# Patient Record
Sex: Female | Born: 1935
Health system: Southern US, Community
[De-identification: ages and names within clinical notes are randomized; demographics above are authoritative.]

## PROBLEM LIST (undated history)

## (undated) DIAGNOSIS — E669 Obesity, unspecified: Secondary | ICD-10-CM

## (undated) DIAGNOSIS — IMO0001 Reserved for inherently not codable concepts without codable children: Secondary | ICD-10-CM

## (undated) DIAGNOSIS — E039 Hypothyroidism, unspecified: Secondary | ICD-10-CM

## (undated) DIAGNOSIS — S72309A Unspecified fracture of shaft of unspecified femur, initial encounter for closed fracture: Secondary | ICD-10-CM

## (undated) DIAGNOSIS — M722 Plantar fascial fibromatosis: Secondary | ICD-10-CM

## (undated) DIAGNOSIS — J45901 Unspecified asthma with (acute) exacerbation: Secondary | ICD-10-CM

## (undated) DIAGNOSIS — M779 Enthesopathy, unspecified: Secondary | ICD-10-CM

## (undated) DIAGNOSIS — E041 Nontoxic single thyroid nodule: Secondary | ICD-10-CM

## (undated) DIAGNOSIS — R269 Unspecified abnormalities of gait and mobility: Secondary | ICD-10-CM

## (undated) DIAGNOSIS — M810 Age-related osteoporosis without current pathological fracture: Secondary | ICD-10-CM

## (undated) DIAGNOSIS — R7989 Other specified abnormal findings of blood chemistry: Secondary | ICD-10-CM

## (undated) DIAGNOSIS — Z90710 Acquired absence of both cervix and uterus: Secondary | ICD-10-CM

## (undated) DIAGNOSIS — M25579 Pain in unspecified ankle and joints of unspecified foot: Secondary | ICD-10-CM

## (undated) DIAGNOSIS — R5383 Other fatigue: Secondary | ICD-10-CM

## (undated) DIAGNOSIS — I10 Essential (primary) hypertension: Secondary | ICD-10-CM

## (undated) DIAGNOSIS — R0602 Shortness of breath: Secondary | ICD-10-CM

## (undated) DIAGNOSIS — E079 Disorder of thyroid, unspecified: Secondary | ICD-10-CM

## (undated) DIAGNOSIS — R942 Abnormal results of pulmonary function studies: Secondary | ICD-10-CM

## (undated) DIAGNOSIS — I341 Nonrheumatic mitral (valve) prolapse: Secondary | ICD-10-CM

## (undated) DIAGNOSIS — M1990 Unspecified osteoarthritis, unspecified site: Secondary | ICD-10-CM

## (undated) DIAGNOSIS — I358 Other nonrheumatic aortic valve disorders: Secondary | ICD-10-CM

## (undated) DIAGNOSIS — E785 Hyperlipidemia, unspecified: Secondary | ICD-10-CM

## (undated) DIAGNOSIS — I4819 Other persistent atrial fibrillation: Secondary | ICD-10-CM

## (undated) HISTORY — DX: Nontoxic single thyroid nodule: E04.1

## (undated) HISTORY — DX: Other specified abnormal findings of blood chemistry: R79.89

## (undated) HISTORY — PX: TONSILLECTOMY: SUR1361

## (undated) HISTORY — DX: Reserved for inherently not codable concepts without codable children: IMO0001

## (undated) HISTORY — DX: Unspecified abnormalities of gait and mobility: R26.9

## (undated) HISTORY — DX: Plantar fascial fibromatosis: M72.2

## (undated) HISTORY — DX: Other nonrheumatic aortic valve disorders: I35.8

## (undated) HISTORY — DX: Age-related osteoporosis without current pathological fracture: M81.0

## (undated) HISTORY — DX: Nonrheumatic mitral (valve) prolapse: I34.1

## (undated) HISTORY — DX: Pain in unspecified ankle and joints of unspecified foot: M25.579

## (undated) HISTORY — DX: Obesity, unspecified: E66.9

## (undated) HISTORY — DX: Hypothyroidism, unspecified: E03.9

## (undated) HISTORY — DX: Abnormal results of pulmonary function studies: R94.2

## (undated) HISTORY — DX: Hyperlipidemia, unspecified: E78.5

## (undated) HISTORY — DX: Essential (primary) hypertension: I10

## (undated) HISTORY — DX: Unspecified fracture of shaft of unspecified femur, initial encounter for closed fracture: S72.309A

## (undated) HISTORY — DX: Disorder of thyroid, unspecified: E07.9

## (undated) HISTORY — DX: Unspecified osteoarthritis, unspecified site: M19.90

## (undated) HISTORY — DX: Other fatigue: R53.83

## (undated) HISTORY — DX: Acquired absence of both cervix and uterus: Z90.710

## (undated) HISTORY — DX: Shortness of breath: R06.02

## (undated) HISTORY — DX: Unspecified asthma with (acute) exacerbation: J45.901

## (undated) HISTORY — DX: Other persistent atrial fibrillation: I48.19

## (undated) HISTORY — DX: Enthesopathy, unspecified: M77.9

---

## 1974-08-19 HISTORY — PX: OTHER SURGICAL HISTORY: SHX169

## 1974-08-19 HISTORY — PX: FRACTURE SURGERY: SHX138

## 1998-08-10 ENCOUNTER — Ambulatory Visit (HOSPITAL_COMMUNITY): Admission: RE | Admit: 1998-08-10 | Discharge: 1998-08-10 | Payer: Self-pay | Admitting: Internal Medicine

## 1998-08-10 ENCOUNTER — Encounter (HOSPITAL_BASED_OUTPATIENT_CLINIC_OR_DEPARTMENT_OTHER): Payer: Self-pay | Admitting: Internal Medicine

## 1998-08-28 ENCOUNTER — Other Ambulatory Visit: Admission: RE | Admit: 1998-08-28 | Discharge: 1998-08-28 | Payer: Self-pay | Admitting: Internal Medicine

## 1999-08-20 HISTORY — PX: ABDOMINAL HYSTERECTOMY: SHX81

## 1999-09-10 ENCOUNTER — Other Ambulatory Visit: Admission: RE | Admit: 1999-09-10 | Discharge: 1999-09-10 | Payer: Self-pay | Admitting: Internal Medicine

## 1999-09-18 ENCOUNTER — Encounter (HOSPITAL_BASED_OUTPATIENT_CLINIC_OR_DEPARTMENT_OTHER): Payer: Self-pay | Admitting: Internal Medicine

## 1999-09-18 ENCOUNTER — Ambulatory Visit (HOSPITAL_COMMUNITY): Admission: RE | Admit: 1999-09-18 | Discharge: 1999-09-18 | Payer: Self-pay | Admitting: Internal Medicine

## 2000-08-20 ENCOUNTER — Other Ambulatory Visit: Admission: RE | Admit: 2000-08-20 | Discharge: 2000-08-20 | Payer: Self-pay | Admitting: Gynecology

## 2000-08-20 ENCOUNTER — Encounter (INDEPENDENT_AMBULATORY_CARE_PROVIDER_SITE_OTHER): Payer: Self-pay

## 2000-08-26 ENCOUNTER — Ambulatory Visit: Admission: RE | Admit: 2000-08-26 | Discharge: 2000-08-26 | Payer: Self-pay | Admitting: Gynecology

## 2000-09-01 ENCOUNTER — Encounter: Payer: Self-pay | Admitting: Gynecology

## 2000-09-03 ENCOUNTER — Inpatient Hospital Stay (HOSPITAL_COMMUNITY): Admission: RE | Admit: 2000-09-03 | Discharge: 2000-09-05 | Payer: Self-pay | Admitting: Gynecology

## 2000-09-03 ENCOUNTER — Encounter (INDEPENDENT_AMBULATORY_CARE_PROVIDER_SITE_OTHER): Payer: Self-pay | Admitting: *Deleted

## 2000-10-07 ENCOUNTER — Ambulatory Visit (HOSPITAL_COMMUNITY): Admission: RE | Admit: 2000-10-07 | Discharge: 2000-10-07 | Payer: Self-pay | Admitting: Internal Medicine

## 2000-10-07 ENCOUNTER — Encounter (HOSPITAL_BASED_OUTPATIENT_CLINIC_OR_DEPARTMENT_OTHER): Payer: Self-pay | Admitting: Internal Medicine

## 2000-12-14 ENCOUNTER — Emergency Department (HOSPITAL_COMMUNITY): Admission: EM | Admit: 2000-12-14 | Discharge: 2000-12-14 | Payer: Self-pay | Admitting: Emergency Medicine

## 2001-08-19 HISTORY — PX: SKIN CANCER EXCISION: SHX779

## 2004-08-15 DIAGNOSIS — I10 Essential (primary) hypertension: Secondary | ICD-10-CM

## 2004-08-15 DIAGNOSIS — M810 Age-related osteoporosis without current pathological fracture: Secondary | ICD-10-CM

## 2004-08-15 HISTORY — DX: Age-related osteoporosis without current pathological fracture: M81.0

## 2004-08-15 HISTORY — DX: Essential (primary) hypertension: I10

## 2004-08-19 HISTORY — PX: BASAL CELL CARCINOMA EXCISION: SHX1214

## 2005-12-02 DIAGNOSIS — J45901 Unspecified asthma with (acute) exacerbation: Secondary | ICD-10-CM

## 2005-12-02 HISTORY — DX: Unspecified asthma with (acute) exacerbation: J45.901

## 2008-09-27 HISTORY — PX: COLONOSCOPY: SHX174

## 2008-09-27 LAB — HM COLONOSCOPY: HM COLON: NORMAL

## 2008-10-09 DIAGNOSIS — R269 Unspecified abnormalities of gait and mobility: Secondary | ICD-10-CM

## 2008-10-09 HISTORY — DX: Unspecified abnormalities of gait and mobility: R26.9

## 2008-12-28 DIAGNOSIS — M25579 Pain in unspecified ankle and joints of unspecified foot: Secondary | ICD-10-CM

## 2008-12-28 HISTORY — DX: Pain in unspecified ankle and joints of unspecified foot: M25.579

## 2009-03-19 DIAGNOSIS — M779 Enthesopathy, unspecified: Secondary | ICD-10-CM

## 2009-03-19 HISTORY — DX: Enthesopathy, unspecified: M77.9

## 2010-06-25 DIAGNOSIS — R7989 Other specified abnormal findings of blood chemistry: Secondary | ICD-10-CM

## 2010-06-25 HISTORY — DX: Other specified abnormal findings of blood chemistry: R79.89

## 2010-06-28 ENCOUNTER — Encounter: Admission: RE | Admit: 2010-06-28 | Discharge: 2010-06-28 | Payer: Self-pay | Admitting: Cardiology

## 2010-06-28 ENCOUNTER — Ambulatory Visit: Payer: Self-pay | Admitting: Cardiology

## 2010-07-02 LAB — HM DEXA SCAN

## 2010-07-10 ENCOUNTER — Ambulatory Visit: Payer: Self-pay | Admitting: Cardiology

## 2010-07-16 ENCOUNTER — Inpatient Hospital Stay (HOSPITAL_BASED_OUTPATIENT_CLINIC_OR_DEPARTMENT_OTHER): Admission: RE | Admit: 2010-07-16 | Discharge: 2010-07-16 | Payer: Self-pay | Admitting: Cardiology

## 2010-07-16 ENCOUNTER — Ambulatory Visit: Payer: Self-pay | Admitting: Cardiovascular Disease

## 2010-07-20 ENCOUNTER — Encounter: Admission: RE | Admit: 2010-07-20 | Discharge: 2010-07-20 | Payer: Self-pay | Admitting: Cardiology

## 2010-08-06 DIAGNOSIS — R942 Abnormal results of pulmonary function studies: Secondary | ICD-10-CM

## 2010-08-06 DIAGNOSIS — E041 Nontoxic single thyroid nodule: Secondary | ICD-10-CM

## 2010-08-06 DIAGNOSIS — IMO0001 Reserved for inherently not codable concepts without codable children: Secondary | ICD-10-CM

## 2010-08-06 DIAGNOSIS — E039 Hypothyroidism, unspecified: Secondary | ICD-10-CM

## 2010-08-06 HISTORY — DX: Reserved for inherently not codable concepts without codable children: IMO0001

## 2010-08-06 HISTORY — DX: Abnormal results of pulmonary function studies: R94.2

## 2010-08-06 HISTORY — DX: Hypothyroidism, unspecified: E03.9

## 2010-08-06 HISTORY — DX: Nontoxic single thyroid nodule: E04.1

## 2010-08-07 ENCOUNTER — Encounter: Payer: Self-pay | Admitting: Pulmonary Disease

## 2010-08-19 HISTORY — PX: FRACTURE SURGERY: SHX138

## 2010-08-28 DIAGNOSIS — M810 Age-related osteoporosis without current pathological fracture: Secondary | ICD-10-CM | POA: Insufficient documentation

## 2010-08-28 DIAGNOSIS — I10 Essential (primary) hypertension: Secondary | ICD-10-CM | POA: Insufficient documentation

## 2010-08-28 DIAGNOSIS — IMO0001 Reserved for inherently not codable concepts without codable children: Secondary | ICD-10-CM | POA: Insufficient documentation

## 2010-08-28 DIAGNOSIS — R5381 Other malaise: Secondary | ICD-10-CM | POA: Insufficient documentation

## 2010-08-28 DIAGNOSIS — R5383 Other fatigue: Secondary | ICD-10-CM

## 2010-08-28 DIAGNOSIS — R0602 Shortness of breath: Secondary | ICD-10-CM | POA: Insufficient documentation

## 2010-08-28 DIAGNOSIS — E039 Hypothyroidism, unspecified: Secondary | ICD-10-CM | POA: Insufficient documentation

## 2010-08-28 DIAGNOSIS — E7800 Pure hypercholesterolemia, unspecified: Secondary | ICD-10-CM | POA: Insufficient documentation

## 2010-08-29 ENCOUNTER — Ambulatory Visit
Admission: RE | Admit: 2010-08-29 | Discharge: 2010-08-29 | Payer: Self-pay | Source: Home / Self Care | Attending: Pulmonary Disease | Admitting: Pulmonary Disease

## 2010-08-29 DIAGNOSIS — R0600 Dyspnea, unspecified: Secondary | ICD-10-CM | POA: Insufficient documentation

## 2010-09-12 ENCOUNTER — Ambulatory Visit
Admission: RE | Admit: 2010-09-12 | Discharge: 2010-09-12 | Payer: Self-pay | Source: Home / Self Care | Attending: Pulmonary Disease | Admitting: Pulmonary Disease

## 2010-09-12 ENCOUNTER — Encounter: Payer: Self-pay | Admitting: Pulmonary Disease

## 2010-09-20 NOTE — Assessment & Plan Note (Addendum)
Summary: rov for review of pfts   Copy to:  Murray Hodgkins MD Primary Provider/Referring Provider:  Murray Hodgkins MD  CC:  Ov to discuss PFT results. .  History of Present Illness: the pt comes in today for her full pfts, as part of a w/u for doe.  She was surprisingly found to have mild airflow obstruction, but normal lung volumes and essentially normal DLCO.  I have reviewed the results with her in detail, and answered all of her questions.    Medications Prior to Update: 1)  Multivitamins  Tabs (Multiple Vitamin) .... Once Daily 2)  Calcium-Vitamin D 500-200 Mg-Unit Tabs (Calcium Carbonate-Vitamin D) .Marland Kitchen.. 1 Two Times A Day With Food 3)  Fish Oil 1000 Mg Caps (Omega-3 Fatty Acids) .... Once Daily 4)  Hydrochlorothiazide 25 Mg Tabs (Hydrochlorothiazide) .... Once Daily 5)  Vitamin D3 1000 Unit Caps (Cholecalciferol) .... Once Daily 6)  Levothyroxine Sodium 25 Mcg Tabs (Levothyroxine Sodium) .... Once Daily 7)  Lipitor 20 Mg Tabs (Atorvastatin Calcium) .... Once Daily 8)  Aspirin 325 Mg Tabs (Aspirin) .... As Needed 9)  Advil 200 Mg Tabs (Ibuprofen) .... As Needed  Allergies (verified): No Known Drug Allergies  Review of Systems       The patient complains of shortness of breath with activity, hand/feet swelling, and joint stiffness or pain.  The patient denies shortness of breath at rest, productive cough, non-productive cough, coughing up blood, chest pain, irregular heartbeats, acid heartburn, indigestion, loss of appetite, weight change, abdominal pain, difficulty swallowing, sore throat, tooth/dental problems, headaches, nasal congestion/difficulty breathing through nose, sneezing, itching, ear ache, anxiety, depression, rash, change in color of mucus, and fever.    Vital Signs:  Patient profile:   77 year old female Height:      63 inches Weight:      178 pounds BMI:     31.65 O2 Sat:      95 % on Room air Temp:     97.5 degrees F oral Pulse rate:   66 / minute BP sitting:    134 / 82  (left arm) Cuff size:   large  Vitals Entered By: Arman Filter LPN (September 12, 2010 11:02 AM)  O2 Flow:  Room air CC: Ov to discuss PFT results.  Comments Medications reviewed with patient Arman Filter LPN  September 12, 2010 11:04 AM    Physical Exam  General:  obese female in nad Nose:  no purulence or discharge noted. Extremities:  minimal edema, on cyanosis Neurologic:  alert and oriented, moves all 4.   Impression & Recommendations:  Problem # 1:  DYSPNEA ON EXERTION (ICD-786.09)  the pt has mild airflow obstruction on her pfts today, and this could be either to senile emphysema, exposures in the past, or possibly occult asthma.  It is unclear how much this is really contributing to her symptom of doe.  She is also obese and decondtioned.  The only way we can see how much this is contributing to her overall symptomatology is to treat her the finding and see how much she improves.  She will need to decide if the inhaler does help, and also if it helps to a degree that justifies a chronic medication.  I have also encouraged her to work on weight loss and improved conditioning.  Other Orders: Est. Patient Level III (10272)  Patient Instructions: 1)  will try dulera 100/5  2 inhalations am and pm...rinse mouth well.  Do this for next 3 weeks, and  give me feedback if this is helping. 2)  work on weight loss and conditioning.    Appended Document: rov for review of pfts patient saw no change with dulera, therefore her mild obstruction is not the issue with her doe.  I have asked her to work on weight loss and conditioning.

## 2010-09-20 NOTE — Assessment & Plan Note (Signed)
Summary: consult for Cheyenne Cruz   Visit Type:  Initial Consult Copy to:  Murray Hodgkins MD Primary Provider/Referring Provider:  Murray Hodgkins MD  CC:  Pulmonary consult. pt states she has increased sob with activity x 2 years.  History of Present Illness: The pt is a 77y/o female who I have been asked to see for dyspnea.  She has noted worsening sob over the past 5 yrs, and currently feels it is with more moderate to severe exertional activities.  She will get winded walking up hills and stairs, but denies issues with ADL's and bringing groceries in from the car.  She does admit that she has decreased her activity level to match her symptoms.  She denies any cough or mucus production, and has never smoked.  Her weight has been fairly neutral over the last few years.  She has no h/o anemia, and a recent TSH was mildly elevated.  She had a negative right and left heart cath in 2011.  She has had a ct chest in Dec 2011 which showed a few tiny nodular densities and a 1cm calcified granuloma, but the pt did live in the [place] for most of her life.  She was also ? exposed to TB thru her husband, who apparently had +ppd and treated with INH in the 50's.  Preventive Screening-Counseling & Management  Alcohol-Tobacco     Smoking Status: never  Current Medications (verified): 1)  Multivitamins  Tabs (Multiple Vitamin) .... Once Daily 2)  Calcium-Vitamin D 500-200 Mg-Unit Tabs (Calcium Carbonate-Vitamin D) .Marland Kitchen.. 1 Two Times A Day With Food 3)  Fish Oil 1000 Mg Caps (Omega-3 Fatty Acids) .... Once Daily 4)  Hydrochlorothiazide 25 Mg Tabs (Hydrochlorothiazide) .... Once Daily 5)  Vitamin D3 1000 Unit Caps (Cholecalciferol) .... Once Daily 6)  Levothyroxine Sodium 25 Mcg Tabs (Levothyroxine Sodium) .... Once Daily 7)  Lipitor 20 Mg Tabs (Atorvastatin Calcium) .... Once Daily 8)  Aspirin 325 Mg Tabs (Aspirin) .... As Needed 9)  Advil 200 Mg Tabs (Ibuprofen) .... As Needed  Allergies (verified): No Known Drug  Allergies  Past History:  Past Medical History:  HYPERTENSION (ICD-401.9) HYPOTHYROIDISM (ICD-244.9) SENILE OSTEOPOROSIS (ICD-733.01) HYPERCHOLESTEROLEMIA (ICD-272.0) pulmonary nodules (longstanding)--felt to be due to possible TB or old histo hx of basal cell skin cancer  Past Surgical History: hysterectomy  Family History: Reviewed history and no changes required. hypertension: father breast cancer: mgm  Social History: Reviewed history and no changes required. Patient never smoked.  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  The patient denies shortness of breath at rest, productive cough, non-productive cough, coughing up blood, chest pain, loss of appetite, weight change, abdominal pain, difficulty swallowing, sore throat, tooth/dental problems, headaches, nasal congestion/difficulty breathing through nose, sneezing, itching, ear ache, anxiety, depression, rash, change in color of mucus, and fever.    Vital Signs:  Patient profile:   77 year old female Height:      62.5 inches Weight:      180.25 pounds BMI:     32.56 O2 Sat:      97 % on Room air Temp:     98.1 degrees F oral Pulse rate:   77 / minute BP sitting:   132 / 82  (left arm) Cuff size:   large  Vitals Entered By: Hali Marry  Silva (August 29, 2010 10:26 AM)  O2 Flow:  Room air CC: Pulmonary consult. pt states she has increased sob with activity x 2 years Comments meds and allergies updated Phone number updated Carver Fila  August 29, 2010 10:26 AM    Physical Exam  General:  obese female in nad Eyes:  PERRLA and EOMI.   Nose:  patent without discharge Mouth:  no lesions or exudates noted. Neck:  no jvd, tmg, LN Lungs:  totally clear to auscultation no wheezing or rhonchi Heart:  rrr, no  mrg Abdomen:  soft and nontender, bs+ Extremities:  mild RLE edema in ankle, none on left no cyanosis pulses intact distally Neurologic:  alert and oriented, moves all 4.   Impression & Recommendations:  Problem # 1:  DYSPNEA ON EXERTION (ICD-786.09) the pt has Cheyenne Cruz that occurs primarily with moderate to heavier exertional activities.  She has had a negative cardiac evaluation, and there is nothing by history or exam to suggest a pulmonary issue.  At this point, would like to do full pfts for evaluation.  If unremarkable, suspect this is due to her obesity and some degree of deconditioning.  There is nothing to suggest thromboembolic disease either.  Medications Added to Medication List This Visit: 1)  Lipitor 20 Mg Tabs (Atorvastatin calcium) .... Once daily 2)  Aspirin 325 Mg Tabs (Aspirin) .... As needed 3)  Advil 200 Mg Tabs (Ibuprofen) .... As needed  Other Orders: Consultation Level IV (16109) Pulmonary Referral (Pulmonary)  Patient Instructions: 1)  will schedule for breathing studies to evaluate your shortness of breath, and will see back the same day to review 2)  work on weight loss and conditioning.    Immunization History:  Influenza Immunization History:    Influenza:  historical (04/19/2010)  Pneumovax Immunization History:    Pneumovax:  historical (05/19/2008)

## 2010-09-20 NOTE — Miscellaneous (Signed)
Summary: Orders Update pft charges   Clinical Lists Changes  Orders: Added new Service order of Carbon Monoxide diffusing w/capacity (94729) - Signed Added new Service order of Lung Volumes/Gas dilution or washout (94727) - Signed Added new Service order of Spirometry (Pre & Post) (94060) - Signed 

## 2010-10-03 ENCOUNTER — Telehealth: Payer: Self-pay | Admitting: Pulmonary Disease

## 2010-10-10 NOTE — Progress Notes (Signed)
Summary: update re: dulera use   Phone Note Call from Patient Call back at Home Phone [phone]   Caller: Patient Call For: Cheyenne Cruz Summary of Call: pt called (as instructed at last ov) to let dr Taria Castrillo know that the sample of dulera did not work for her at all. she saw "no improvement". Initial call taken by: Tivis Ringer, CNA,  October 03, 2010 3:05 PM  Follow-up for Phone Call        called spoke with patient who confirms that she has been using the dulera 2 puffs two times a day and does not feel that the dulera has not helped her breathing at all.  still having some DOE, though she does not feel that it has worsened.  states she has been exercising 5x a week using the treadmill and "step machine."  denies any increased in SOB w/ the exercise.  Dr. Shelle Iron, please advise of any recs, thanks. Boone Master CNA/MA  October 03, 2010 4:03 PM   Additional Follow-up for Phone Call Additional follow up Details #1::        If she tried the dulera faithfully for a peroid of time and saw no difference,then the abnormality on her breathing studies are not the issue for her  shortness of breath.  Would stop all inhalers, work on weight reduction.  If her breathing worsens, would be happy to re-evaluate.   Additional Follow-up by: Barbaraann Share MD,  October 03, 2010 4:58 PM    Additional Follow-up for Phone Call Additional follow up Details #2::    called and spoke with pt about [facility] recs---pt voiced her understanding of this and will stop the dulera and work on weight reduction and will call if not better. Randell Loop CMA  October 03, 2010 5:18 PM

## 2010-10-30 LAB — POCT I-STAT 3, ART BLOOD GAS (G3+)
Acid-Base Excess: 2 mmol/L (ref 0.0–2.0)
Bicarbonate: 27.2 mEq/L — ABNORMAL HIGH (ref 20.0–24.0)

## 2010-10-30 LAB — POCT I-STAT 3, VENOUS BLOOD GAS (G3P V)
Acid-Base Excess: 1 mmol/L (ref 0.0–2.0)
pO2, Ven: 39 mmHg (ref 30.0–45.0)

## 2011-01-04 NOTE — Consult Note (Signed)
[facility]  Patient:    Cheyenne Cruz, Cheyenne Cruz                       MRN: [id number] Attending:  Rande Brunt. Stanford Breed, M.D. CC:         Gretta Cool, M.D.  Jonelle Sports. Cheryll Cockayne, M.D.  Jerel Shepherd, R.N.   Consultation Report  HISTORY OF PRESENT ILLNESS:  A 77 year old white married female, gravida 3, referred by Dr. Beather Arbour for evaluation and consultation of an uterine mass.  The patient initially noted prolapse symptoms, but subsequently developed some vaginal bleeding which was evaluated by Dr. Nicholas Lose.  She was found on ultrasound to have a partially cystic and solid mass within the endometrial cavity measuring 2.5 cm, and a very thin endometrium.  An endometrial biopsy was obtained, revealing focal complex hyperplasia without atypia and evidence of a benign endometrial polyp.  She continues to have some slight vaginal bleeding.  She denies any pelvic pain or pressure or any significant GI or GU symptoms.  Her functional status is excellent.  PAST MEDICAL HISTORY:  Mitral valve prolapse.  Occasional palpitations.  PAST SURGICAL HISTORY:  Foot surgery after a foot fracture.  CURRENT MEDICATIONS:  Fosamax 70 mg daily.  ALLERGIES:  No known drug allergies.  SOCIAL HISTORY:  The patients husband is the former Economist of [employer].  She spends her time between her summer home in [place] and winter home in [place].  Her husband is now retired.  FAMILY HISTORY:  Negative for gynecologic, breast, or colon cancer.  REVIEW OF SYSTEMS:  Normal.  PHYSICAL EXAMINATION:  VITAL SIGNS:  Height 5 feet 2.5 inches, weight 165 pounds, blood pressure 150/88, pulse 70, respiratory rate 18.  GENERAL:  The patient is a healthy white female in no acute distress.  HEENT:  Negative.  NECK:  Supple, without thyromegaly.  ABDOMEN:  Soft, nontender, no masses, organomegaly, ascites, or hernias are noted.  PELVIC:  EGBUS normal.  Vagina has  some blood in it coming from the cervical os.  The cervix is normal.  Uterus is anterior, normal size, shape, and consistency.  IMPRESSION:  Ultrasound findings revealing a probable endometrial polyp which is very large with a very thin myometrium.  PLAN:  I had a lengthy discussion with the patient and Dr. Nicholas Lose regarding management options.  The patient has elected to have an abdominal hysterectomy and bilateral salpingo-oophorectomy, culdoplasty, and vaginal vault suspension in order to manage both her bleeding, the polyp, and her prolapse symptoms. She is aware that D&C could be performed.  She is also aware that D&C might have an increased risk of complications, given the thin myometrium which would be reasonably easy to perforate.  After considering all of these options, the patient continues to wish to have abdominal hysterectomy and bilateral salpingo-oophorectomy.  She is aware that if a true cancer was found, we would proceed with considering pelvic and ______ lymphadenopathy depending upon depth of invasion.  All of her questions were answered, and she wishes to proceed with surgery on September 03, 2000. DD:  08/26/00 TD:  08/26/00 Job: [id number]

## 2011-01-04 NOTE — Op Note (Signed)
[facility]  Patient:    Cheyenne Cruz, Cheyenne Cruz                    MRN: [id number] Proc. Date: 09/03/00 Adm. Date:  [id number] Disc. Date: [id number] Attending:  Jeannette Corpus CC:         Jonelle Sports. Cheryll Cockayne, M.D.  Daniel L. Clarke-Pearson, M.D.   Operative Report  PREOPERATIVE DIAGNOSES 1. Large endometrial polyp highly suspicious of endometrial adenocarcinoma,    with dramatic thinning of the myometrial wall. 2. Complex hyperplasia. 3. Uterine descensus with enterocele formation.  POSTOPERATIVE DIAGNOSES 1. Very large endometrial polyp greatly thinning the myometrial wall. 2. Complex hyperplasia. 3. Uterine descensus with enterocele formation. 4. Frozen section showing complex hyperplasia but no evidence of malignancy,    pending fixed permanents.  PROCEDURE:  Total abdominal hysterectomy, bilateral salpingo-oophorectomy, vaginal vault suspension and Halban culdoplasty.  CO-SURGEONS Gretta Cool, M.D. Daniel L. Clarke-Pearson, M.D.  ANESTHESIA:  General endotracheal.  DESCRIPTION OF PROCEDURE:  Under excellent general anesthesia, with the patients abdomen prepped and draped as a sterile field, with Foley catheter draining her bladder, in Allen stirrups, with PAS stockings, a Pfannenstiel incision was made.  The Pfannenstiel incision was extended through the fascia, rectus muscles separated in the midline and the peritoneum opened.  Upon opening the peritoneal cavity, washings were obtained.  The abdomen was then explored; there was no evidence of abnormality in the upper abdomen.  The para-aortic nodes were negative or not enlarged.  Pelvic nodes were not enlarged.  The uterus was quite large for her age, appearing more typical of a premenopausal uterus.  There were some adhesions to the posterior aspect of the uterus and lateral pelvic wall of the colon suggesting previous diverticulitis.  There was no evidence of endometriosis, no  external disease or evidence of metastatic disease.  The uterus was then elevated with Kelly clamps across the adnexal structures.  The round ligaments were then transected and the anterior leaf of the broad ligament opened and pushed off the lower uterine segment; the posterior leaf was then also opened and the ureters identified on each side.  The infundibulopelvic vessels were then isolated from the ureters, clamped, cut, sutured and tied with 0 Vicryl.  A second free tie of 2-0 Vicryl was used to doubly ligate the pedicle.  The uterine vessels were then skeletonized, clamped, cut, sutured and tied with 0 Vicryl.  The cardinal and uterosacral ligaments were then progressively clamped, cut, sutured and tied with 2-0 Vicryl.  The uterosacral and cardinal ligaments were identified and isolated with hemostats.  The uterosacral ligaments were also identified and isolated with individual sutures in the depth of the pelvis for further use for uterosacral/cardinal/vaginal vault suspension.  The vagina was then entered and the cervix excised.  The vagina was then closed with a running suture of 2-0 Vicryl.  The vaginal vault was then suspended by suture of the uterosacral ligament as far posteriorly as possible as it could be isolated and a suture of 0 Ethibond placed through the uterosacral, then through the cardinal and uterosacral suspension and through the vaginal cuff and through the anterior vaginal wall, full-thickness; the sutures were placed symmetrically on each side.  As they were tied, the vaginal cuff was suspended in a high posterior position.  At this point, the thin and somewhat weak anterior vaginal wall fascia was plicated with a running suture of 2-0 Vicryl after advancement of the bladder.  The enterocele was then  closed with interrupted sutures of 2-0 Vicryl full-thickness through the posterior vaginal wall, down the length of the cul-de-sac and to the serosa of the colon, so  as to completely obliterate the cul-de-sac and to eliminate the enterocele.  At this point, the cuff was irrigated and all the pedicles examined.  A few small bleeding points were cauterized on the peritoneal edge.  At this point, the pelvic floor was entirely dry.  The packs were then removed and the pelvis once again irrigated.  The pelvic peritoneum was not approximated.  The abdominal peritoneum was then closed with a running suture of 2-0 Vicryl from the umbilicus to the pubic symphysis.  The rectus muscles that were widely separated were then approximated in the midline with a running suture of 2-0 Vicryl.  Fascia was then approximated with a running suture of 0 Vicryl from each angle to the midline, subcutaneous tissues approximated with interrupted sutures of 2-0 Vicryl and the skin closed with skin staples and Steri-Strips.  At the end of the procedure, sponge and lap counts were correct.  There were no complications.  Patient returned to the recovery room in excellent condition. DD:  09/03/00 TD:  09/03/00 Job: [id number]

## 2011-01-04 NOTE — H&P (Signed)
[facility]  Patient:    Cheyenne Cruz, Cheyenne Cruz                       MRN: [id number] Attending:  Gretta Cool, M.D. CC:         Jonelle Sports. Cheryll Cockayne, M.D.  Daniel L. Clarke-Pearson, M.D.   History and Physical  CHIEF COMPLAINT:  Abnormal uterine lining and abnormal bleeding.  HISTORY OF PRESENT ILLNESS:  A 77 year old white married female, gravida 3, para 3, under the primary care of Dr. Lillia Mountain and referred for evaluation of abnormal uterine bleeding and uterine descent.  She also has minor stress incontinence with full bladder, but does not require pads.  She does have some significant urgency to void as well.  Her main concern is history of spotting that lasted two days the end of November.  She has not had premenstrual spotting before.  She has been on no hormone replacement therapy.  Her last menstrual period was in 1982 when she experienced uneventful menopause.  Her obstetric history is also unremarkable for vaginal deliveries without significant complication.  She has a history of osteoporosis and is on Fosamax.  No other medical therapy.  PAST MEDICAL HISTORY:  Usual childhood disease without sequelae.  PRESENT MEDICATION:  Fosamax 70 mg once weekly.  ALLERGIES:  None known.  SOCIAL HISTORY:  Habits:  Denies ethanol or tobacco use.  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  FAMILY HISTORY:  Father 12 with history of small strokes and long-term hypertension.  Mother had cerebral hemorrhage during repair of hip fracture. She has no siblings.  REVIEW OF SYSTEMS:  HEENT:  Denies symptoms.  CARDIORESPIRATORY:  Denies asthma, cough, bronchitis, shortness of breath.  GI/GU:  Denies frequency, urgency, dysuria, change in bowel habits, food intolerance.  PHYSICAL EXAMINATION:  GENERAL APPEARANCE:  A well-developed, well-nourished white female.  HEENT:  PERRLA.  Fundi not examined.  Oropharynx  clear.  NECK:  Supple without mass or thyroid enlargement.  CHEST:  Clear to percussion and auscultation.  CARDIOVASCULAR:  Regular.  Mitral valve prolapse by history, but I cannot appreciate murmur or mitral closing sound or mitral opening snap.  No evidence of cardiomegaly.  BREASTS:  Without mass, nodes, or nipple discharge.  ABDOMEN:  Soft, scaphoid without mass or organomegaly.  PELVIC:  External genitalia:  Normal female.  Vagina:  Thin, atrophic with reasonably well preserved pelvic support except apical support which is weak with grade 2 uterine descensus.  Her uterus is upper limits of normal size. Adnexa clear.  Rectovaginal confirms.  EXTREMITIES:  Negative.  NEUROLOGIC:  Physiologic.  LABORATORY AND X-RAY DATA:  Ultrasound examination reveals a retroflexed uterus measuring 6.5 x 4.0 x 5.0 cm.  Her endometrium measures 25.9 mm.  There are large cystic areas within the cavity.  There is very minimal myometrium surrounding the endometrial tissue.  Her right ovary is small; left ovary is not seen.  Impression on ultrasound examination:  Endometrial hyperplasia versus endometrial adenocarcinoma or polyp highly suspicious for invasive adenocarcinoma of the endometrium.  Rectovaginal exam confirms.  IMPRESSION: 1. Postmenopausal bleeding with massive thickening of the endometrial lining    by ultrasound suspicious of endometrial adenocarcinoma.  Recommended    immediate sampling then on to consultation with Dr. Stanford Breed and    definitive therapy based on findings. 2. Osteoporosis.  DISCUSSION:  I have done endometrial sampling which revealed complex hyperplasia without atypia and a large  amount of tissue from an endometrial polyp.  Careful examination and reexamine by Dr. Guilford Shi suggested no evidence of malignancy.  I have discussed resampling, D&C, hysteroscopy, and resection of the polyp.  My concerns with that approach are a very thin wall of the uterus and  risk of perforation with only 2-3 mm of thickness at myometrium.  I have also discussed the primary therapy by hysterectomy and then pathology consultation and frozen section, then on to immediate therapy with Serita Kyle as cosurgeon with node sampling, etc. if indicated.  I have discussed all of the options with the patient, and she will see Dr. Stanford Breed in consultation on August 26, 2000.  Plan on to definitive therapy if he agrees. DD:  08/25/00 TD:  08/25/00 Job: [id number]

## 2011-07-03 DIAGNOSIS — S72309A Unspecified fracture of shaft of unspecified femur, initial encounter for closed fracture: Secondary | ICD-10-CM

## 2011-07-03 HISTORY — DX: Unspecified fracture of shaft of unspecified femur, initial encounter for closed fracture: S72.309A

## 2011-12-24 DIAGNOSIS — I10 Essential (primary) hypertension: Secondary | ICD-10-CM | POA: Diagnosis not present

## 2011-12-24 DIAGNOSIS — E7800 Pure hypercholesterolemia, unspecified: Secondary | ICD-10-CM | POA: Diagnosis not present

## 2011-12-24 DIAGNOSIS — E039 Hypothyroidism, unspecified: Secondary | ICD-10-CM | POA: Diagnosis not present

## 2011-12-25 DIAGNOSIS — E041 Nontoxic single thyroid nodule: Secondary | ICD-10-CM | POA: Diagnosis not present

## 2011-12-25 DIAGNOSIS — E7800 Pure hypercholesterolemia, unspecified: Secondary | ICD-10-CM | POA: Diagnosis not present

## 2011-12-25 DIAGNOSIS — I10 Essential (primary) hypertension: Secondary | ICD-10-CM | POA: Diagnosis not present

## 2011-12-25 DIAGNOSIS — E669 Obesity, unspecified: Secondary | ICD-10-CM | POA: Diagnosis not present

## 2012-01-01 DIAGNOSIS — Z1231 Encounter for screening mammogram for malignant neoplasm of breast: Secondary | ICD-10-CM | POA: Diagnosis not present

## 2012-01-12 ENCOUNTER — Encounter: Payer: Self-pay | Admitting: *Deleted

## 2012-06-02 DIAGNOSIS — Z23 Encounter for immunization: Secondary | ICD-10-CM | POA: Diagnosis not present

## 2012-06-25 DIAGNOSIS — E7800 Pure hypercholesterolemia, unspecified: Secondary | ICD-10-CM | POA: Diagnosis not present

## 2012-06-25 DIAGNOSIS — I10 Essential (primary) hypertension: Secondary | ICD-10-CM | POA: Diagnosis not present

## 2012-06-25 DIAGNOSIS — R0602 Shortness of breath: Secondary | ICD-10-CM | POA: Diagnosis not present

## 2012-06-25 DIAGNOSIS — E039 Hypothyroidism, unspecified: Secondary | ICD-10-CM | POA: Diagnosis not present

## 2012-06-25 DIAGNOSIS — E669 Obesity, unspecified: Secondary | ICD-10-CM | POA: Diagnosis not present

## 2012-07-01 DIAGNOSIS — E669 Obesity, unspecified: Secondary | ICD-10-CM | POA: Diagnosis not present

## 2012-07-01 DIAGNOSIS — M810 Age-related osteoporosis without current pathological fracture: Secondary | ICD-10-CM | POA: Diagnosis not present

## 2012-07-01 DIAGNOSIS — I10 Essential (primary) hypertension: Secondary | ICD-10-CM | POA: Diagnosis not present

## 2012-07-01 DIAGNOSIS — E039 Hypothyroidism, unspecified: Secondary | ICD-10-CM | POA: Diagnosis not present

## 2012-07-02 ENCOUNTER — Encounter: Payer: Self-pay | Admitting: Cardiology

## 2012-07-10 DIAGNOSIS — Z8262 Family history of osteoporosis: Secondary | ICD-10-CM | POA: Diagnosis not present

## 2012-07-10 DIAGNOSIS — M899 Disorder of bone, unspecified: Secondary | ICD-10-CM | POA: Diagnosis not present

## 2012-07-22 DIAGNOSIS — Z85828 Personal history of other malignant neoplasm of skin: Secondary | ICD-10-CM | POA: Diagnosis not present

## 2012-07-22 DIAGNOSIS — L821 Other seborrheic keratosis: Secondary | ICD-10-CM | POA: Diagnosis not present

## 2012-07-22 DIAGNOSIS — L570 Actinic keratosis: Secondary | ICD-10-CM | POA: Diagnosis not present

## 2012-07-22 DIAGNOSIS — L738 Other specified follicular disorders: Secondary | ICD-10-CM | POA: Diagnosis not present

## 2012-07-29 DIAGNOSIS — M899 Disorder of bone, unspecified: Secondary | ICD-10-CM | POA: Diagnosis not present

## 2012-07-29 DIAGNOSIS — M949 Disorder of cartilage, unspecified: Secondary | ICD-10-CM | POA: Diagnosis not present

## 2012-07-29 DIAGNOSIS — I10 Essential (primary) hypertension: Secondary | ICD-10-CM | POA: Diagnosis not present

## 2012-07-29 DIAGNOSIS — E669 Obesity, unspecified: Secondary | ICD-10-CM | POA: Diagnosis not present

## 2012-09-30 DIAGNOSIS — I10 Essential (primary) hypertension: Secondary | ICD-10-CM | POA: Diagnosis not present

## 2012-09-30 DIAGNOSIS — E669 Obesity, unspecified: Secondary | ICD-10-CM | POA: Diagnosis not present

## 2012-11-11 ENCOUNTER — Encounter: Payer: Self-pay | Admitting: Geriatric Medicine

## 2012-11-11 ENCOUNTER — Non-Acute Institutional Stay: Payer: Medicare Other | Admitting: Geriatric Medicine

## 2012-11-11 VITALS — BP 172/88 | HR 62 | Ht 61.0 in | Wt 190.0 lb

## 2012-11-11 DIAGNOSIS — E669 Obesity, unspecified: Secondary | ICD-10-CM | POA: Diagnosis not present

## 2012-11-11 DIAGNOSIS — H25019 Cortical age-related cataract, unspecified eye: Secondary | ICD-10-CM | POA: Diagnosis not present

## 2012-11-11 DIAGNOSIS — I10 Essential (primary) hypertension: Secondary | ICD-10-CM

## 2012-11-11 NOTE — Assessment & Plan Note (Addendum)
Pt has not been doing aerobic exercise as RX, has been doing strength training a few days/ week, has been 'pushing back' from the table a bit. Feels a bit stronger.   No weight loss. Encouraged combination of strength/ aerobic exercise to move toward goal of weight loss.

## 2012-11-11 NOTE — Assessment & Plan Note (Addendum)
Recent high readings in clinic. Pt has been measuring BP at home range for the last month 126-142/62-78. No medication change. Check lab in May

## 2012-11-11 NOTE — Progress Notes (Signed)
Patient ID: Cheyenne Cruz, female   DOB: 1928-12-03, 79 y.o.   MRN: [id number]  Chief Complaint  Patient presents with  . Medical Managment of Chronic Issues    obesity, blood pressure     HPI  This 79 year old female resident of [facility] ,returns to clinic today for followup of her blood pressure and obesity. At last visit, Exercise had not progressed as pt. planned; not doing much aerobic activity. Continued 3x/ week exercise class, made changes in diet (smaller portions, avoiding breads/ starches). Is more active in general; feels a little more energetic. Pt. remained focused on losing weight and getting ib etter shape so she may enjoy trip to [place] in late April. Today, pt reports she still has not committed to aerobic activity (winter weather, etc). No c/o about general stamina. Generally feels well, looking forward to Trip in April Has been checking BP at home, readings stable. High in clinic today.  Allergies Reviewed Medications Reviewed  Data Reviewed    Radiology: LAB [facility] (external results)  12/24/11 Glu 102, BUN 23, Cr. .98, Na 141, K+ 4.3   TC 183, TG 84, HDL 70, LDL 96   TSH 5.1  40/9/81  WBC 6.2, Hgb 13.8, Hct 40.8, Plt 266   Glu 115, BUN 23, Cr. .98, NA 140, k+ 4.2 TC 184  TG 85, HDL 76, LDL 91   TSH 3.87   Other:    REVIEW of SYSTEMS:  DATA OBTAINED: from patient,   GENERAL: Feels well. No fevers, fatigue, change in activity status. No change in appetite, weight or sleep. SKIN: No itch, rash or open wounds EYES: No eye pain, dryness or itching. No change in vision eyes dilated this AM for opth appt RESPIRATORY: No cough, wheezing, SOB CARDIAC: No chest pain, palpitations. No edema. GI: No abdominal pain. No N/V/D or constipation. No heartburn or reflux.  MUSCULOSKELETAL:  No back pain. No muscle ache, pain, weakness. Gait is steady. No recent falls. Mild Left hip stiffness sometimes after strength training NEUROLOGIC: No  dizziness, fainting, headache, imbalance, numbness. No change in mental status.  PSYCHIATRIC: No anxiety, depression, behavior issue. Sleeps well.    PHYSICAL EXAM:  Filed Vitals:   11/11/12 0934  BP: 172/88  Pulse: 62   Filed Weights   11/11/12 0934  Weight: 190 lb (86.183 kg)   GENERAL APPEARANCE: No acute distress, appropriately groomed, normal body habitus. Alert, pleasant, conversant. HEAD: Normocephalic, atraumatic EYES: Conjunctiva/lids clear. Pupilsdilated (intentional).   MOUTH/THROAT: Lips w/o lesions. Oral mucosa, tongue moist, w/o lesion. Oropharynx w/o redness or lesions.  RESPIRATORY: Breathing is even, unlabored. Lung sounds are clear and full.  CARDIOVASCULAR: Heart RRR. No murmur or extra heart sounds   EDEMA: No peripheral  edema.  MUSCULOSKELETAL: Moves all extremities with full ROM, strength and tone.  Gait is steady, waddling NEUROLOGIC: Oriented to time, place, person.  PSYCHIATRIC: Mood and affect appropriate to situation   ASSESSMENT/PLAN  Unspecified essential hypertension Recent high readings in clinic. Pt has been measuring BP at home range for the last month 126-142/62-78. No medication change. Check lab in May  Overweight and obesity(278.0) Pt has not been doing aerobic exercise as RX, has been doing strength training a few days/ week, has been 'pushing back' from the table a bit. Feels a bit stronger.   No weight loss. Encouraged combination of strength/ aerobic exercise to move toward goal of weight loss.   Lab: CMP. Lipid panel 12/2012   Tavis Kring T.Kaedyn Belardo, NP-C

## 2012-11-25 ENCOUNTER — Non-Acute Institutional Stay: Payer: Medicare Other | Admitting: Geriatric Medicine

## 2012-11-25 VITALS — BP 144/76 | HR 68 | Temp 99.5°F | Wt 187.0 lb

## 2012-11-25 DIAGNOSIS — K1370 Unspecified lesions of oral mucosa: Secondary | ICD-10-CM | POA: Diagnosis not present

## 2012-11-25 DIAGNOSIS — K121 Other forms of stomatitis: Secondary | ICD-10-CM

## 2012-11-25 MED ORDER — CLINDAMYCIN HCL 300 MG PO CAPS: 300.0000 mg | ORAL_CAPSULE | Freq: Three times a day (TID) | ORAL | Status: DC

## 2012-11-29 ENCOUNTER — Encounter: Payer: Self-pay | Admitting: Geriatric Medicine

## 2012-11-29 NOTE — Assessment & Plan Note (Addendum)
Small ulceration present at the left soft palate. Center is gray, there is surrounding mild erythema. Ulcer has been present approximately 5 days, no improvement with salt water rinses. May represent a secondary bacterial infection, will treat with p.o. antibiotic. If no improvement will require ENT evaluation.

## 2012-11-29 NOTE — Progress Notes (Signed)
Patient ID: Cheyenne Cruz, female   DOB: [birth year], 79 y.o.   MRN: [id number] Chief Complaint  Patient presents with  . Mouth Lesions    5 days, sores in mouth, hurts to swallow     HPI This 79 year old female resident of [facility], independent [facility] section, presents to clinic this afternoon due to persistent sores in her mouth. They have been present for about 5 days. Mild pain is constant, it hurts to swallow. She has tried salt water rinses, they have been not effective. She saw her dentist 2 days ago, was prescribed a sort of coating over the ulcers, this did not help. This patient is otherwise feeling well, does not report any specific episode that started these ulcers.  Allergies Reviewed  Medications Reviewed   Data Reviewed      Lab [facility], external 12/24/11 Glu 102, BUN 23, Cr. .98, Na 141, K+ 4.3 TC 183, TG 84, HDL 70, LDL 96 TSH 5.1 06/25/12  WBC 6.2, Hgb 13.8, Hct 40.8, Plt 266 Glu 115, BUN 23, Cr. .98, NA 140, k+ 4.2 TC 184  TG 85, HDL 76, LDL 91 TSH 3.87    Review of Systems   DATA OBTAINED: from patient GENERAL: Feels well. No fevers, fatigue, change in activity status. No change in appetite, weight SKIN: No itch, rash or open wounds EYES: No eye pain, dryness or itching. No change in vision EARS: No earache, tinnitus, change in hearing NOSE: No congestion, drainage or bleeding MOUTH/THROAT: SEE HPI. RESPIRATORY: No cough, wheezing, SOB CARDIAC: No chest pain, palpitations. No edema. GI: No abdominal pain. No N/V/D or constipation. No heartburn or reflux.  NEUROLOGIC: No dizziness, fainting, headache, imbalance, numbness. No change in mental status.  PSYCHIATRIC: No anxiety, depression, behavior issue. Sleeps well.   Physical Exam  Filed Vitals:   11/25/12 1717  BP: 144/76  Pulse: 68  Temp: 99.5 F (37.5 C)   GENERAL APPEARANCE: No acute distress, appropriately groomed, normal body habitus. Alert, pleasant, conversant. HEAD:  Normocephalic, atraumatic EYES: Conjunctiva/lids clear. Pupils round, reactive.  EARS: External exam WNL Hearing grossly normal. NOSE: No deformity or discharge. MOUTH/THROAT: Lips w/o lesions. Shallow ulceration at the left soft palate, center is gray with mild erythema surrounding. Rt.soft palate with mild erythema, no discreet ulceration NECK: Supple, full ROM. No thyroid tenderness, enlargement or nodule LYMPHATICS: No head, neck or supraclavicular adenopathy RESPIRATORY: Breathing is even, unlabored. Lung sounds are clear and full.  CARDIOVASCULAR: Heart RRR. No murmur or extra heart sounds MUSCULOSKELETAL:  Gait is waddling, steady NEUROLOGIC: Oriented to time, place, person.  PSYCHIATRIC: Mood and affect appropriate to situation  ASSESSMENT/PLAN   Oral ulcer Small ulceration present at the left soft palate. Center is gray, there is surrounding mild erythema. Ulcer has been present approximately 5 days, no improvement with salt water rinses. May represent a secondary bacterial infection, will treat with p.o. antibiotic. If no improvement will require ENT evaluation.    Follow up: AS need if no improvement Allisyn Kunz T.Aldea Avis, NP-C 11/25/2012

## 2013-01-05 ENCOUNTER — Other Ambulatory Visit: Payer: Self-pay | Admitting: Geriatric Medicine

## 2013-01-05 DIAGNOSIS — I10 Essential (primary) hypertension: Secondary | ICD-10-CM | POA: Diagnosis not present

## 2013-01-05 DIAGNOSIS — E785 Hyperlipidemia, unspecified: Secondary | ICD-10-CM | POA: Diagnosis not present

## 2013-01-06 ENCOUNTER — Non-Acute Institutional Stay: Payer: Medicare Other | Admitting: Geriatric Medicine

## 2013-01-06 ENCOUNTER — Encounter: Payer: Self-pay | Admitting: Geriatric Medicine

## 2013-01-06 DIAGNOSIS — I10 Essential (primary) hypertension: Secondary | ICD-10-CM

## 2013-01-06 DIAGNOSIS — E7800 Pure hypercholesterolemia, unspecified: Secondary | ICD-10-CM

## 2013-01-06 DIAGNOSIS — Z299 Encounter for prophylactic measures, unspecified: Secondary | ICD-10-CM | POA: Diagnosis not present

## 2013-01-06 DIAGNOSIS — R7309 Other abnormal glucose: Secondary | ICD-10-CM | POA: Diagnosis not present

## 2013-01-06 DIAGNOSIS — R739 Hyperglycemia, unspecified: Secondary | ICD-10-CM | POA: Insufficient documentation

## 2013-01-06 DIAGNOSIS — E669 Obesity, unspecified: Secondary | ICD-10-CM | POA: Diagnosis not present

## 2013-01-06 NOTE — Progress Notes (Signed)
Patient ID: Cheyenne Cruz, female   DOB: July 12, 1929, 79 y.o.   MRN: [id number] [facility] [phone])  Chief Complaint  Patient presents with  . Annual Exam  . Medical Managment of Chronic Issues    blood pressure, cholesterol, obesity     HPI: This is an 79 y.o. female resident of [facility]. This patient has not had a hospitalization, serious illness or injury in the last year. Just returned from a vacation in [place], did develop a some sneezing and scratchy throat since returning. Was able to keep up with walking, climbing stairs, getting on and off boats during her vacation. Is leaving for summer in [place] next week. Obesity remains this patient's main health issue, she has had no significant weight loss despite efforts at limiting carbohydrates fasting blood sugar remains very mildly elevated. Blood pressure remains well-controlled, lipid levels are satisfactory.  Allergies No Known Allergies  Medications  Current outpatient prescriptions:atorvastatin (LIPITOR) 20 MG tablet, TAKE ONE TABLET BY MOUTH EVERY DAY TO  TREAT  HYPERLIPIDEMIA, Disp: 90 tablet, Rfl: 0;  Cholecalciferol (VITAMIN D-3) 1000 UNITS CAPS, Take 1 capsule by mouth daily. Take 1 capsule once a day for bone/muscle health., Disp: , Rfl: ;  hydrochlorothiazide (HYDRODIURIL) 25 MG tablet, Take 25 mg by mouth daily. Take 1 tablet once a day to reduce blood pressure., Disp: , Rfl:  levothyroxine (SYNTHROID, LEVOTHROID) 25 MCG tablet, Take 25 mcg by mouth daily. For thyroid, Disp: , Rfl: ;  Multiple Vitamin (MULTIVITAMIN) tablet, Take 1 tablet by mouth daily. Take 1 tablet once a day., Disp: , Rfl: ;  Omega-3 Fatty Acids (FISH OIL) 1000 MG CAPS, Take 1 capsule by mouth daily. Take 1 capsule once a day., Disp: , Rfl:   Data Reviewed     [facility], external 12/24/11 Glu 102, BUN 23, Cr. .98, Na 141, K+ 4.3 TC 183, TG 84, HDL 70, LDL 96 TSH 5.1 06/25/12  WBC 6.2,  Hgb 13.8, Hct 40.8, Plt 266 Glu 115, BUN 23, Cr. .98, NA 140, k+ 4.2 TC 184  TG 85, HDL 76, LDL 91 TSH 3.87  01/05/2013: Glucose 106, BUN 28, sodium 140, potassium 4.5. LFTs/proteins WNL.  Total cholesterol 161, triglycerides 68, HDL 69, LDL 78 PMH  Past Medical History  Diagnosis Date  . Shortness of breath     WITH EXERTION  . Fatigue   . Hyperlipidemia   . Hypertension   . Obesity   . MVP (mitral valve prolapse)   . OA (osteoarthritis)   . Plantar fasciitis   . Osteoporosis   . Vitamin D deficiency   . H/O total hysterectomy   . Aortic valve sclerosis   . Overweight and obesity(278.0) 11/11/2012  . Thyroid disease    PSH  Past Surgical History  Procedure Laterality Date  . Skin cancer excision  2003    NOSE  . Tonsillectomy    . Foot fracture  1976  . Basal cell carcinoma excision  2006    Above left side of lip  . Abdominal hysterectomy  2001    Total Dr. Beather Arbour  . Fracture surgery  1976    Foot  . Fracture surgery Left 2012    hip ORIF   FH family history includes Aneurysm in her father; Stroke in her father; and Stroke (age of onset: 105) in her mother.  SH   Social History Narrative   Married 1954, Chrissie Noa. Retired Runner, broadcasting/film/video. Resides at [facility], Independent Living  section since 2005. Spends summers in [place].    No smoking history, drinks minimal amount of alcohol.   Review of Systems  DATA OBTAINED: from patient GENERAL: Feels well No fevers, fatigue, change in appetite or weight SKIN: No itch, rash or open wounds EYES: No eye pain, dryness or itching  No change in vision EARS: No earache, tinnitus, change in hearing NOSE: No congestion, drainage or bleeding. Sneezing MOUTH/THROAT: No mouth or tooth pain  No difficulty chewing or swallowing RESPIRATORY: No cough, mild DOE, not new CARDIAC: No chest pain, palpitations  No edema. CHEST/BREASTS: No discomfort, discharge or lumps in breasts GI: No abdominal pain  No N/V/D or  constipation  No heartburn or reflux  GU: No dysuria, frequency or urgency  No change in urine volume or character   MUSCULOSKELETAL: No joint pain. Rt.ankle swelling/stiffness (not new)  No back pain  No muscle ache, pain, weakness  Gait is unsteady, uses cane  No recent falls.  NEUROLOGIC: No dizziness, fainting, headache,No change in mental status.  PSYCHIATRIC: No feelings of anxiety, depression Sleeps well.    Physical Exam Filed Vitals:   01/06/13 1534  BP: 168/80  Pulse: 68  Temp: 98.1 F (36.7 C)  TempSrc: Oral  Height: 5\' 1"  (1.549 m)  Weight: 188 lb (85.276 kg)   Body mass index is 35.54 kg/(m^2).  GENERAL APPEARANCE: No acute distress, appropriately groomed, Obese body habitus. Alert, pleasant, conversant. HEAD: Normocephalic, atraumatic EYES: Conjunctiva/lids clear. Pupils round, reactive. EOMs intact.  EARS: External exam WNL, canals clear, TM WNL. Hearing grossly normal. NOSE: No deformity or discharge. MOUTH/THROAT: Lips w/o lesions. Oral mucosa, tongue moist, w/o lesion. Oropharynx w/o redness or lesions.  NECK: Supple, full ROM. No thyroid tenderness, enlargement or nodule LYMPHATICS: No head, neck or supraclavicular adenopathy RESPIRATORY: Breathing is even, unlabored. Lung sounds are clear and full.  CARDIOVASCULAR: Heart RRR. No murmur or extra heart sounds  ARTERIAL: No carotid bruit. Carotid, Popliteal, DP pulse 2+.  VENOUS: No varicosities. No venous stasis skin changes  EDEMA: No peripheral or periorbital edema. No ascites GASTROINTESTINAL: Abdomen is soft, non-tender, not distended w/ normal bowel sounds. No hepatic or splenic enlargement. No mass, ventral or inguinal hernia. GENITOURINARY: Bladder non tender, not distende G MUSCULOSKELETAL: Upper extremities with full range of motion, good strength and tone. Stiff movement of hip and knees bilaterally. Left ankle with chronic swelling and foot deformity. Both feet are flat. Gait is unsteady and waddling  Back is without kyphosis, scoliosis or spinal process tenderness.  NEUROLOGIC:  Oriented to time, place, person. Cranial nerves 2-12 grossly intact, speech clear, no tremor. Patella, brachial DTR 1+ PSYCHIATRIC: Mood and affect appropriate to situation  ASSESSMENT/PLAN  Overweight and obesity(278.0) No change in weight, patient has been a little more active in the last few months. Repeated conversations regarding diet modification and increasing activity. She is hopeful that the summertime in [place] will be a good time for her to work on her goal of weight reduction  Unspecified essential hypertension Blood pressure remained satisfactory, lab satisfactory as well. Continue current medication  HYPERCHOLESTEROLEMIA Lipid levels continued to be satisfactory, continue current medications    Follow up: When patient returns from summer in [place], NP-C 01/06/2013

## 2013-01-19 NOTE — Assessment & Plan Note (Signed)
No change in weight, patient has been a little more active in the last few months. Repeated conversations regarding diet modification and increasing activity. She is hopeful that the summertime in [place] will be a good time for her to work on her goal of weight reduction

## 2013-01-19 NOTE — Assessment & Plan Note (Signed)
Lipid levels continued to be satisfactory, continue current medications

## 2013-01-19 NOTE — Assessment & Plan Note (Signed)
Blood pressure remained satisfactory, lab satisfactory as well. Continue current medication

## 2013-02-03 ENCOUNTER — Encounter: Payer: Self-pay | Admitting: Internal Medicine

## 2013-06-08 DIAGNOSIS — Z23 Encounter for immunization: Secondary | ICD-10-CM | POA: Diagnosis not present

## 2013-07-01 DIAGNOSIS — I10 Essential (primary) hypertension: Secondary | ICD-10-CM | POA: Diagnosis not present

## 2013-07-01 DIAGNOSIS — E039 Hypothyroidism, unspecified: Secondary | ICD-10-CM | POA: Diagnosis not present

## 2013-07-01 DIAGNOSIS — E785 Hyperlipidemia, unspecified: Secondary | ICD-10-CM | POA: Diagnosis not present

## 2013-07-01 DIAGNOSIS — Z79899 Other long term (current) drug therapy: Secondary | ICD-10-CM | POA: Diagnosis not present

## 2013-07-01 LAB — LIPID PANEL
HDL: 75 mg/dL — AB (ref 35–70)
LDL Cholesterol: 95 mg/dL

## 2013-07-01 LAB — BASIC METABOLIC PANEL
BUN: 24 mg/dL — AB (ref 4–21)
Creatinine: 1 mg/dL (ref 0.5–1.1)
Sodium: 142 mmol/L (ref 137–147)

## 2013-07-01 LAB — CBC AND DIFFERENTIAL
HCT: 41 % (ref 36–46)
Hemoglobin: 13.9 g/dL (ref 12.0–16.0)
WBC: 5.7 10^3/mL

## 2013-07-01 LAB — TSH: TSH: 1.93 u[IU]/mL (ref 0.41–5.90)

## 2013-07-07 ENCOUNTER — Encounter: Payer: Self-pay | Admitting: Geriatric Medicine

## 2013-07-07 ENCOUNTER — Non-Acute Institutional Stay: Payer: Medicare Other | Admitting: Geriatric Medicine

## 2013-07-07 VITALS — BP 120/72 | HR 52 | Ht 61.0 in | Wt 187.0 lb

## 2013-07-07 DIAGNOSIS — R7309 Other abnormal glucose: Secondary | ICD-10-CM | POA: Diagnosis not present

## 2013-07-07 DIAGNOSIS — I10 Essential (primary) hypertension: Secondary | ICD-10-CM

## 2013-07-07 DIAGNOSIS — E039 Hypothyroidism, unspecified: Secondary | ICD-10-CM | POA: Diagnosis not present

## 2013-07-07 DIAGNOSIS — E669 Obesity, unspecified: Secondary | ICD-10-CM

## 2013-07-07 DIAGNOSIS — R739 Hyperglycemia, unspecified: Secondary | ICD-10-CM

## 2013-07-07 NOTE — Assessment & Plan Note (Signed)
Most recent fasting blood sugar satisfactory for age.

## 2013-07-07 NOTE — Assessment & Plan Note (Signed)
Patient is asymptomatic, recent TSH satisfactory, continue current supplement

## 2013-07-07 NOTE — Progress Notes (Signed)
Patient ID: Cheyenne Cruz, female   DOB: 11/02/1928, 79 y.o.   MRN: [id number] [facility] [phone])  Chief Complaint  Patient presents with  . Medical Managment of Chronic Issues    blood pressure, obesity    HPI: This is a 79 y.o. female resident of [facility]  section evaluated today for management of ongoing medical issues.     Last visit:  Overweight and obesity(278.0) No change in weight, patient has been a little more active in the last few months. Repeated conversations regarding diet modification and increasing activity. She is hopeful that the summertime in [place] will be a good time for her to work on her goal of weight reduction  Unspecified essential hypertension Blood pressure remained satisfactory, lab satisfactory as well. Continue current medication  HYPERCHOLESTEROLEMIA Lipid levels continued to be satisfactory, continue current medications  Since last visit, patient has spent the summer in [place], no acute medical issues. She has continued to attempt weight reduction by diet modification and exercise. Did a fair amount of gardening over the summer. She is currently participating in 3 times a week exercise class which include aerobic activity and weight training. She does admit that diet modifications have not been very successful, and she's not doing much other activity beyond the exercise classes. She is has increased her water intake which sometimes helps suppress her appetite. Recent lab studies were all satisfactory. Lipid panel does show very mild increase in LDL , Still within normal limits.  Allergies No Known Allergies    Medication List       This list is accurate as of: 07/07/13 11:30 AM.  Always use your most recent med list.               atorvastatin 20 MG tablet  Commonly known as:  LIPITOR  TAKE ONE TABLET BY MOUTH EVERY DAY TO  TREAT  HYPERLIPIDEMIA     calcium carbonate 600  MG Tabs tablet  Commonly known as:  OS-CAL  Take 600 mg by mouth. Take one tablet twice daily     Fish Oil 1000 MG Caps  Take 1 capsule by mouth daily. Take 1 capsule once a day.     hydrochlorothiazide 25 MG tablet  Commonly known as:  HYDRODIURIL  Take 25 mg by mouth daily. Take 1 tablet once a day to reduce blood pressure.     levothyroxine 25 MCG tablet  Commonly known as:  SYNTHROID, LEVOTHROID  Take 25 mcg by mouth daily. For thyroid     multivitamin tablet  Take 1 tablet by mouth daily. Take 1 tablet once a day.     Vitamin D-3 1000 UNITS Caps  Take 1 capsule by mouth daily. Take 1 capsule once a day for bone/muscle health.        Data Reviewed  Laboratory Studies  [facility] 06/25/12  WBC 6.2, Hgb 13.8, Hct 40.8, Plt 266 Glu 115, BUN 23, Cr. .98, NA 140, k+ 4.2 TC 184  TG 85, HDL 76, LDL 91 TSH 3.87  01/05/2013: Glucose 106, BUN 28, sodium 140, potassium 4.5. LFTs/proteins WNL.  Total cholesterol 161, triglycerides 68, HDL 69, LDL 78  Lab Results- [facility]  Component Value Date   WBC 5.7 07/01/2013   HGB 13.9 07/01/2013   HCT 41 07/01/2013   PLT 232 07/01/2013        CHOL 187 07/01/2013   TRIG 50 07/01/2013   HDL 75* 07/01/2013   LDLCALC 95  07/01/2013        GLUCOSE 104 07/01/2013   NA 142 07/01/2013   K 4.7 07/01/2013   CREATININE 1.0 07/01/2013   BUN 24* 07/01/2013   TSH 1.93 07/01/2013    Review of Systems  DATA OBTAINED: from patient GENERAL: Feels well No fevers, fatigue, change in appetite or weight SKIN: No itch, rash or open wounds NOSE: No congestion, drainage or bleeding. Sneezing MOUTH/THROAT: No mouth or tooth pain  No difficulty chewing or swallowing RESPIRATORY: No cough, mild DOE, not new CARDIAC: No chest pain, palpitations  No edema. GI: No abdominal pain  No N/V/D or constipation  No heartburn or reflux  GU: No dysuria. Has urine leakage, wears a pad during the day. Nocturia x2  No change in urine volume or character     MUSCULOSKELETAL: No joint pain. Rt.ankle swelling/stiffness (not new)  No back pain  No muscle ache, pain, weakness  Gait is more steady  No recent falls.  NEUROLOGIC: No dizziness, fainting, headache,No change in mental status.  PSYCHIATRIC: No feelings of anxiety, depression Sleeps well.    Physical Exam Filed Vitals:   07/07/13 1124  BP: 120/72  Pulse: 52  Height: 5\' 1"  (1.549 m)  Weight: 187 lb (84.823 kg)   Body mass index is 35.35 kg/(m^2).  GENERAL APPEARANCE: No acute distress, appropriately groomed, Obese body habitus. Alert, pleasant, conversant. HEAD: Normocephalic, atraumatic EYES: Conjunctiva/lids clear. Pupils round, reactive. Marland Kitchen  EARS: Hearing grossly normal. NOSE: No deformity or discharge. MOUTH/THROAT: Lips w/o lesions. Oral mucosa, tongue moist, w/o lesion. Oropharynx w/o redness or lesions.  NECK: Supple, full ROM. No thyroid tenderness, enlargement or nodule LYMPHATICS: No head, neck or supraclavicular adenopathy RESPIRATORY: Breathing is even, unlabored. Lung sounds are clear and full.  CARDIOVASCULAR: Heart RRR. No murmur or extra heart sounds  EDEMA: No peripheral edema.  MUSCULOSKELETAL: Gait is unsteady and waddling  NEUROLOGIC:  Oriented to time, place, person. Speech clear, no tremor. PSYCHIATRIC: Mood and affect appropriate to situation  ASSESSMENT/PLAN  Unspecified essential hypertension Blood pressure remains well controlled with current medication, recent lab satisfactory.  HYPOTHYROIDISM Patient is asymptomatic, recent TSH satisfactory, continue current supplement  Overweight and obesity(278.0) No change in weight, patient is exercising 3 times a week regularly. Discussed strategies to increase activity to 5 times a week.  Hyperglycemia Most recent fasting blood sugar satisfactory for age.    Follow up: 6mos, EV  Trinitey Roache T.Colbert Curenton, NP-C 07/07/2013

## 2013-07-07 NOTE — Assessment & Plan Note (Signed)
No change in weight, patient is exercising 3 times a week regularly. Discussed strategies to increase activity to 5 times a week.

## 2013-07-07 NOTE — Assessment & Plan Note (Addendum)
Blood pressure remains well controlled with current medication, recent lab satisfactory.

## 2013-07-21 ENCOUNTER — Encounter: Payer: Self-pay | Admitting: Geriatric Medicine

## 2013-08-17 ENCOUNTER — Other Ambulatory Visit: Payer: Self-pay | Admitting: Geriatric Medicine

## 2013-08-18 DIAGNOSIS — L570 Actinic keratosis: Secondary | ICD-10-CM | POA: Diagnosis not present

## 2013-08-18 DIAGNOSIS — D1801 Hemangioma of skin and subcutaneous tissue: Secondary | ICD-10-CM | POA: Diagnosis not present

## 2013-08-18 DIAGNOSIS — L821 Other seborrheic keratosis: Secondary | ICD-10-CM | POA: Diagnosis not present

## 2013-08-18 DIAGNOSIS — Z85828 Personal history of other malignant neoplasm of skin: Secondary | ICD-10-CM | POA: Diagnosis not present

## 2013-08-18 DIAGNOSIS — L909 Atrophic disorder of skin, unspecified: Secondary | ICD-10-CM | POA: Diagnosis not present

## 2013-08-18 DIAGNOSIS — D2370 Other benign neoplasm of skin of unspecified lower limb, including hip: Secondary | ICD-10-CM | POA: Diagnosis not present

## 2013-10-09 ENCOUNTER — Other Ambulatory Visit: Payer: Self-pay | Admitting: Internal Medicine

## 2013-10-27 DIAGNOSIS — H2510 Age-related nuclear cataract, unspecified eye: Secondary | ICD-10-CM | POA: Diagnosis not present

## 2013-10-27 DIAGNOSIS — H52209 Unspecified astigmatism, unspecified eye: Secondary | ICD-10-CM | POA: Diagnosis not present

## 2013-12-09 DIAGNOSIS — I10 Essential (primary) hypertension: Secondary | ICD-10-CM | POA: Diagnosis not present

## 2013-12-09 DIAGNOSIS — E079 Disorder of thyroid, unspecified: Secondary | ICD-10-CM | POA: Diagnosis not present

## 2013-12-09 DIAGNOSIS — R5383 Other fatigue: Secondary | ICD-10-CM | POA: Diagnosis not present

## 2013-12-09 DIAGNOSIS — R5381 Other malaise: Secondary | ICD-10-CM | POA: Diagnosis not present

## 2013-12-09 DIAGNOSIS — E785 Hyperlipidemia, unspecified: Secondary | ICD-10-CM | POA: Diagnosis not present

## 2013-12-09 LAB — LIPID PANEL
Cholesterol: 182 mg/dL (ref 0–200)
HDL: 84 mg/dL — AB (ref 35–70)
LDL CALC: 92 mg/dL
LDl/HDL Ratio: 2.2
Triglycerides: 68 mg/dL (ref 40–160)

## 2013-12-09 LAB — BASIC METABOLIC PANEL
BUN: 22 mg/dL — AB (ref 4–21)
Creatinine: 0.9 mg/dL (ref 0.5–1.1)
GLUCOSE: 98 mg/dL
Potassium: 4.4 mmol/L (ref 3.4–5.3)
Sodium: 142 mmol/L (ref 137–147)

## 2013-12-09 LAB — HEPATIC FUNCTION PANEL
ALT: 19 U/L (ref 7–35)
AST: 23 U/L (ref 13–35)
Alkaline Phosphatase: 46 U/L (ref 25–125)
BILIRUBIN, TOTAL: 0.6 mg/dL

## 2013-12-09 LAB — CBC AND DIFFERENTIAL
HCT: 41 % (ref 36–46)
Hemoglobin: 14 g/dL (ref 12.0–16.0)
Platelets: 217 10*3/uL (ref 150–399)
WBC: 7.3 10^3/mL

## 2013-12-09 LAB — TSH: TSH: 4.58 u[IU]/mL (ref 0.41–5.90)

## 2013-12-22 ENCOUNTER — Non-Acute Institutional Stay: Payer: Medicare Other | Admitting: Geriatric Medicine

## 2013-12-22 ENCOUNTER — Other Ambulatory Visit: Payer: Self-pay | Admitting: Geriatric Medicine

## 2013-12-22 ENCOUNTER — Encounter: Payer: Self-pay | Admitting: Geriatric Medicine

## 2013-12-22 VITALS — BP 132/76 | HR 60 | Temp 97.4°F | Ht 61.75 in | Wt 190.0 lb

## 2013-12-22 DIAGNOSIS — E669 Obesity, unspecified: Secondary | ICD-10-CM | POA: Diagnosis not present

## 2013-12-22 DIAGNOSIS — R7309 Other abnormal glucose: Secondary | ICD-10-CM

## 2013-12-22 DIAGNOSIS — Z79899 Other long term (current) drug therapy: Secondary | ICD-10-CM | POA: Diagnosis not present

## 2013-12-22 DIAGNOSIS — IMO0001 Reserved for inherently not codable concepts without codable children: Secondary | ICD-10-CM | POA: Diagnosis not present

## 2013-12-22 DIAGNOSIS — I10 Essential (primary) hypertension: Secondary | ICD-10-CM

## 2013-12-22 DIAGNOSIS — E7800 Pure hypercholesterolemia, unspecified: Secondary | ICD-10-CM

## 2013-12-22 DIAGNOSIS — Z0000 Encounter for general adult medical examination without abnormal findings: Secondary | ICD-10-CM

## 2013-12-22 DIAGNOSIS — R0989 Other specified symptoms and signs involving the circulatory and respiratory systems: Secondary | ICD-10-CM

## 2013-12-22 DIAGNOSIS — M810 Age-related osteoporosis without current pathological fracture: Secondary | ICD-10-CM | POA: Diagnosis not present

## 2013-12-22 DIAGNOSIS — R0609 Other forms of dyspnea: Secondary | ICD-10-CM

## 2013-12-22 DIAGNOSIS — E039 Hypothyroidism, unspecified: Secondary | ICD-10-CM

## 2013-12-22 DIAGNOSIS — R739 Hyperglycemia, unspecified: Secondary | ICD-10-CM

## 2013-12-22 NOTE — Progress Notes (Signed)
Patient ID: Cheyenne Cruz, female   DOB: 15-Oct-1928, 80 y.o.   MRN: [id number]   [facility] [phone])  Code Status: DNR. Living Will  Contact Information   Name Relation Home Work [place]         Chief Complaint  Patient presents with  . Medical Management of Chronic Issues    Comprehensive exam: blood pressure, cholesterol, thyroid    HPI: This is a 80 y.o. female resident of [facility]. This patient  has not had a hospitalization, serious illness or injury in the last year.  Last visit: Unspecified essential hypertension Blood pressure remains well controlled with current medication, recent lab satisfactory. HYPOTHYROIDISM Patient is asymptomatic, recent TSH satisfactory, continue current supplement Overweight and obesity(278.0) No change in weight, patient is exercising 3 times a week regularly. Discussed strategies to increase activity to 5 times a week. Hyperglycemia Most recent fasting blood sugar satisfactory for age.  Since last visit no acute medical issues. Lab results from April 23 all satisfactory. Patient's most recent colonoscopy was in 2010, most recent DEXA scan 2011, last mammogram 2013. Immunizations are up to date.   No Known Allergies  MEDICATIONS -     Medication List       This list is accurate as of: 12/22/13 11:02 AM.  Always use your most recent med list.               atorvastatin 20 MG tablet  Commonly known as:  LIPITOR  TAKE ONE TABLET BY MOUTH ONCE DAILY TO TREAT HYPERLIPIDEMIA.     calcium carbonate 600 MG Tabs tablet  Commonly known as:  OS-CAL  Take 600 mg by mouth. Take one tablet twice daily     Fish Oil 1000 MG Caps  Take 1 capsule by mouth daily. Take 1 capsule once a day.     hydrochlorothiazide 25 MG tablet  Commonly known as:  HYDRODIURIL  TAKE ONE TABLET BY MOUTH EVERY DAY TO  REDUCE  BLOOD  PRESSURE     levothyroxine 25 MCG tablet  Commonly known as:  SYNTHROID, LEVOTHROID  TAKE ONE TABLET BY MOUTH ONCE DAILY     multivitamin tablet  Take 1 tablet by mouth daily. Take 1 tablet once a day.     Vitamin D-3 1000 UNITS Caps  Take 1 capsule by mouth daily. Take 1 capsule once a day for bone/muscle health.         DATA REVIEWED  Radiologic Exams:   Cardiovascular Exams:   Laboratory Studies: Lab Results  Component Value Date   WBC 7.3 12/09/2013   HGB 14.0 12/09/2013   HCT 41 12/09/2013   PLT 217 12/09/2013   Lab Results  Component Value Date   NA 142 12/09/2013   K 4.4 12/09/2013   GLU 98 12/09/2013   BUN 22* 12/09/2013   CREATININE 0.9 12/09/2013   Lab Results  Component Value Date   NA 142 12/09/2013   K 4.4 12/09/2013   BUN 22* 12/09/2013   CREATININE 0.9 12/09/2013   AST 23 12/09/2013   ALT 19 12/09/2013   ALKPHOS 46 12/09/2013   Albumin 3.9 Lab Results  Component Value Date   CHOL 182 12/09/2013   HDL 84* 12/09/2013   LDLCALC 92 12/09/2013   TRIG 68 12/09/2013    Lab Results  Component Value Date   TSH 4.58 12/09/2013        Past Medical History  Diagnosis Date  . Shortness of breath     WITH EXERTION  . Fatigue   . Hyperlipidemia   . Hypertension   . Obesity   . MVP (mitral valve prolapse)   . OA (osteoarthritis)   . Plantar fasciitis   . Osteoporosis, senile 08/15/2004  . Vitamin D deficiency   . H/O total hysterectomy   . Aortic valve sclerosis   . Overweight and obesity(278.0) 11/11/2012  . Thyroid disease   . Closed fracture of shaft of femur 11.14/2012  . Nontoxic uninodular goiter 08/06/2010  . Unspecified hypothyroidism 08/06/2010  . Myalgia and myositis, unspecified 08/06/2010  . Nonspecific abnormal results of pulmonary system function study 08/06/2010  . Other abnormal blood chemistry 06/25/2010  . Enthesopathy of unspecified site 03/19/2009  . Pain in joint, ankle and foot 12/28/2008  . Abnormality of gait 10/09/2008  . Unspecified asthma, with  exacerbation 12/02/2005  . Unspecified essential hypertension 08/15/2004   Past Surgical History  Procedure Laterality Date  . Skin cancer excision  2003    NOSE  . Tonsillectomy    . Foot fracture  1976  . Basal cell carcinoma excision  2006    Above left side of lip  . Abdominal hysterectomy  2001    Total Dr. Rexford Maus  . Fracture surgery  1976    Foot  . Fracture surgery Left 2012    hip ORIF  . Colonoscopy  09/27/2008    Dr. Sammuel Cooper normal   Family Status  Relation Status Death Age  . Mother Deceased 42    Cause of Death: CVA  . Father Deceased 75    Cause of Death: CVA  . Daughter Alive   . Son Alive   . Daughter Alive    History   Social History Narrative   Married 1954, [name]. Retired Pharmacist, hospital. 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 Spends summers in [place].    No smoking history    drinks minimal amount of alcohol.   Exercise predominantly weight training, walking dog, treadmill    REVIEW OF SYSTEMS  DATA OBTAINED: from patient  GENERAL: Feels well No fevers, fatigue, change in appetite or weight  SKIN: No itch, rash or open wounds  NOSE: No congestion, drainage or bleeding.   MOUTH/THROAT: No mouth or tooth pain No difficulty chewing or swallowing  RESPIRATORY: No cough, mild DOE, not new  CARDIAC: No chest pain, palpitations No edema.  GI: No abdominal pain No N/V/D or constipation No heartburn or reflux  GU: No dysuria. Has urine leakage, wears a pad during the day. Nocturia x2 No change in urine volume or character  MUSCULOSKELETAL: No joint pain. Rt.ankle swelling/stiffness (not new) No back pain No muscle ache, pain, weakness Gait is more steady. Participating in exercise class 3x/ week ([employer]) also is walking dog daily. No recent falls.  NEUROLOGIC: No dizziness, fainting, headache,No change in mental status.  PSYCHIATRIC:  Sleeps well, usually. Occasional night "can't turn off my brain". Recent increased  tension re: spouse's fall/ general concern about his health (neuro evaluation pending)   PHYSICAL EXAM Filed Vitals:   12/22/13 1012  BP: 132/76  Pulse: 60  Temp: 97.4 F (36.3 C)  TempSrc: Oral  Height: 5' 1.75" (1.568 m)  Weight: 190 lb (86.183 kg)   Body mass index is 35.05 kg/(m^2).  GENERAL APPEARANCE: No acute distress, appropriately groomed, Obese body habitus. Alert, pleasant, conversant.  HEAD: Normocephalic, atraumatic  EYES: Conjunctiva/lids clear. Pupils round, reactive. Marland Kitchen  EARS: Hearing grossly normal. Cerumen present left ear canal NOSE: No deformity or discharge.  MOUTH/THROAT: Lips w/o lesions. Oral mucosa, tongue moist, w/o lesion. Oropharynx w/o redness or lesions.  NECK: Supple, full ROM. No thyroid tenderness, enlargement or nodule  LYMPHATICS: No head, neck or supraclavicular adenopathy  RESPIRATORY: Breathing is even, unlabored. Lung sounds are clear and full.  CARDIOVASCULAR: Heart RRR. No murmur or extra heart sounds   ARTERIAL: No carotid or femoral bruit. Carotid, RT. Femoral, Popliteal, Rt PT pulse 2+. Left femoral 1+, Absent left PT.  VENOUS: Mild superficial varicosities Rt. Ankle/foot . No venous stasis skin changes  EDEMA: No peripheral edema.  MUSCULOSKELETAL: Moves all extremities with full range of motion adequate strength and tone. Deep flat bilaterally, right foot with pronation, mild deformity. Gait is steady, mild  waddling  NEUROLOGIC: Oriented to time, place, person. Cranial nerves grossly intact. Speech clear, no tremor.  PSYCHIATRIC: Mood and affect appropriate to situation  ASSESSMENT/PLAN  Unspecified essential hypertension Blood pressure remains satisfactory, recent satisfactory as well, continue current medication.  HYPOTHYROIDISM Patient without symptoms of hypothyroidism, recent TSH satisfactory. Continue current supplement dose  SENILE OSTEOPOROSIS No bone pain or fractures. Last DEXA scan November 2011 with improved bone  density noted. Patient with previous bisphosphonate use, currently takes calcium and vitamin D daily. Recommend repeat DEXA scan  HYPERCHOLESTEROLEMIA Lipid level satisfactory no adverse effects from statin medication, continue medication.   DYSPNEA ON EXERTION Unchanged, encouraged patient to continue to be active as possible  Overweight and obesity(278.0) Weight is unchanged the patient is more active than she was a year ago. Discussion today regarding the importance of maintaining her activity status during her summer time in [place]. She anticipates gardening also told her opportunity for exercise class as well. We'll also continue walking the dog. Reminded patient of importance of maintaining strength, balance, flexibility as she ages.  Hyperglycemia Fasting blood sugar remains satisfactory  Healthcare maintenance Patient's immunizations are up to date. Last mammogram was 2013 recommend repeat this time. Last DEXA scan 2011 recommend repeat. Patient agreed to undergo pharmacogenetic testing today. Once results are received, will contact patient if medication adjustments are necessary    Family/ staff Communication:    Goals of care:   Maintain maximal functional status and quality of life   Labs/tests ordered:    Follow up: Return in about 6 months (around 06/24/2014) for OV, BP, weight.  Mardene Celeste, NP-C [place] [phone]  12/22/2013

## 2013-12-22 NOTE — Assessment & Plan Note (Signed)
Patient's immunizations are up to date. Last mammogram was 2013 recommend repeat this time. Last DEXA scan 2011 recommend repeat. Patient agreed to undergo pharmacogenetic testing today. Once results are received, will contact patient if medication adjustments are necessary

## 2013-12-22 NOTE — Assessment & Plan Note (Signed)
Fasting blood sugar remains satisfactory

## 2013-12-22 NOTE — Assessment & Plan Note (Signed)
Unchanged, encouraged patient to continue to be active as possible

## 2013-12-22 NOTE — Assessment & Plan Note (Signed)
Lipid level satisfactory no adverse effects from statin medication, continue medication.

## 2013-12-22 NOTE — Assessment & Plan Note (Signed)
No bone pain or fractures. Last DEXA scan November 2011 with improved bone density noted. Patient with previous bisphosphonate use, currently takes calcium and vitamin D daily. Recommend repeat DEXA scan

## 2013-12-22 NOTE — Assessment & Plan Note (Signed)
Blood pressure remains satisfactory, recent satisfactory as well, continue current medication.

## 2013-12-22 NOTE — Assessment & Plan Note (Signed)
Weight is unchanged the patient is more active than she was a year ago. Discussion today regarding the importance of maintaining her activity status during her summer time in [place]. She anticipates gardening also told her opportunity for exercise class as well. We'll also continue walking the dog. Reminded patient of importance of maintaining strength, balance, flexibility as she ages.

## 2013-12-22 NOTE — Assessment & Plan Note (Signed)
Patient without symptoms of hypothyroidism, recent TSH satisfactory. Continue current supplement dose

## 2013-12-24 ENCOUNTER — Other Ambulatory Visit: Payer: Self-pay | Admitting: Geriatric Medicine

## 2013-12-24 DIAGNOSIS — Z1231 Encounter for screening mammogram for malignant neoplasm of breast: Secondary | ICD-10-CM | POA: Diagnosis not present

## 2013-12-24 DIAGNOSIS — Z803 Family history of malignant neoplasm of breast: Secondary | ICD-10-CM | POA: Diagnosis not present

## 2014-01-14 ENCOUNTER — Encounter: Payer: Self-pay | Admitting: Internal Medicine

## 2014-02-03 DIAGNOSIS — M549 Dorsalgia, unspecified: Secondary | ICD-10-CM | POA: Diagnosis not present

## 2014-02-23 DIAGNOSIS — S20219A Contusion of unspecified front wall of thorax, initial encounter: Secondary | ICD-10-CM | POA: Diagnosis not present

## 2014-02-23 DIAGNOSIS — I10 Essential (primary) hypertension: Secondary | ICD-10-CM | POA: Diagnosis not present

## 2014-06-17 ENCOUNTER — Telehealth: Payer: Self-pay | Admitting: *Deleted

## 2014-06-17 NOTE — Telephone Encounter (Signed)
Cheyenne Cruz with [facility] called and wanted to know if patient should have Bloodwork prior to OV. Checked chart and last OV note and nothing stated to have labs.

## 2014-06-29 ENCOUNTER — Non-Acute Institutional Stay: Payer: Medicare Other | Admitting: Nurse Practitioner

## 2014-06-29 ENCOUNTER — Telehealth: Payer: Self-pay

## 2014-06-29 ENCOUNTER — Ambulatory Visit: Payer: Medicare Other

## 2014-06-29 ENCOUNTER — Encounter: Payer: Self-pay | Admitting: Nurse Practitioner

## 2014-06-29 VITALS — BP 132/84 | HR 80 | Temp 97.7°F | Wt 191.0 lb

## 2014-06-29 DIAGNOSIS — R000 Tachycardia, unspecified: Secondary | ICD-10-CM

## 2014-06-29 DIAGNOSIS — E7800 Pure hypercholesterolemia, unspecified: Secondary | ICD-10-CM

## 2014-06-29 DIAGNOSIS — I10 Essential (primary) hypertension: Secondary | ICD-10-CM | POA: Diagnosis not present

## 2014-06-29 DIAGNOSIS — E039 Hypothyroidism, unspecified: Secondary | ICD-10-CM | POA: Diagnosis not present

## 2014-06-29 DIAGNOSIS — Z23 Encounter for immunization: Secondary | ICD-10-CM

## 2014-06-29 DIAGNOSIS — M810 Age-related osteoporosis without current pathological fracture: Secondary | ICD-10-CM

## 2014-06-29 DIAGNOSIS — I4891 Unspecified atrial fibrillation: Secondary | ICD-10-CM

## 2014-06-29 MED ORDER — HYDROCHLOROTHIAZIDE 25 MG PO TABS: ORAL_TABLET | ORAL | Status: DC

## 2014-06-29 MED ORDER — RIVAROXABAN 20 MG PO TABS: 20.0000 mg | ORAL_TABLET | Freq: Every day | ORAL | Status: DC

## 2014-06-29 MED ORDER — ATORVASTATIN CALCIUM 20 MG PO TABS: ORAL_TABLET | ORAL | Status: DC

## 2014-06-29 MED ORDER — METOPROLOL TARTRATE 25 MG PO TABS: 25.0000 mg | ORAL_TABLET | Freq: Two times a day (BID) | ORAL | Status: DC

## 2014-06-29 MED ORDER — LEVOTHYROXINE SODIUM 25 MCG PO TABS: ORAL_TABLET | ORAL | Status: DC

## 2014-06-29 NOTE — Progress Notes (Signed)
Patient ID: Cheyenne Cruz, female   DOB: February 11, 1929, 80 y.o.   MRN: [id number]    Nursing Home Location:  [facility]   Place of Service: Clinic (12)  PCP: Estill Dooms, MD  No Known Allergies  Chief Complaint  Patient presents with  . Medical Management of Chronic Issues    blood pressure, thyroid, cholesterol. Just got back from [place]    HPI:  Patient is a 80 y.o. female seen today at [facility] [place] [facility]. Just got back from summer home in [place]. Planning to get a dexa scan later this month. No complaints Eating well. Good appetite.  Needing refills on prescription.  Having pain in knee- taking aleve daily  Exercising 45 minutes 3 times a week at [employer] program  Review of Systems:  Review of Systems  Constitutional: Negative for activity change, appetite change, fatigue and unexpected weight change.  HENT: Negative for congestion and hearing loss.   Eyes: Negative.   Respiratory: Positive for shortness of breath (on exertion ). Negative for cough.   Cardiovascular: Negative for chest pain, palpitations (rare) and leg swelling.  Gastrointestinal: Negative for abdominal pain, diarrhea and constipation.  Genitourinary: Negative for dysuria and difficulty urinating.       Nocturia, occ leakage, wears pad  Musculoskeletal: Negative for myalgias and arthralgias.  Skin: Negative for color change and wound.  Neurological: Negative for dizziness and weakness.  Psychiatric/Behavioral: Negative for behavioral problems, confusion and agitation.    Past Medical History  Diagnosis Date  . Shortness of breath     WITH EXERTION  . Fatigue   . Hyperlipidemia   . Hypertension   . Obesity   . MVP (mitral valve prolapse)   . OA (osteoarthritis)   . Plantar fasciitis   . Osteoporosis, senile 08/15/2004  . Vitamin D deficiency   . H/O total hysterectomy   . Aortic valve sclerosis   . Overweight and obesity(278.0) 11/11/2012  .  Thyroid disease   . Closed fracture of shaft of femur 11.14/2012  . Nontoxic uninodular goiter 08/06/2010  . Unspecified hypothyroidism 08/06/2010  . Myalgia and myositis, unspecified 08/06/2010  . Nonspecific abnormal results of pulmonary system function study 08/06/2010  . Other abnormal blood chemistry 06/25/2010  . Enthesopathy of unspecified site 03/19/2009  . Pain in joint, ankle and foot 12/28/2008  . Abnormality of gait 10/09/2008  . Unspecified asthma, with exacerbation 12/02/2005  . Unspecified essential hypertension 08/15/2004   Past Surgical History  Procedure Laterality Date  . Skin cancer excision  2003    NOSE  . Tonsillectomy    . Foot fracture  1976  . Basal cell carcinoma excision  2006    Above left side of lip  . Abdominal hysterectomy  2001    Total Dr. Rexford Maus  . Fracture surgery  1976    Foot  . Fracture surgery Left 2012    hip ORIF  . Colonoscopy  09/27/2008    Dr. Sammuel Cooper normal   Social History:   reports that she has never smoked. She has never used smokeless tobacco. She reports that she drinks alcohol. She reports that she does not use illicit drugs.  Family History  Problem Relation Age of Onset  . Stroke Mother 14    FOLLOWING A BROKEN HIP  . Stroke Father   . Aneurysm Father     Abdominal aortic, repaired    Medications: Patient's Medications  New Prescriptions   No medications on file  Previous  Medications   ATORVASTATIN (LIPITOR) 20 MG TABLET    TAKE ONE TABLET BY MOUTH ONCE DAILY TO TREAT HYPERLIPIDEMIA.   CALCIUM CARBONATE (OS-CAL) 600 MG TABS TABLET    Take 600 mg by mouth. Take one tablet twice daily   CHOLECALCIFEROL (VITAMIN D-3) 1000 UNITS CAPS    Take 1 capsule by mouth daily. Take 1 capsule once a day for bone/muscle health.   HYDROCHLOROTHIAZIDE (HYDRODIURIL) 25 MG TABLET    TAKE ONE TABLET BY MOUTH EVERY DAY TO  REDUCE  BLOOD  PRESSURE   LEVOTHYROXINE (SYNTHROID, LEVOTHROID) 25 MCG TABLET    TAKE ONE TABLET BY MOUTH ONCE  DAILY   MULTIPLE VITAMIN (MULTIVITAMIN) TABLET    Take 1 tablet by mouth daily. Take 1 tablet once a day.   OMEGA-3 FATTY ACIDS (FISH OIL) 1000 MG CAPS    Take 1 capsule by mouth daily. Take 1 capsule once a day.  Modified Medications   No medications on file  Discontinued Medications   No medications on file     Physical Exam: Filed Vitals:   06/29/14 1042  BP: 132/84  Pulse: 80  Temp: 97.7 F (36.5 C)  TempSrc: Oral  Weight: 191 lb (86.637 kg)    Physical Exam  Constitutional: She is oriented to person, place, and time. She appears well-developed and well-nourished. No distress.  HENT:  Head: Normocephalic and atraumatic.  Mouth/Throat: Oropharynx is clear and moist. No oropharyngeal exudate.  Eyes: Conjunctivae are normal. Pupils are equal, round, and reactive to light.  Neck: Normal range of motion. Neck supple.  Cardiovascular: An irregular rhythm present. Tachycardia present.   Pulmonary/Chest: Effort normal and breath sounds normal.  Abdominal: Soft. Bowel sounds are normal.  Musculoskeletal: Normal range of motion. She exhibits edema (to right foot, chronic). She exhibits no tenderness.  Neurological: She is alert and oriented to person, place, and time.  Skin: Skin is warm and dry. She is not diaphoretic.  Psychiatric: She has a normal mood and affect.    Labs reviewed: Basic Metabolic Panel:  Recent Labs  07/01/13 12/09/13  NA 142 142  K 4.7 4.4  BUN 24* 22*  CREATININE 1.0 0.9   Liver Function Tests:  Recent Labs  12/09/13  AST 23  ALT 19  ALKPHOS 46   No results for input(s): LIPASE, AMYLASE in the last 8760 hours. No results for input(s): AMMONIA in the last 8760 hours. CBC:  Recent Labs  07/01/13 12/09/13  WBC 5.7 7.3  HGB 13.9 14.0  HCT 41 41  PLT 232 217   TSH:  Recent Labs  07/01/13 12/09/13  TSH 1.93 4.58   A1C: No results found for: HGBA1C Lipid Panel:  Recent Labs  07/01/13 12/09/13  CHOL 187 182  HDL 75* 84*  LDLCALC  95 92  TRIG 50 68     Assessment/Plan  1. Hypothyroidism, unspecified hypothyroidism type -will follow up TSH due to irregular rate - levothyroxine (SYNTHROID, LEVOTHROID) 25 MCG tablet; TAKE ONE TABLET BY MOUTH ONCE DAILY  Dispense: 90 tablet; Refill: 0  2. Essential hypertension -well controlled on HCTZ, however due to tachycardia will start metoprolol 25 mg BID  - hydrochlorothiazide (HYDRODIURIL) 25 MG tablet; TAKE ONE TABLET BY MOUTH EVERY DAY TO  REDUCE  BLOOD  PRESSURE  Dispense: 90 tablet; Refill: 1  3. Senile osteoporosis -plans to have dexa scan done next month.   4. HYPERCHOLESTEROLEMIA conts lipitor, no adverse effects, will follow up cmp - atorvastatin (LIPITOR) 20 MG tablet; TAKE ONE  TABLET BY MOUTH ONCE DAILY TO TREAT HYPERLIPIDEMIA.  Dispense: 90 tablet; Refill: 1  5. Atrial fibrillation, unspecified -rate uncontrolled on exam, EKG confirms A fib -pt reports she has seen Dr Martinique Cardiologist in the past due to "enlarged heart" per pt. Will have her follow up with him (appt scheduled)  -CHADS score of 2, not a fall risk and has not had a hx of bleeding for now to start Xarelto 20 mg at supper, discussed risk vs benefits, pt willing to start anticoagulant but does not wish to be on coumadin.  -will start metoprolol tartrate for rate control.  - rivaroxaban (XARELTO) 20 MG TABS tablet; Take 1 tablet (20 mg total) by mouth daily with supper.  Dispense: 30 tablet; Refill: 3 - metoprolol tartrate (LOPRESSOR) 25 MG tablet; Take 1 tablet (25 mg total) by mouth 2 (two) times daily.  Dispense: 60 tablet; Refill: 3  6. Osteoarthritis Will have pt stop aleve for now (increased risk of bleeding) May take tylenol 650 mg every 6 hours as needed for pain   To follow up in 2 weeks regarding blood pressure and rate and new medications

## 2014-06-29 NOTE — Telephone Encounter (Signed)
Nurse from Dr. Doug Sou office called to let us know that they called the patient to get her appt with Dr. Radford Pax tomorrow, but the patient wants to wait on Dr. Martinique. They will call back with appt with Dr. Martinique

## 2014-06-30 DIAGNOSIS — E039 Hypothyroidism, unspecified: Secondary | ICD-10-CM | POA: Diagnosis not present

## 2014-06-30 DIAGNOSIS — R5383 Other fatigue: Secondary | ICD-10-CM | POA: Diagnosis not present

## 2014-06-30 DIAGNOSIS — I10 Essential (primary) hypertension: Secondary | ICD-10-CM | POA: Diagnosis not present

## 2014-06-30 LAB — BASIC METABOLIC PANEL
BUN: 20 mg/dL (ref 4–21)
Creatinine: 0.9 mg/dL (ref 0.5–1.1)
Glucose: 113 mg/dL
Potassium: 4.1 mmol/L (ref 3.4–5.3)
Sodium: 140 mmol/L (ref 137–147)

## 2014-06-30 LAB — HEPATIC FUNCTION PANEL
ALT: 15 U/L (ref 7–35)
AST: 18 U/L (ref 13–35)
Alkaline Phosphatase: 45 U/L (ref 25–125)
BILIRUBIN, TOTAL: 0.5 mg/dL

## 2014-06-30 LAB — TSH: TSH: 6.21 u[IU]/mL — AB (ref 0.41–5.90)

## 2014-06-30 LAB — CBC AND DIFFERENTIAL
HCT: 42 % (ref 36–46)
HEMOGLOBIN: 14.2 g/dL (ref 12.0–16.0)
PLATELETS: 260 10*3/uL (ref 150–399)
WBC: 5.5 10^3/mL

## 2014-07-01 ENCOUNTER — Encounter: Payer: Self-pay | Admitting: *Deleted

## 2014-07-01 ENCOUNTER — Telehealth: Payer: Self-pay | Admitting: *Deleted

## 2014-07-01 NOTE — Telephone Encounter (Signed)
Patient called received message needs sooner appointment with Dr.Jordan.Appointment scheduled with Dr.Jordan Wed 07/06/14 at 8:30 am.

## 2014-07-01 NOTE — Telephone Encounter (Signed)
Started prior Authorization process and completed, Prior Authorization was approved thru 07/02/15,  And reference number -[id number]

## 2014-07-06 ENCOUNTER — Ambulatory Visit (INDEPENDENT_AMBULATORY_CARE_PROVIDER_SITE_OTHER): Payer: Medicare Other | Admitting: Cardiology

## 2014-07-06 ENCOUNTER — Encounter: Payer: Self-pay | Admitting: Cardiology

## 2014-07-06 VITALS — BP 142/84 | HR 73 | Ht 62.0 in | Wt 192.2 lb

## 2014-07-06 DIAGNOSIS — I10 Essential (primary) hypertension: Secondary | ICD-10-CM | POA: Diagnosis not present

## 2014-07-06 DIAGNOSIS — I4891 Unspecified atrial fibrillation: Secondary | ICD-10-CM | POA: Insufficient documentation

## 2014-07-06 DIAGNOSIS — R0600 Dyspnea, unspecified: Secondary | ICD-10-CM | POA: Diagnosis not present

## 2014-07-06 DIAGNOSIS — E7800 Pure hypercholesterolemia, unspecified: Secondary | ICD-10-CM

## 2014-07-06 DIAGNOSIS — I4819 Other persistent atrial fibrillation: Secondary | ICD-10-CM

## 2014-07-06 DIAGNOSIS — I481 Persistent atrial fibrillation: Secondary | ICD-10-CM

## 2014-07-06 NOTE — Progress Notes (Signed)
Cheyenne Cruz Date of Birth: 06-Mar-1929 Medical Record #[id number]  History of Present Illness: Cheyenne Cruz is seen at the request of Dr. Nyoka Cowden for evaluation of atrial fibrillation. She is a pleasant 80 yo WF that I last saw in 2011 with symptoms of dyspnea. She had an Echo at that time that showed Normal LV function, AV sclerosis, and mild MR. A stress test showed very poor exercise tolerance. She had a cardiac cath that showed no CAD and normal right heart pressures. She was recently seen for a physical and was noted to have an irregular pulse. Rate was fast. She was unaware of change in rhythm. She denied any chest pain or palpitations. No dizziness. Maybe slight increase in SOB walking up hills. Found to be in Afib. Started on metoprolol and Xarelto. She is tolerating this well. No history of CVA or TIA. No bleeding.    Medication List       This list is accurate as of: 07/06/14 11:48 AM.  Always use your most recent med list.               atorvastatin 20 MG tablet  Commonly known as:  LIPITOR  TAKE ONE TABLET BY MOUTH ONCE DAILY TO TREAT HYPERLIPIDEMIA.     calcium carbonate 600 MG Tabs tablet  Commonly known as:  OS-CAL  Take 600 mg by mouth.     Fish Oil 1000 MG Caps  Take 1 capsule by mouth daily. Take 1 capsule once a day.     hydrochlorothiazide 25 MG tablet  Commonly known as:  HYDRODIURIL  TAKE ONE TABLET BY MOUTH EVERY DAY TO  REDUCE  BLOOD  PRESSURE     levothyroxine 25 MCG tablet  Commonly known as:  SYNTHROID, LEVOTHROID  TAKE ONE TABLET BY MOUTH ONCE DAILY     metoprolol tartrate 25 MG tablet  Commonly known as:  LOPRESSOR  Take 1 tablet (25 mg total) by mouth 2 (two) times daily.     multivitamin tablet  Take 1 tablet by mouth daily. Take 1 tablet once a day.     rivaroxaban 20 MG Tabs tablet  Commonly known as:  XARELTO  Take 1 tablet (20 mg total) by mouth daily with supper.     Vitamin D-3 1000 UNITS Caps  Take 1 capsule by mouth daily.          No Known Allergies  Past Medical History  Diagnosis Date  . Shortness of breath     WITH EXERTION  . Fatigue   . Hyperlipidemia   . Hypertension   . Obesity   . MVP (mitral valve prolapse)   . OA (osteoarthritis)   . Plantar fasciitis   . Osteoporosis, senile 08/15/2004  . Vitamin D deficiency   . H/O total hysterectomy   . Aortic valve sclerosis   . Overweight and obesity(278.0) 11/11/2012  . Thyroid disease   . Closed fracture of shaft of femur 11.14/2012  . Nontoxic uninodular goiter 08/06/2010  . Unspecified hypothyroidism 08/06/2010  . Myalgia and myositis, unspecified 08/06/2010  . Nonspecific abnormal results of pulmonary system function study 08/06/2010  . Other abnormal blood chemistry 06/25/2010  . Enthesopathy of unspecified site 03/19/2009  . Pain in joint, ankle and foot 12/28/2008  . Abnormality of gait 10/09/2008  . Unspecified asthma, with exacerbation 12/02/2005  . Unspecified essential hypertension 08/15/2004  . Atrial fibrillation, persistent     Past Surgical History  Procedure Laterality Date  . Skin cancer excision  2003    NOSE  . Tonsillectomy    . Foot fracture  1976  . Basal cell carcinoma excision  2006    Above left side of lip  . Abdominal hysterectomy  2001    Total Dr. Rexford Maus  . Fracture surgery  1976    Foot  . Fracture surgery Left 2012    hip ORIF  . Colonoscopy  09/27/2008    Dr. Sammuel Cooper normal    History   Social History  . Marital Status: Married    Spouse Name: N/A    Number of Children: N/A  . Years of Education: N/A   Occupational History  . RETIRED     ELEMENTARY SCHOOL TEACHER   Social History Main Topics  . Smoking status: Never Smoker   . Smokeless tobacco: Never Used  . Alcohol Use: 0.0 oz/week    3-4 Glasses of wine per week     Comment: OCCASIONAL GLASS OF WINE  . Drug Use: No  . Sexual Activity: None   Other Topics Concern  . None   Social History Narrative   Married 1954, Cheyenne Cruz.     Retired Pharmacist, hospital.    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 Spends summers in [place].    No smoking history    drinks minimal amount of alcohol.   Exercise predominantly weight training, walking dog    Family History  Problem Relation Age of Onset  . Stroke Mother 91    FOLLOWING A BROKEN HIP  . Stroke Father   . Aneurysm Father     Abdominal aortic, repaired    Review of Systems: As noted in HPI.  All other systems were reviewed and are negative.  Physical Exam: BP 142/84 mmHg  Pulse 73  Ht 5\' 2"  (1.575 m)  Wt 192 lb 3.2 oz (87.181 kg)  BMI 35.14 kg/m2 Filed Weights   07/06/14 0828  Weight: 192 lb 3.2 oz (87.181 kg)  GENERAL:  Well appearing obese WF in NAD. HEENT:  PERRL, EOMI, sclera are clear. Oropharynx is clear. NECK:  No jugular venous distention, carotid upstroke brisk and symmetric, no bruits, no thyromegaly or adenopathy LUNGS:  Clear to auscultation bilaterally CHEST:  Unremarkable HEART:  IRRR,  PMI not displaced or sustained,S1 and S2 within normal limits, no S3, no S4: no clicks, no rubs, no murmurs ABD:  Soft, nontender. BS +, no masses or bruits. No hepatomegaly, no splenomegaly EXT:  2 + pulses throughout, no edema, no cyanosis no clubbing. Right ankle chronically enlarged from old injury. SKIN:  Warm and dry.  No rashes NEURO:  Alert and oriented x 3. Cranial nerves II through XII intact. PSYCH:  Cognitively intact   LABORATORY DATA: Lab Results  Component Value Date   WBC 5.5 06/30/2014   HGB 14.2 06/30/2014   HCT 42 06/30/2014   PLT 260 06/30/2014   CHOL 182 12/09/2013   TRIG 68 12/09/2013   HDL 84* 12/09/2013   LDLCALC 92 12/09/2013   ALT 15 06/30/2014   AST 18 06/30/2014   NA 140 06/30/2014   K 4.1 06/30/2014   CREATININE 0.9 06/30/2014   BUN 20 06/30/2014   TSH 6.21* 06/30/2014   Ecg: today: atrial fibrillation with rate 73 bpm. Nonspecific ST abnormality.  Assessment / Plan: 1. Atrial  fibrillation persistent. Duration is unknown since patient asymptomatic. Rate currently well controlled on metoprolol. Agree with anticoatulation with Xarelto with elevated CHAD-vasc score of 4. Will obtain an Echo. I  will follow up at 4-6 weeks. If she remains largely symptomatic I would favor a strategy of rate control.  2. HTN controlled. 3. Chronic dyspnea related to obesity and deconditioning. Normal right and left heart cath in 2011. 4. Hypothyroidism. Recent TSH mildly elevated. Will defer adjustment of thyroid replacement to PCP.

## 2014-07-06 NOTE — Patient Instructions (Signed)
Continue your current therapy  We will schedule you for an echocardiogram  I will see you in 4-6 weeks.

## 2014-07-08 ENCOUNTER — Ambulatory Visit (HOSPITAL_COMMUNITY)
Admission: RE | Admit: 2014-07-08 | Discharge: 2014-07-08 | Disposition: A | Discharge status: Home / Self Care | Payer: Medicare Other | Source: Ambulatory Visit | Attending: Cardiology | Admitting: Cardiology

## 2014-07-08 ENCOUNTER — Encounter: Payer: Self-pay | Admitting: Geriatric Medicine

## 2014-07-08 DIAGNOSIS — E7800 Pure hypercholesterolemia, unspecified: Secondary | ICD-10-CM

## 2014-07-08 DIAGNOSIS — I4891 Unspecified atrial fibrillation: Secondary | ICD-10-CM | POA: Insufficient documentation

## 2014-07-08 DIAGNOSIS — E785 Hyperlipidemia, unspecified: Secondary | ICD-10-CM | POA: Insufficient documentation

## 2014-07-08 DIAGNOSIS — I4819 Other persistent atrial fibrillation: Secondary | ICD-10-CM

## 2014-07-08 DIAGNOSIS — I059 Rheumatic mitral valve disease, unspecified: Secondary | ICD-10-CM | POA: Diagnosis not present

## 2014-07-08 DIAGNOSIS — R0600 Dyspnea, unspecified: Secondary | ICD-10-CM

## 2014-07-08 DIAGNOSIS — I10 Essential (primary) hypertension: Secondary | ICD-10-CM | POA: Insufficient documentation

## 2014-07-08 NOTE — Progress Notes (Signed)
2D Echocardiogram Complete.  07/08/2014   Cheyenne Cruz, Cheyenne Cruz

## 2014-07-11 ENCOUNTER — Other Ambulatory Visit: Payer: Self-pay

## 2014-07-19 ENCOUNTER — Encounter: Payer: Self-pay | Admitting: Internal Medicine

## 2014-07-20 ENCOUNTER — Encounter: Payer: Self-pay | Admitting: Nurse Practitioner

## 2014-07-20 ENCOUNTER — Non-Acute Institutional Stay: Payer: Medicare Other | Admitting: Nurse Practitioner

## 2014-07-20 VITALS — BP 106/66 | HR 80 | Wt 191.0 lb

## 2014-07-20 DIAGNOSIS — E039 Hypothyroidism, unspecified: Secondary | ICD-10-CM | POA: Diagnosis not present

## 2014-07-20 DIAGNOSIS — M1990 Unspecified osteoarthritis, unspecified site: Secondary | ICD-10-CM | POA: Insufficient documentation

## 2014-07-20 DIAGNOSIS — I481 Persistent atrial fibrillation: Secondary | ICD-10-CM | POA: Diagnosis not present

## 2014-07-20 DIAGNOSIS — M170 Bilateral primary osteoarthritis of knee: Secondary | ICD-10-CM

## 2014-07-20 DIAGNOSIS — I4819 Other persistent atrial fibrillation: Secondary | ICD-10-CM

## 2014-07-20 DIAGNOSIS — I10 Essential (primary) hypertension: Secondary | ICD-10-CM | POA: Diagnosis not present

## 2014-07-20 MED ORDER — LEVOTHYROXINE SODIUM 50 MCG PO TABS: ORAL_TABLET | ORAL | Status: DC

## 2014-07-20 NOTE — Progress Notes (Signed)
Patient ID: Cheyenne Cruz, female   DOB: [birth year], 80 y.o.   MRN: [id number]    Nursing Home Location:  [place]   Place of Service: Clinic (12)  PCP: Estill Dooms, MD  No Known Allergies  Chief Complaint  Patient presents with  . Medical Management of Chronic Issues    check blood pressure rate, and evaluate after starting new medication    HPI:  Patient is a 80 y.o. female seen today at [facility] [place] living to follow up on Xarelto and lopressor.  Reports when she started medication she had diarrhea but this has resolved. Followed up with Dr Martin[place]. Rate now controlled. No bleeding noted with xarelto   Stopped aleve but tylenol is not as effective but does not want another prescription   TSH elevated, reports increase fatigue but also with new a fib.   Review of Systems:  Review of Systems  Constitutional: Negative for activity change, appetite change, fatigue and unexpected weight change.  HENT: Negative for congestion and hearing loss.   Eyes: Negative.   Respiratory: Positive for shortness of breath (on exertion ). Negative for cough.   Cardiovascular: Negative for chest pain, palpitations (rare) and leg swelling.  Gastrointestinal: Negative for abdominal pain, diarrhea and constipation.  Genitourinary: Negative for dysuria and difficulty urinating.       Nocturia, occ leakage, wears pad  Musculoskeletal: Negative for myalgias and arthralgias.  Skin: Negative for color change and wound.  Neurological: Negative for dizziness and weakness.  Psychiatric/Behavioral: Negative for behavioral problems, confusion and agitation.    Past Medical History  Diagnosis Date  . Shortness of breath     WITH EXERTION  . Fatigue   . Hyperlipidemia   . Hypertension   . Obesity   . MVP (mitral valve prolapse)   . OA (osteoarthritis)   . Plantar fasciitis   . Osteoporosis, senile 08/15/2004  . Vitamin D deficiency   .  H/O total hysterectomy   . Aortic valve sclerosis   . Overweight and obesity(278.0) 11/11/2012  . Thyroid disease   . Closed fracture of shaft of femur 11.14/2012  . Nontoxic uninodular goiter 08/06/2010  . Unspecified hypothyroidism 08/06/2010  . Myalgia and myositis, unspecified 08/06/2010  . Nonspecific abnormal results of pulmonary system function study 08/06/2010  . Other abnormal blood chemistry 06/25/2010  . Enthesopathy of unspecified site 03/19/2009  . Pain in joint, ankle and foot 12/28/2008  . Abnormality of gait 10/09/2008  . Unspecified asthma, with exacerbation 12/02/2005  . Unspecified essential hypertension 08/15/2004  . Atrial fibrillation, persistent    Past Surgical History  Procedure Laterality Date  . Skin cancer excision  2003    NOSE  . Tonsillectomy    . Foot fracture  1976  . Basal cell carcinoma excision  2006    Above left side of lip  . Abdominal hysterectomy  2001    Total Dr. Rexford Maus  . Fracture surgery  1976    Foot  . Fracture surgery Left 2012    hip ORIF  . Colonoscopy  09/27/2008    Dr. Sammuel Cooper normal   Social History:   reports that she has never smoked. She has never used smokeless tobacco. She reports that she drinks alcohol. She reports that she does not use illicit drugs.  Family History  Problem Relation Age of Onset  . Stroke Mother 51    FOLLOWING A BROKEN HIP  . Stroke Father   . Aneurysm Father  Abdominal aortic, repaired    Medications: Patient's Medications  New Prescriptions   No medications on file  Previous Medications   ATORVASTATIN (LIPITOR) 20 MG TABLET    TAKE ONE TABLET BY MOUTH ONCE DAILY TO TREAT HYPERLIPIDEMIA.   CALCIUM CARBONATE (OS-CAL) 600 MG TABS TABLET    Take 600 mg by mouth.    CHOLECALCIFEROL (VITAMIN D-3) 1000 UNITS CAPS    Take 1 capsule by mouth daily.    HYDROCHLOROTHIAZIDE (HYDRODIURIL) 25 MG TABLET    TAKE ONE TABLET BY MOUTH EVERY DAY TO  REDUCE  BLOOD  PRESSURE   LEVOTHYROXINE  (SYNTHROID, LEVOTHROID) 25 MCG TABLET    TAKE ONE TABLET BY MOUTH ONCE DAILY   METOPROLOL TARTRATE (LOPRESSOR) 25 MG TABLET    Take 1 tablet (25 mg total) by mouth 2 (two) times daily.   MULTIPLE VITAMIN (MULTIVITAMIN) TABLET    Take 1 tablet by mouth daily. Take 1 tablet once a day.   OMEGA-3 FATTY ACIDS (FISH OIL) 1000 MG CAPS    Take 1 capsule by mouth daily. Take 1 capsule once a day.   RIVAROXABAN (XARELTO) 20 MG TABS TABLET    Take 1 tablet (20 mg total) by mouth daily with supper.  Modified Medications   No medications on file  Discontinued Medications   No medications on file     Physical Exam: Filed Vitals:   07/20/14 1339  BP: 106/66  Pulse: 80  Weight: 191 lb (86.637 kg)  SpO2: 91%    Physical Exam  Constitutional: She is oriented to person, place, and time. She appears well-developed and well-nourished. No distress.  HENT:  Head: Normocephalic and atraumatic.  Mouth/Throat: Oropharynx is clear and moist. No oropharyngeal exudate.  Eyes: Conjunctivae are normal. Pupils are equal, round, and reactive to light.  Neck: Normal range of motion. Neck supple.  Cardiovascular: Normal rate.  An irregular rhythm present.  Pulmonary/Chest: Effort normal and breath sounds normal.  Abdominal: Soft. Bowel sounds are normal.  Musculoskeletal: Normal range of motion. She exhibits edema (to right foot, chronic). She exhibits no tenderness.  Neurological: She is alert and oriented to person, place, and time.  Skin: Skin is warm and dry. She is not diaphoretic.  Psychiatric: She has a normal mood and affect.    Labs reviewed: Basic Metabolic Panel:  Recent Labs  12/09/13 06/30/14  NA 142 140  K 4.4 4.1  BUN 22* 20  CREATININE 0.9 0.9   Liver Function Tests:  Recent Labs  12/09/13 06/30/14  AST 23 18  ALT 19 15  ALKPHOS 46 45   No results for input(s): LIPASE, AMYLASE in the last 8760 hours. No results for input(s): AMMONIA in the last 8760 hours. CBC:  Recent Labs   12/09/13 06/30/14  WBC 7.3 5.5  HGB 14.0 14.2  HCT 41 42  PLT 217 260   TSH:  Recent Labs  12/09/13 06/30/14  TSH 4.58 6.21*   A1C: No results found for: HGBA1C Lipid Panel:  Recent Labs  12/09/13  CHOL 182  HDL 84*  LDLCALC 92  TRIG 68     Assessment/Plan  1. Hypothyroidism, unspecified hypothyroidism type -TSH elevated, will increase synthroid and follow up TSH in 8 weeks - levothyroxine (SYNTHROID, LEVOTHROID) 50 MCG tablet; TAKE ONE TABLET BY MOUTH ONCE DAILY  Dispense: 30 tablet; Refill: 3  2. Persistent atrial fibrillation -remains in a fib, rate controlled on metoprolol  -conts xarelto for anticoagulation -conts to follow up with cardiology  3. Essential hypertension  Tolerating metoprolol dose, will cont at this time  4. Primary osteoarthritis of both knees -tylenol not as effective as aleve but does not want additional prescription medication  -may use muscle rubs/Biofreeze   Will follow up in 2 months on thyroid

## 2014-07-21 ENCOUNTER — Telehealth: Payer: Self-pay | Admitting: Cardiology

## 2014-07-21 NOTE — Telephone Encounter (Signed)
Returned call to patient echo results given.Advised to keep appointment with Dr.Jordan 08/22/13 at 11:45 am.

## 2014-07-21 NOTE — Telephone Encounter (Signed)
Returning your call. °

## 2014-08-02 DIAGNOSIS — L918 Other hypertrophic disorders of the skin: Secondary | ICD-10-CM | POA: Diagnosis not present

## 2014-08-02 DIAGNOSIS — L814 Other melanin hyperpigmentation: Secondary | ICD-10-CM | POA: Diagnosis not present

## 2014-08-02 DIAGNOSIS — D1801 Hemangioma of skin and subcutaneous tissue: Secondary | ICD-10-CM | POA: Diagnosis not present

## 2014-08-02 DIAGNOSIS — L821 Other seborrheic keratosis: Secondary | ICD-10-CM | POA: Diagnosis not present

## 2014-08-02 DIAGNOSIS — Z85828 Personal history of other malignant neoplasm of skin: Secondary | ICD-10-CM | POA: Diagnosis not present

## 2014-08-02 DIAGNOSIS — D2372 Other benign neoplasm of skin of left lower limb, including hip: Secondary | ICD-10-CM | POA: Diagnosis not present

## 2014-08-22 ENCOUNTER — Ambulatory Visit (INDEPENDENT_AMBULATORY_CARE_PROVIDER_SITE_OTHER): Payer: Medicare Other | Admitting: Cardiology

## 2014-08-22 ENCOUNTER — Encounter: Payer: Self-pay | Admitting: Cardiology

## 2014-08-22 VITALS — BP 124/84 | HR 84 | Ht 62.0 in | Wt 192.5 lb

## 2014-08-22 DIAGNOSIS — I481 Persistent atrial fibrillation: Secondary | ICD-10-CM

## 2014-08-22 DIAGNOSIS — I4819 Other persistent atrial fibrillation: Secondary | ICD-10-CM

## 2014-08-22 DIAGNOSIS — I10 Essential (primary) hypertension: Secondary | ICD-10-CM | POA: Diagnosis not present

## 2014-08-22 NOTE — Patient Instructions (Signed)
Continue your current therapy  I will see you in 6 months.   

## 2014-08-22 NOTE — Progress Notes (Signed)
Gerald Leitz Date of Birth: 10-09-1928 Medical Record #[id number]  History of Present Illness: Cheyenne Cruz is seen for follow up of atrial fibrillation. She was seen in 2011 with symptoms of dyspnea. She had an Echo at that time that showed Normal LV function, AV sclerosis, and mild MR. A stress test showed very poor exercise tolerance. She had a cardiac cath that showed no CAD and normal right heart pressures. More recently she was seen for a physical and was noted to have an irregular pulse and was found to be in atrial fibrillation.  She was unaware of change in rhythm. She denied any chest pain or palpitations. No dizziness.  Started on metoprolol and Xarelto. She is tolerating this well. No history of CVA or TIA. No bleeding. Today she states she is still asymptomatic. She did have diarrhea when she was initially started on these new meds but this has since resolved.     Medication List       This list is accurate as of: 08/22/14  1:11 PM.  Always use your most recent med list.               atorvastatin 20 MG tablet  Commonly known as:  LIPITOR  TAKE ONE TABLET BY MOUTH ONCE DAILY TO TREAT HYPERLIPIDEMIA.     calcium carbonate 600 MG Tabs tablet  Commonly known as:  OS-CAL  Take 600 mg by mouth.     Fish Oil 1000 MG Caps  Take 1 capsule by mouth daily. Take 1 capsule once a day.     hydrochlorothiazide 25 MG tablet  Commonly known as:  HYDRODIURIL  TAKE ONE TABLET BY MOUTH EVERY DAY TO  REDUCE  BLOOD  PRESSURE     levothyroxine 50 MCG tablet  Commonly known as:  SYNTHROID, LEVOTHROID  TAKE ONE TABLET BY MOUTH ONCE DAILY     metoprolol tartrate 25 MG tablet  Commonly known as:  LOPRESSOR  Take 1 tablet (25 mg total) by mouth 2 (two) times daily.     multivitamin tablet  Take 1 tablet by mouth daily. Take 1 tablet once a day.     rivaroxaban 20 MG Tabs tablet  Commonly known as:  XARELTO  Take 1 tablet (20 mg total) by mouth daily with supper.     Vitamin D-3  1000 UNITS Caps  Take 1 capsule by mouth daily.        No Known Allergies  Past Medical History  Diagnosis Date  . Shortness of breath     WITH EXERTION  . Fatigue   . Hyperlipidemia   . Hypertension   . Obesity   . MVP (mitral valve prolapse)   . OA (osteoarthritis)   . Plantar fasciitis   . Osteoporosis, senile 08/15/2004  . Vitamin D deficiency   . H/O total hysterectomy   . Aortic valve sclerosis   . Overweight and obesity(278.0) 11/11/2012  . Thyroid disease   . Closed fracture of shaft of femur 11.14/2012  . Nontoxic uninodular goiter 08/06/2010  . Unspecified hypothyroidism 08/06/2010  . Myalgia and myositis, unspecified 08/06/2010  . Nonspecific abnormal results of pulmonary system function study 08/06/2010  . Other abnormal blood chemistry 06/25/2010  . Enthesopathy of unspecified site 03/19/2009  . Pain in joint, ankle and foot 12/28/2008  . Abnormality of gait 10/09/2008  . Unspecified asthma, with exacerbation 12/02/2005  . Unspecified essential hypertension 08/15/2004  . Atrial fibrillation, persistent     Past Surgical History  Procedure  Laterality Date  . Skin cancer excision  2003    NOSE  . Tonsillectomy    . Foot fracture  1976  . Basal cell carcinoma excision  2006    Above left side of lip  . Abdominal hysterectomy  2001    Total Dr. Rexford Maus  . Fracture surgery  1976    Foot  . Fracture surgery Left 2012    hip ORIF  . Colonoscopy  09/27/2008    Dr. Sammuel Cooper normal    History   Social History  . Marital Status: Married    Spouse Name: N/A    Number of Children: N/A  . Years of Education: N/A   Occupational History  . RETIRED     ELEMENTARY SCHOOL TEACHER   Social History Main Topics  . Smoking status: Never Smoker   . Smokeless tobacco: Never Used  . Alcohol Use: 0.0 oz/week    3-4 Glasses of wine per week     Comment: OCCASIONAL GLASS OF WINE  . Drug Use: No  . Sexual Activity: None   Other Topics Concern  . None    Social History Narrative   Married 1954, [name].    Retired Pharmacist, hospital.    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 Spends summers in [place].    No smoking history    drinks minimal amount of alcohol.   Exercise predominantly weight training, walking dog    Family History  Problem Relation Age of Onset  . Stroke Mother 70    FOLLOWING A BROKEN HIP  . Stroke Father   . Aneurysm Father     Abdominal aortic, repaired    Review of Systems: As noted in HPI.  All other systems were reviewed and are negative.  Physical Exam: BP 124/84 mmHg  Pulse 84  Ht 5\' 2"  (1.575 m)  Wt 192 lb 8 oz (87.317 kg)  BMI 35.20 kg/m2 Filed Weights   08/22/14 1215  Weight: 192 lb 8 oz (87.317 kg)  GENERAL:  Well appearing obese WF in NAD. HEENT:  PERRL, EOMI, sclera are clear. Oropharynx is clear. NECK:  No jugular venous distention, carotid upstroke brisk and symmetric, no bruits, no thyromegaly or adenopathy LUNGS:  Clear to auscultation bilaterally CHEST:  Unremarkable HEART:  IRRR,  PMI not displaced or sustained,S1 and S2 within normal limits, no S3, no S4: no clicks, no rubs, no murmurs ABD:  Soft, nontender. BS +, no masses or bruits. No hepatomegaly, no splenomegaly EXT:  2 + pulses throughout, no edema, no cyanosis no clubbing. Right ankle chronically enlarged from old injury. SKIN:  Warm and dry.  No rashes NEURO:  Alert and oriented x 3. Cranial nerves II through XII intact. PSYCH:  Cognitively intact   LABORATORY DATA: Lab Results  Component Value Date   WBC 5.5 06/30/2014   HGB 14.2 06/30/2014   HCT 42 06/30/2014   PLT 260 06/30/2014   CHOL 182 12/09/2013   TRIG 68 12/09/2013   HDL 84* 12/09/2013   LDLCALC 92 12/09/2013   ALT 15 06/30/2014   AST 18 06/30/2014   NA 140 06/30/2014   K 4.1 06/30/2014   CREATININE 0.9 06/30/2014   BUN 20 06/30/2014   TSH 6.21* 06/30/2014   Ecg: today: atrial fibrillation with rate 84 bpm.  Nonspecific ST abnormality. Possible old septal infarct. I have personally reviewed and interpreted this study.   Echo: 07/08/14:Study Conclusions  - Left ventricle: The cavity size was normal. Wall thickness  was normal. Systolic function was normal. The estimated ejection fraction was in the range of 55% to 60%. Wall motion was normal; there were no regional wall motion abnormalities. - Aortic valve: There was trivial regurgitation. - Mitral valve: Systolic bowing without prolapse. There was mild to moderate regurgitation directed centrally. - Left atrium: The atrium was moderately dilated. - Right atrium: The atrium was mildly dilated. - Atrial septum: No defect or patent foramen ovale was identified. - Pulmonary arteries: PA peak pressure: 35 mm Hg (S).  Assessment / Plan: 1. Atrial fibrillation persistent and probably now chronic.  Rate currently well controlled on metoprolol. anticoagulated with Xarelto with elevated CHAD-vasc score of 4. Since she remains largely symptomatic I would favor a strategy of rate control.  2. HTN controlled. 3. Chronic dyspnea related to obesity and deconditioning. Normal right and left heart cath in 2011. 4. Hypothyroidism. Recent TSH mildly elevated. Will defer adjustment of thyroid replacement to PCP.  I will follow up in 6 months.

## 2014-09-02 ENCOUNTER — Ambulatory Visit: Payer: Self-pay | Admitting: Cardiology

## 2014-09-06 DIAGNOSIS — E079 Disorder of thyroid, unspecified: Secondary | ICD-10-CM | POA: Diagnosis not present

## 2014-09-06 LAB — TSH: TSH: 2.4 u[IU]/mL (ref 0.41–5.90)

## 2014-09-14 ENCOUNTER — Telehealth: Payer: Self-pay | Admitting: *Deleted

## 2014-09-14 ENCOUNTER — Non-Acute Institutional Stay: Payer: Medicare Other | Admitting: Nurse Practitioner

## 2014-09-14 ENCOUNTER — Encounter: Payer: Self-pay | Admitting: Nurse Practitioner

## 2014-09-14 VITALS — BP 130/78 | HR 80 | Temp 97.6°F | Wt 188.0 lb

## 2014-09-14 DIAGNOSIS — I481 Persistent atrial fibrillation: Secondary | ICD-10-CM

## 2014-09-14 DIAGNOSIS — E039 Hypothyroidism, unspecified: Secondary | ICD-10-CM | POA: Diagnosis not present

## 2014-09-14 DIAGNOSIS — I4819 Other persistent atrial fibrillation: Secondary | ICD-10-CM

## 2014-09-14 NOTE — Progress Notes (Signed)
Patient ID: Cheyenne Cruz, female   DOB: 1929-01-05, 81 y.o.   MRN: [id number]    Nursing Home Location:  [place]   Place of Service: Clinic (12)  PCP: Estill Dooms, MD  No Known Allergies  Chief Complaint  Patient presents with  . Medical Management of Chronic Issues    thyroid, A-Fib, blood pressure    HPI:  Patient is a 81 y.o. female seen today at [facility], [place] living to follow up on her TSH level. synthroid dose was increased in December.  Today's TSH is 2.4.  She reports still feeling tired but is still able to stay active.   She is concerned today that her Xarelto is costing too much.  While she pays $35 she is concerned the cost of the drug is too high and will put her into the doughnut hole too quickly.   Review of Systems:  Review of Systems  Constitutional: Negative for fever, activity change, appetite change and unexpected weight change.  HENT: Negative for congestion and hearing loss.   Eyes: Negative.   Respiratory: Negative for cough, shortness of breath (on exertion ) and wheezing.   Cardiovascular: Negative for chest pain, palpitations (rare) and leg swelling.  Endocrine: Negative for cold intolerance.  Skin: Negative for color change, pallor, rash and wound.  Neurological: Negative for dizziness, weakness and light-headedness.  Psychiatric/Behavioral: Negative for behavioral problems, confusion and agitation.    Past Medical History  Diagnosis Date  . Shortness of breath     WITH EXERTION  . Fatigue   . Hyperlipidemia   . Hypertension   . Obesity   . MVP (mitral valve prolapse)   . OA (osteoarthritis)   . Plantar fasciitis   . Osteoporosis, senile 08/15/2004  . Vitamin D deficiency   . H/O total hysterectomy   . Aortic valve sclerosis   . Overweight and obesity(278.0) 11/11/2012  . Thyroid disease   . Closed fracture of shaft of femur 11.14/2012  . Nontoxic uninodular goiter 08/06/2010  .  Unspecified hypothyroidism 08/06/2010  . Myalgia and myositis, unspecified 08/06/2010  . Nonspecific abnormal results of pulmonary system function study 08/06/2010  . Other abnormal blood chemistry 06/25/2010  . Enthesopathy of unspecified site 03/19/2009  . Pain in joint, ankle and foot 12/28/2008  . Abnormality of gait 10/09/2008  . Unspecified asthma, with exacerbation 12/02/2005  . Unspecified essential hypertension 08/15/2004  . Atrial fibrillation, persistent    Past Surgical History  Procedure Laterality Date  . Skin cancer excision  2003    NOSE  . Tonsillectomy    . Foot fracture  1976  . Basal cell carcinoma excision  2006    Above left side of lip  . Abdominal hysterectomy  2001    Total Dr. Rexford Maus  . Fracture surgery  1976    Foot  . Fracture surgery Left 2012    hip ORIF  . Colonoscopy  09/27/2008    Dr. Sammuel Cooper normal   Social History:   reports that she has never smoked. She has never used smokeless tobacco. She reports that she drinks alcohol. She reports that she does not use illicit drugs.  Family History  Problem Relation Age of Onset  . Stroke Mother 53    FOLLOWING A BROKEN HIP  . Stroke Father   . Aneurysm Father     Abdominal aortic, repaired    Medications: Patient's Medications  New Prescriptions   No medications on file  Previous Medications  ATORVASTATIN (LIPITOR) 20 MG TABLET    TAKE ONE TABLET BY MOUTH ONCE DAILY TO TREAT HYPERLIPIDEMIA.   CALCIUM CARBONATE (OS-CAL) 600 MG TABS TABLET    Take 600 mg by mouth.    CHOLECALCIFEROL (VITAMIN D-3) 1000 UNITS CAPS    Take 1 capsule by mouth daily.    HYDROCHLOROTHIAZIDE (HYDRODIURIL) 25 MG TABLET    TAKE ONE TABLET BY MOUTH EVERY DAY TO  REDUCE  BLOOD  PRESSURE   LEVOTHYROXINE (SYNTHROID, LEVOTHROID) 50 MCG TABLET    TAKE ONE TABLET BY MOUTH ONCE DAILY   METOPROLOL TARTRATE (LOPRESSOR) 25 MG TABLET    Take 1 tablet (25 mg total) by mouth 2 (two) times daily.   MULTIPLE VITAMIN (MULTIVITAMIN)  TABLET    Take 1 tablet by mouth daily. Take 1 tablet once a day.   OMEGA-3 FATTY ACIDS (FISH OIL) 1000 MG CAPS    Take 1 capsule by mouth daily. Take 1 capsule once a day.   RIVAROXABAN (XARELTO) 20 MG TABS TABLET    Take 1 tablet (20 mg total) by mouth daily with supper.  Modified Medications   No medications on file  Discontinued Medications   No medications on file     Physical Exam: Filed Vitals:   09/14/14 1334  BP: 130/78  Pulse: 80  Temp: 97.6 F (36.4 C)  TempSrc: Oral  Weight: 188 lb (85.276 kg)    Physical Exam  Constitutional: She is oriented to person, place, and time. She appears well-developed and well-nourished. No distress.  HENT:  Head: Normocephalic and atraumatic.  Mouth/Throat: Oropharynx is clear and moist. No oropharyngeal exudate.  Eyes: Conjunctivae are normal. Pupils are equal, round, and reactive to light.  Neck: Normal range of motion. Neck supple.  Cardiovascular: Normal rate.  An irregular rhythm present.  Pulmonary/Chest: Effort normal and breath sounds normal.  Abdominal: Soft. Bowel sounds are normal.  Musculoskeletal: Normal range of motion. She exhibits edema (to right foot, chronic). She exhibits no tenderness.  Neurological: She is alert and oriented to person, place, and time.  Skin: Skin is warm and dry. She is not diaphoretic.  Psychiatric: She has a normal mood and affect.    Labs reviewed: Basic Metabolic Panel:  Recent Labs  12/09/13 06/30/14  NA 142 140  K 4.4 4.1  BUN 22* 20  CREATININE 0.9 0.9   Liver Function Tests:  Recent Labs  12/09/13 06/30/14  AST 23 18  ALT 19 15  ALKPHOS 46 45   No results for input(s): LIPASE, AMYLASE in the last 8760 hours. No results for input(s): AMMONIA in the last 8760 hours. CBC:  Recent Labs  12/09/13 06/30/14  WBC 7.3 5.5  HGB 14.0 14.2  HCT 41 42  PLT 217 260   TSH:  Recent Labs  12/09/13 06/30/14 09/06/14  TSH 4.58 6.21* 2.40   A1C: No results found for:  HGBA1C Lipid Panel:  Recent Labs  12/09/13  CHOL 182  HDL 84*  LDLCALC 92  TRIG 68     Assessment/Plan  1. Hypothyroidism, unspecified hypothyroidism type TSH improved to 2.4.  Will continue the same dose of medication, discussed compliance with taking medication at the time time of day.    2. Atrial Fibrillation  Rate controlled, conts medications as well as anticoagulant. Discussed alternative medications with pt as well as coumadin. Pt does not wish to change now but Possibly would like to transition to coumadin at next visit due to cost of Xarelto.   Follow up in  6 weeks for routine follow up.

## 2014-09-14 NOTE — Telephone Encounter (Signed)
Received Fax order form from [facility] [phone] for patient to follow up to 2013/Osteoporosis. Given to Dr. Nyoka Cowden to review and sign and fax back to [facility] Fax: [phone]

## 2014-09-15 DIAGNOSIS — M810 Age-related osteoporosis without current pathological fracture: Secondary | ICD-10-CM | POA: Diagnosis not present

## 2014-09-15 LAB — HM DEXA SCAN

## 2014-10-06 ENCOUNTER — Telehealth: Payer: Self-pay

## 2014-10-06 NOTE — Telephone Encounter (Signed)
Spoke with Cheyenne Cruz that the Bone Density done 09/15/14 no change from 07/10/12.

## 2014-10-18 DIAGNOSIS — I481 Persistent atrial fibrillation: Secondary | ICD-10-CM | POA: Diagnosis not present

## 2014-10-18 DIAGNOSIS — R5383 Other fatigue: Secondary | ICD-10-CM | POA: Diagnosis not present

## 2014-10-18 DIAGNOSIS — E039 Hypothyroidism, unspecified: Secondary | ICD-10-CM | POA: Diagnosis not present

## 2014-10-18 LAB — BASIC METABOLIC PANEL
BUN: 26 mg/dL — AB (ref 4–21)
CREATININE: 1.3 mg/dL — AB (ref 0.5–1.1)
GLUCOSE: 115 mg/dL
Potassium: 4.1 mmol/L (ref 3.4–5.3)
SODIUM: 146 mmol/L (ref 137–147)

## 2014-10-18 LAB — CBC AND DIFFERENTIAL
HCT: 44 % (ref 36–46)
Hemoglobin: 14 g/dL (ref 12.0–16.0)
Platelets: 207 10*3/uL (ref 150–399)
WBC: 6.7 10*3/mL

## 2014-10-26 ENCOUNTER — Non-Acute Institutional Stay: Payer: Medicare Other | Admitting: Nurse Practitioner

## 2014-10-26 ENCOUNTER — Encounter: Payer: Self-pay | Admitting: Nurse Practitioner

## 2014-10-26 VITALS — BP 124/80 | HR 76 | Temp 97.3°F | Wt 192.0 lb

## 2014-10-26 DIAGNOSIS — I481 Persistent atrial fibrillation: Secondary | ICD-10-CM

## 2014-10-26 DIAGNOSIS — M810 Age-related osteoporosis without current pathological fracture: Secondary | ICD-10-CM

## 2014-10-26 DIAGNOSIS — E039 Hypothyroidism, unspecified: Secondary | ICD-10-CM

## 2014-10-26 DIAGNOSIS — I10 Essential (primary) hypertension: Secondary | ICD-10-CM | POA: Diagnosis not present

## 2014-10-26 DIAGNOSIS — I4819 Other persistent atrial fibrillation: Secondary | ICD-10-CM

## 2014-10-26 NOTE — Progress Notes (Signed)
Patient ID: Cheyenne Cruz, female   DOB: June 26, 1929, 81 y.o.   MRN: [id number]    Nursing Home Location:  [place]   Place of Service: Clinic (12)  PCP: Estill Dooms, MD  No Known Allergies  Chief Complaint  Patient presents with  . Medical Management of Chronic Issues    thyroid, A-Fib    HPI:  Patient is a 81 y.o. female seen today at [facility], [place] living for routine follow up on chronic conditions. She has a history of hypertension, a fib, hypothyroidism, and osteoarthritis.  She would still like to consider switching to coumadin eventually, but would not like to do that today.  No current complaints or concerns.    Review of Systems:  Review of Systems  Constitutional: Negative for fever, activity change, appetite change and unexpected weight change.  HENT: Negative for congestion and hearing loss.   Eyes: Negative.   Respiratory: Negative for cough, shortness of breath (on exertion ) and wheezing.   Cardiovascular: Negative for chest pain, palpitations (rare) and leg swelling.  Endocrine: Negative for cold intolerance.  Skin: Negative for color change, pallor, rash and wound.  Neurological: Negative for dizziness, weakness and light-headedness.  Psychiatric/Behavioral: Negative for behavioral problems, confusion and agitation.    Past Medical History  Diagnosis Date  . Shortness of breath     WITH EXERTION  . Fatigue   . Hyperlipidemia   . Hypertension   . Obesity   . MVP (mitral valve prolapse)   . OA (osteoarthritis)   . Plantar fasciitis   . Osteoporosis, senile 08/15/2004  . Vitamin D deficiency   . H/O total hysterectomy   . Aortic valve sclerosis   . Overweight and obesity(278.0) 11/11/2012  . Thyroid disease   . Closed fracture of shaft of femur 11.14/2012  . Nontoxic uninodular goiter 08/06/2010  . Unspecified hypothyroidism 08/06/2010  . Myalgia and myositis, unspecified 08/06/2010  .  Nonspecific abnormal results of pulmonary system function study 08/06/2010  . Other abnormal blood chemistry 06/25/2010  . Enthesopathy of unspecified site 03/19/2009  . Pain in joint, ankle and foot 12/28/2008  . Abnormality of gait 10/09/2008  . Unspecified asthma, with exacerbation 12/02/2005  . Unspecified essential hypertension 08/15/2004  . Atrial fibrillation, persistent    Past Surgical History  Procedure Laterality Date  . Skin cancer excision  2003    NOSE  . Tonsillectomy    . Foot fracture  1976  . Basal cell carcinoma excision  2006    Above left side of lip  . Abdominal hysterectomy  2001    Total Dr. Rexford Maus  . Fracture surgery  1976    Foot  . Fracture surgery Left 2012    hip ORIF  . Colonoscopy  09/27/2008    Dr. Sammuel Cooper normal   Social History:   reports that she has never smoked. She has never used smokeless tobacco. She reports that she drinks alcohol. She reports that she does not use illicit drugs.  Family History  Problem Relation Age of Onset  . Stroke Mother 11    FOLLOWING A BROKEN HIP  . Stroke Father   . Aneurysm Father     Abdominal aortic, repaired    Medications: Patient's Medications  New Prescriptions   No medications on file  Previous Medications   ATORVASTATIN (LIPITOR) 20 MG TABLET    TAKE ONE TABLET BY MOUTH ONCE DAILY TO TREAT HYPERLIPIDEMIA.   CALCIUM CARBONATE (OS-CAL) 600 MG TABS  TABLET    Take 600 mg by mouth.    CHOLECALCIFEROL (VITAMIN D-3) 1000 UNITS CAPS    Take 1 capsule by mouth daily.    HYDROCHLOROTHIAZIDE (HYDRODIURIL) 25 MG TABLET    TAKE ONE TABLET BY MOUTH EVERY DAY TO  REDUCE  BLOOD  PRESSURE   LEVOTHYROXINE (SYNTHROID, LEVOTHROID) 50 MCG TABLET    TAKE ONE TABLET BY MOUTH ONCE DAILY   METOPROLOL TARTRATE (LOPRESSOR) 25 MG TABLET    Take 1 tablet (25 mg total) by mouth 2 (two) times daily.   MULTIPLE VITAMIN (MULTIVITAMIN) TABLET    Take 1 tablet by mouth daily. Take 1 tablet once a day.   OMEGA-3 FATTY ACIDS  (FISH OIL) 1000 MG CAPS    Take 1 capsule by mouth daily. Take 1 capsule once a day.   RIVAROXABAN (XARELTO) 20 MG TABS TABLET    Take 1 tablet (20 mg total) by mouth daily with supper.  Modified Medications   No medications on file  Discontinued Medications   No medications on file     Physical Exam: Filed Vitals:   10/26/14 1051  BP: 124/80  Pulse: 76  Temp: 97.3 F (36.3 C)  TempSrc: Oral  Weight: 192 lb (87.091 kg)  SpO2: 97%    Physical Exam  Constitutional: She is oriented to person, place, and time. She appears well-developed and well-nourished. No distress.  HENT:  Head: Normocephalic and atraumatic.  Mouth/Throat: Oropharynx is clear and moist. No oropharyngeal exudate.  Eyes: Conjunctivae are normal. Pupils are equal, round, and reactive to light.  Neck: Normal range of motion. Neck supple.  Cardiovascular: Normal rate.  An irregular rhythm present.  Pulmonary/Chest: Effort normal and breath sounds normal.  Abdominal: Soft. Bowel sounds are normal.  Musculoskeletal: Normal range of motion. She exhibits edema (to right foot, chronic). She exhibits no tenderness.  Neurological: She is alert and oriented to person, place, and time.  Skin: Skin is warm and dry. She is not diaphoretic.  Psychiatric: She has a normal mood and affect.    Labs reviewed: Basic Metabolic Panel:  Recent Labs  12/09/13 06/30/14 10/18/14  NA 142 140 146  K 4.4 4.1 4.1  BUN 22* 20 26*  CREATININE 0.9 0.9 1.3*   Liver Function Tests:  Recent Labs  12/09/13 06/30/14  AST 23 18  ALT 19 15  ALKPHOS 46 45   No results for input(s): LIPASE, AMYLASE in the last 8760 hours. No results for input(s): AMMONIA in the last 8760 hours. CBC:  Recent Labs  12/09/13 06/30/14 10/18/14  WBC 7.3 5.5 6.7  HGB 14.0 14.2 14.0  HCT 41 42 44  PLT 217 260 207   TSH:  Recent Labs  12/09/13 06/30/14 09/06/14  TSH 4.58 6.21* 2.40   A1C: No results found for: HGBA1C Lipid Panel:  Recent  Labs  12/09/13  CHOL 182  HDL 84*  LDLCALC 92  TRIG 68     Assessment/Plan 1. Afib  Continue Xarelto. rhythm is irregular but rate controlled. Will cont current therapy. CBC reviewed with pt   2. Hypothyroidism  Continue Synthroid 29mcg daily. TSH stable in January, will recheck before she goes to [place] for the summer.     3. Hypertension  Blood pressures are within desirable range.  Continue HCTZ.  however sodium was 136 and creatinine was 1.3 according to recent labs. Admits she probably does not drink enough water.   Instructed patient to increase fluid intake to 6- 8 glasses of water per  day if able to tolerate.  BMP reviewed with pt  4. Osteoporosis  Taking calcium and vitamin D.  She had a DEXA scan in January, reviewed with pt.  There was no statistical difference in the change since last DEXA scan.  She was on 3 years of osteoporosis therapy previously, but does not wish to continue this.    Follow up in 4 weeks. Repeat BMET before appointment.

## 2014-10-28 ENCOUNTER — Other Ambulatory Visit: Payer: Self-pay | Admitting: *Deleted

## 2014-10-28 ENCOUNTER — Other Ambulatory Visit: Payer: Self-pay | Admitting: Nurse Practitioner

## 2014-10-28 DIAGNOSIS — I4891 Unspecified atrial fibrillation: Secondary | ICD-10-CM

## 2014-10-28 MED ORDER — RIVAROXABAN 20 MG PO TABS: 20.0000 mg | ORAL_TABLET | Freq: Every day | ORAL | Status: DC

## 2014-10-28 NOTE — Telephone Encounter (Signed)
[facility] 

## 2014-11-15 DIAGNOSIS — I10 Essential (primary) hypertension: Secondary | ICD-10-CM | POA: Diagnosis not present

## 2014-11-15 LAB — BASIC METABOLIC PANEL
BUN: 19 mg/dL (ref 4–21)
Creatinine: 1 mg/dL (ref 0.5–1.1)
GLUCOSE: 105 mg/dL
Potassium: 4.2 mmol/L (ref 3.4–5.3)
SODIUM: 141 mmol/L (ref 137–147)

## 2014-11-18 ENCOUNTER — Other Ambulatory Visit: Payer: Self-pay | Admitting: Nurse Practitioner

## 2014-11-23 ENCOUNTER — Non-Acute Institutional Stay: Payer: Medicare Other | Admitting: Nurse Practitioner

## 2014-11-23 ENCOUNTER — Encounter: Payer: Self-pay | Admitting: Nurse Practitioner

## 2014-11-23 VITALS — BP 126/72 | HR 68 | Temp 97.4°F | Wt 192.0 lb

## 2014-11-23 DIAGNOSIS — H52203 Unspecified astigmatism, bilateral: Secondary | ICD-10-CM | POA: Diagnosis not present

## 2014-11-23 DIAGNOSIS — I10 Essential (primary) hypertension: Secondary | ICD-10-CM

## 2014-11-23 DIAGNOSIS — E7800 Pure hypercholesterolemia, unspecified: Secondary | ICD-10-CM

## 2014-11-23 DIAGNOSIS — H25013 Cortical age-related cataract, bilateral: Secondary | ICD-10-CM | POA: Diagnosis not present

## 2014-11-23 DIAGNOSIS — R739 Hyperglycemia, unspecified: Secondary | ICD-10-CM | POA: Diagnosis not present

## 2014-11-23 DIAGNOSIS — I4891 Unspecified atrial fibrillation: Secondary | ICD-10-CM

## 2014-11-23 MED ORDER — RIVAROXABAN 20 MG PO TABS: 20.0000 mg | ORAL_TABLET | Freq: Every day | ORAL | Status: DC

## 2014-11-23 NOTE — Progress Notes (Signed)
Patient ID: Cheyenne Cruz, female   DOB: 11-04-1928, 81 y.o.   MRN: [id number]    Nursing Home Location:  [place]   Place of Service: Clinic (12)  PCP: Estill Dooms, MD  No Known Allergies  Chief Complaint  Patient presents with  . Medical Management of Chronic Issues    blood pressure, A-Fib, thyroid    HPI:  Patient is a 81 y.o. female seen today at [facility], [place] living for routine follow up on blood pressure and lab work. She has a history of hypertension, a fib, hypothyroidism, and osteoarthritis.  At last visit had an elevation Cr, pt reported she did not drinking enough water and was instructed to increase fluid intake. Which pt has done.  Also reports she has read up on statins and wonder if they are the cause of her muscle pains. Reports ongoing muscle aches and pains and thought this may be related to the statin.   Review of Systems:  Review of Systems  Constitutional: Negative for fever, activity change, appetite change and unexpected weight change.  HENT: Negative for congestion.   Eyes: Negative.   Respiratory: Negative for cough, shortness of breath (on exertion ) and wheezing.   Cardiovascular: Negative for chest pain, palpitations (rare) and leg swelling.  Gastrointestinal: Negative for constipation.  Endocrine: Negative for cold intolerance.  Musculoskeletal: Positive for myalgias.  Skin: Negative for color change, pallor, rash and wound.  Neurological: Negative for dizziness, weakness and light-headedness.    Past Medical History  Diagnosis Date  . Shortness of breath     WITH EXERTION  . Fatigue   . Hyperlipidemia   . Hypertension   . Obesity   . MVP (mitral valve prolapse)   . OA (osteoarthritis)   . Plantar fasciitis   . Osteoporosis, senile 08/15/2004  . Vitamin D deficiency   . H/O total hysterectomy   . Aortic valve sclerosis   . Overweight and obesity(278.0) 11/11/2012  . Thyroid  disease   . Closed fracture of shaft of femur 11.14/2012  . Nontoxic uninodular goiter 08/06/2010  . Unspecified hypothyroidism 08/06/2010  . Myalgia and myositis, unspecified 08/06/2010  . Nonspecific abnormal results of pulmonary system function study 08/06/2010  . Other abnormal blood chemistry 06/25/2010  . Enthesopathy of unspecified site 03/19/2009  . Pain in joint, ankle and foot 12/28/2008  . Abnormality of gait 10/09/2008  . Unspecified asthma, with exacerbation 12/02/2005  . Unspecified essential hypertension 08/15/2004  . Atrial fibrillation, persistent    Past Surgical History  Procedure Laterality Date  . Skin cancer excision  2003    NOSE  . Tonsillectomy    . Foot fracture  1976  . Basal cell carcinoma excision  2006    Above left side of lip  . Abdominal hysterectomy  2001    Total Dr. Rexford Maus  . Fracture surgery  1976    Foot  . Fracture surgery Left 2012    hip ORIF  . Colonoscopy  09/27/2008    Dr. Sammuel Cooper normal   Social History:   reports that she has never smoked. She has never used smokeless tobacco. She reports that she drinks alcohol. She reports that she does not use illicit drugs.  Family History  Problem Relation Age of Onset  . Stroke Mother 25    FOLLOWING A BROKEN HIP  . Stroke Father   . Aneurysm Father     Abdominal aortic, repaired    Medications: Patient's Medications  New Prescriptions   No medications on file  Previous Medications   ATORVASTATIN (LIPITOR) 20 MG TABLET    TAKE ONE TABLET BY MOUTH ONCE DAILY TO TREAT HYPERLIPIDEMIA.   CALCIUM CARBONATE (OS-CAL) 600 MG TABS TABLET    Take 600 mg by mouth.    CHOLECALCIFEROL (VITAMIN D-3) 1000 UNITS CAPS    Take 1 capsule by mouth daily.    HYDROCHLOROTHIAZIDE (HYDRODIURIL) 25 MG TABLET    TAKE ONE TABLET BY MOUTH EVERY DAY TO  REDUCE  BLOOD  PRESSURE   LEVOTHYROXINE (SYNTHROID, LEVOTHROID) 50 MCG TABLET    TAKE ONE TABLET BY MOUTH ONCE DAILY   METOPROLOL TARTRATE (LOPRESSOR) 25 MG  TABLET    TAKE ONE TABLET BY MOUTH TWICE DAILY   MULTIPLE VITAMIN (MULTIVITAMIN) TABLET    Take 1 tablet by mouth daily. Take 1 tablet once a day.   OMEGA-3 FATTY ACIDS (FISH OIL) 1000 MG CAPS    Take 1 capsule by mouth daily. Take 1 capsule once a day.   RIVAROXABAN (XARELTO) 20 MG TABS TABLET    Take 1 tablet (20 mg total) by mouth daily with supper.  Modified Medications   No medications on file  Discontinued Medications   No medications on file     Physical Exam: Filed Vitals:   11/23/14 1332  BP: 126/72  Pulse: 68  Temp: 97.4 F (36.3 C)  TempSrc: Oral  Weight: 192 lb (87.091 kg)  SpO2: 99%    Physical Exam  Constitutional: She is oriented to person, place, and time. She appears well-developed and well-nourished. No distress.  HENT:  Head: Normocephalic and atraumatic.  Neck: Normal range of motion. Neck supple.  Cardiovascular: Normal rate.  An irregular rhythm present.  Pulmonary/Chest: Effort normal and breath sounds normal.  Abdominal: Soft. Bowel sounds are normal.  Musculoskeletal: Normal range of motion. She exhibits edema (to right foot, chronic). She exhibits no tenderness.  Neurological: She is alert and oriented to person, place, and time.  Skin: Skin is warm and dry. She is not diaphoretic.  Psychiatric: She has a normal mood and affect.    Labs reviewed: Basic Metabolic Panel:  Recent Labs  06/30/14 10/18/14 11/15/14  NA 140 146 141  K 4.1 4.1 4.2  BUN 20 26* 19  CREATININE 0.9 1.3* 1.0   Liver Function Tests:  Recent Labs  12/09/13 06/30/14  AST 23 18  ALT 19 15  ALKPHOS 46 45   No results for input(s): LIPASE, AMYLASE in the last 8760 hours. No results for input(s): AMMONIA in the last 8760 hours. CBC:  Recent Labs  12/09/13 06/30/14 10/18/14  WBC 7.3 5.5 6.7  HGB 14.0 14.2 14.0  HCT 41 42 44  PLT 217 260 207   TSH:  Recent Labs  12/09/13 06/30/14 09/06/14  TSH 4.58 6.21* 2.40   A1C: No results found for: HGBA1C Lipid  Panel:  Recent Labs  12/09/13  CHOL 182  HDL 84*  LDLCALC 92  TRIG 68     Assessment/Plan 1. Essential hypertension -increased water intake, Cr, BUN, and Sodium back to baseline, will cont HCTZ at this time  2. Hyperglycemia -elevated glucose on BMP, discussed cutting back on sugars, and carbohydrates.encouraged heart healthy diet, increase in activity as well.  3. HYPERCHOLESTEROLEMIA Pt to stop statin to see if this is the cause of her myalgias, last LDL at 92 which is up from previous year but motivated to change diet.  Will have cholesterol rechecked in 3 months while in  [place].   To follow up when she returns from [place]

## 2015-01-12 ENCOUNTER — Ambulatory Visit (INDEPENDENT_AMBULATORY_CARE_PROVIDER_SITE_OTHER): Payer: Medicare Other | Admitting: Cardiology

## 2015-01-12 ENCOUNTER — Encounter: Payer: Self-pay | Admitting: Cardiology

## 2015-01-12 VITALS — BP 149/92 | HR 84 | Ht 61.5 in | Wt 187.9 lb

## 2015-01-12 DIAGNOSIS — E7800 Pure hypercholesterolemia, unspecified: Secondary | ICD-10-CM

## 2015-01-12 DIAGNOSIS — I10 Essential (primary) hypertension: Secondary | ICD-10-CM

## 2015-01-12 DIAGNOSIS — I481 Persistent atrial fibrillation: Secondary | ICD-10-CM

## 2015-01-12 DIAGNOSIS — I4819 Other persistent atrial fibrillation: Secondary | ICD-10-CM

## 2015-01-12 NOTE — Progress Notes (Signed)
Cheyenne Cruz Date of Birth: 02/28/1929 Medical Record #[id number]  History of Present Illness: Cheyenne Cruz is seen for follow up of atrial fibrillation. She was seen in 2011 with symptoms of dyspnea. She had an Echo at that time that showed Normal LV function, AV sclerosis, and mild MR. A stress test showed very poor exercise tolerance. She had a cardiac cath that showed no CAD and normal right heart pressures. In 2015 she was found to be in atrial fibrillation.  She was asymptomatic.   Started on metoprolol and Xarelto. She is tolerating this well. No history of CVA or TIA. No bleeding. Today she states she is still asymptomatic. She does note some dyspnea when walking up hill but this has been a chronic complaint. She goes to exercise class 3 days a week. She did stop taking lipitor because she was concerned about side effects but she doesn't really feel any different. She and her husband are getting ready to leave for [place] for the summer.     Medication List       This list is accurate as of: 01/12/15 12:10 PM.  Always use your most recent med list.               calcium carbonate 600 MG Tabs tablet  Commonly known as:  OS-CAL  Take 600 mg by mouth.     Fish Oil 1000 MG Caps  Take 1 capsule by mouth daily. Take 1 capsule once a day.     hydrochlorothiazide 25 MG tablet  Commonly known as:  HYDRODIURIL  TAKE ONE TABLET BY MOUTH EVERY DAY TO  REDUCE  BLOOD  PRESSURE     levothyroxine 50 MCG tablet  Commonly known as:  SYNTHROID, LEVOTHROID  TAKE ONE TABLET BY MOUTH ONCE DAILY     metoprolol tartrate 25 MG tablet  Commonly known as:  LOPRESSOR  TAKE ONE TABLET BY MOUTH TWICE DAILY     multivitamin tablet  Take 1 tablet by mouth daily. Take 1 tablet once a day.     rivaroxaban 20 MG Tabs tablet  Commonly known as:  XARELTO  Take 1 tablet (20 mg total) by mouth daily with supper.     Vitamin D-3 1000 UNITS Caps  Take 1 capsule by mouth daily.        No Known  Allergies  Past Medical History  Diagnosis Date  . Shortness of breath     WITH EXERTION  . Fatigue   . Hyperlipidemia   . Hypertension   . Obesity   . MVP (mitral valve prolapse)   . OA (osteoarthritis)   . Plantar fasciitis   . Osteoporosis, senile 08/15/2004  . Vitamin D deficiency   . H/O total hysterectomy   . Aortic valve sclerosis   . Overweight and obesity(278.0) 11/11/2012  . Thyroid disease   . Closed fracture of shaft of femur 11.14/2012  . Nontoxic uninodular goiter 08/06/2010  . Unspecified hypothyroidism 08/06/2010  . Myalgia and myositis, unspecified 08/06/2010  . Nonspecific abnormal results of pulmonary system function study 08/06/2010  . Other abnormal blood chemistry 06/25/2010  . Enthesopathy of unspecified site 03/19/2009  . Pain in joint, ankle and foot 12/28/2008  . Abnormality of gait 10/09/2008  . Unspecified asthma, with exacerbation 12/02/2005  . Unspecified essential hypertension 08/15/2004  . Atrial fibrillation, persistent     Past Surgical History  Procedure Laterality Date  . Skin cancer excision  2003    NOSE  . Tonsillectomy    .  Foot fracture  1976  . Basal cell carcinoma excision  2006    Above left side of lip  . Abdominal hysterectomy  2001    Total Dr. Rexford Maus  . Fracture surgery  1976    Foot  . Fracture surgery Left 2012    hip ORIF  . Colonoscopy  09/27/2008    Dr. Sammuel Cooper normal    History   Social History  . Marital Status: Married    Spouse Name: N/A  . Number of Children: N/A  . Years of Education: N/A   Occupational History  . RETIRED     ELEMENTARY SCHOOL TEACHER   Social History Main Topics  . Smoking status: Never Smoker   . Smokeless tobacco: Never Used  . Alcohol Use: 0.0 oz/week    3-4 Glasses of wine per week     Comment: OCCASIONAL GLASS OF WINE  . Drug Use: No  . Sexual Activity: Not on file   Other Topics Concern  . None   Social History Narrative   Married 1954, [name].    Retired  Pharmacist, hospital.    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 Spends summers in [place].    No smoking history    drinks minimal amount of alcohol.   Exercise predominantly weight training, walking dog    Family History  Problem Relation Age of Onset  . Stroke Mother 74    FOLLOWING A BROKEN HIP  . Stroke Father   . Aneurysm Father     Abdominal aortic, repaired    Review of Systems: As noted in HPI.  All other systems were reviewed and are negative.  Physical Exam: BP 149/92 mmHg  Pulse 84  Ht 5' 1.5" (1.562 m)  Wt 85.231 kg (187 lb 14.4 oz)  BMI 34.93 kg/m2 Filed Weights   01/12/15 1115  Weight: 85.231 kg (187 lb 14.4 oz)  GENERAL:  Well appearing obese WF in NAD. HEENT:  PERRL, EOMI, sclera are clear. Oropharynx is clear. NECK:  No jugular venous distention, carotid upstroke brisk and symmetric, no bruits, no thyromegaly or adenopathy LUNGS:  Clear to auscultation bilaterally CHEST:  Unremarkable HEART:  IRRR,  PMI not displaced or sustained,S1 and S2 within normal limits, no S3, no S4: no clicks, no rubs, no murmurs ABD:  Soft, nontender. BS +, no masses or bruits. No hepatomegaly, no splenomegaly EXT:  2 + pulses throughout, no edema, no cyanosis no clubbing. Right ankle chronically enlarged from old injury. SKIN:  Warm and dry.  No rashes NEURO:  Alert and oriented x 3. Cranial nerves II through XII intact. PSYCH:  Cognitively intact   LABORATORY DATA: Lab Results  Component Value Date   WBC 6.7 10/18/2014   HGB 14.0 10/18/2014   HCT 44 10/18/2014   PLT 207 10/18/2014   CHOL 182 12/09/2013   TRIG 68 12/09/2013   HDL 84* 12/09/2013   LDLCALC 92 12/09/2013   ALT 15 06/30/2014   AST 18 06/30/2014   NA 141 11/15/2014   K 4.2 11/15/2014   CREATININE 1.0 11/15/2014   BUN 19 11/15/2014   TSH 2.40 09/06/2014    Assessment / Plan: 1. Atrial fibrillation chronic.  Rate currently well controlled on metoprolol. anticoagulated with  Xarelto with elevated CHAD-vasc score of 4. Since she remains asymptomatic. 2. HTN elevated today but generally well controlled. Will monitor at home. 3. Chronic dyspnea related to obesity and deconditioning. Normal right and left heart cath in 2011. 4. Hypothyroidism.  On replacement.   I will follow up in 6 months.

## 2015-01-12 NOTE — Patient Instructions (Signed)
Continue your current therapy  I will see you in 6 months.   

## 2015-02-12 ENCOUNTER — Other Ambulatory Visit: Payer: Self-pay | Admitting: Nurse Practitioner

## 2015-02-27 ENCOUNTER — Other Ambulatory Visit: Payer: Self-pay | Admitting: *Deleted

## 2015-02-27 DIAGNOSIS — I10 Essential (primary) hypertension: Secondary | ICD-10-CM

## 2015-02-27 MED ORDER — HYDROCHLOROTHIAZIDE 25 MG PO TABS: ORAL_TABLET | ORAL | Status: DC

## 2015-02-27 NOTE — Telephone Encounter (Signed)
Patient requested refill. Patient is in [place] on vacation.

## 2015-03-06 ENCOUNTER — Other Ambulatory Visit: Payer: Self-pay | Admitting: *Deleted

## 2015-03-06 DIAGNOSIS — I10 Essential (primary) hypertension: Secondary | ICD-10-CM

## 2015-03-06 MED ORDER — HYDROCHLOROTHIAZIDE 25 MG PO TABS
ORAL_TABLET | ORAL | Status: DC
Start: 2015-03-06 — End: 2015-06-07

## 2015-03-06 NOTE — Telephone Encounter (Signed)
[facility] 

## 2015-03-08 DIAGNOSIS — R0600 Dyspnea, unspecified: Secondary | ICD-10-CM | POA: Diagnosis not present

## 2015-03-08 DIAGNOSIS — R062 Wheezing: Secondary | ICD-10-CM | POA: Diagnosis not present

## 2015-03-14 DIAGNOSIS — I482 Chronic atrial fibrillation: Secondary | ICD-10-CM | POA: Diagnosis not present

## 2015-03-14 DIAGNOSIS — R635 Abnormal weight gain: Secondary | ICD-10-CM | POA: Diagnosis not present

## 2015-03-14 DIAGNOSIS — R609 Edema, unspecified: Secondary | ICD-10-CM | POA: Diagnosis not present

## 2015-03-14 DIAGNOSIS — I5043 Acute on chronic combined systolic (congestive) and diastolic (congestive) heart failure: Secondary | ICD-10-CM | POA: Diagnosis not present

## 2015-03-14 DIAGNOSIS — I509 Heart failure, unspecified: Secondary | ICD-10-CM | POA: Diagnosis not present

## 2015-03-14 DIAGNOSIS — I4891 Unspecified atrial fibrillation: Secondary | ICD-10-CM | POA: Diagnosis not present

## 2015-03-20 DIAGNOSIS — I5043 Acute on chronic combined systolic (congestive) and diastolic (congestive) heart failure: Secondary | ICD-10-CM | POA: Diagnosis not present

## 2015-03-28 DIAGNOSIS — I10 Essential (primary) hypertension: Secondary | ICD-10-CM | POA: Diagnosis not present

## 2015-03-28 DIAGNOSIS — I509 Heart failure, unspecified: Secondary | ICD-10-CM | POA: Diagnosis not present

## 2015-04-04 DIAGNOSIS — E039 Hypothyroidism, unspecified: Secondary | ICD-10-CM | POA: Diagnosis not present

## 2015-04-04 DIAGNOSIS — R600 Localized edema: Secondary | ICD-10-CM | POA: Diagnosis not present

## 2015-04-04 DIAGNOSIS — R609 Edema, unspecified: Secondary | ICD-10-CM | POA: Diagnosis not present

## 2015-04-04 DIAGNOSIS — I482 Chronic atrial fibrillation: Secondary | ICD-10-CM | POA: Diagnosis not present

## 2015-04-04 DIAGNOSIS — I10 Essential (primary) hypertension: Secondary | ICD-10-CM | POA: Diagnosis not present

## 2015-04-04 DIAGNOSIS — I4891 Unspecified atrial fibrillation: Secondary | ICD-10-CM | POA: Diagnosis not present

## 2015-04-18 DIAGNOSIS — I482 Chronic atrial fibrillation: Secondary | ICD-10-CM | POA: Diagnosis not present

## 2015-04-18 DIAGNOSIS — I509 Heart failure, unspecified: Secondary | ICD-10-CM | POA: Diagnosis not present

## 2015-04-18 DIAGNOSIS — R609 Edema, unspecified: Secondary | ICD-10-CM | POA: Diagnosis not present

## 2015-04-18 DIAGNOSIS — I5043 Acute on chronic combined systolic (congestive) and diastolic (congestive) heart failure: Secondary | ICD-10-CM | POA: Diagnosis not present

## 2015-06-07 ENCOUNTER — Non-Acute Institutional Stay: Payer: Medicare Other | Admitting: Internal Medicine

## 2015-06-07 ENCOUNTER — Encounter: Payer: Self-pay | Admitting: Internal Medicine

## 2015-06-07 VITALS — BP 120/82 | HR 60 | Temp 97.8°F | Wt 190.0 lb

## 2015-06-07 DIAGNOSIS — I4891 Unspecified atrial fibrillation: Secondary | ICD-10-CM

## 2015-06-07 DIAGNOSIS — I5032 Chronic diastolic (congestive) heart failure: Secondary | ICD-10-CM

## 2015-06-07 DIAGNOSIS — R5383 Other fatigue: Secondary | ICD-10-CM

## 2015-06-07 DIAGNOSIS — E7800 Pure hypercholesterolemia, unspecified: Secondary | ICD-10-CM | POA: Diagnosis not present

## 2015-06-07 DIAGNOSIS — I481 Persistent atrial fibrillation: Secondary | ICD-10-CM

## 2015-06-07 DIAGNOSIS — R739 Hyperglycemia, unspecified: Secondary | ICD-10-CM | POA: Diagnosis not present

## 2015-06-07 DIAGNOSIS — E039 Hypothyroidism, unspecified: Secondary | ICD-10-CM

## 2015-06-07 DIAGNOSIS — M810 Age-related osteoporosis without current pathological fracture: Secondary | ICD-10-CM | POA: Diagnosis not present

## 2015-06-07 DIAGNOSIS — I4819 Other persistent atrial fibrillation: Secondary | ICD-10-CM

## 2015-06-07 MED ORDER — FUROSEMIDE 20 MG PO TABS: 20.0000 mg | ORAL_TABLET | Freq: Two times a day (BID) | ORAL | Status: DC | PRN

## 2015-06-07 MED ORDER — LISINOPRIL 5 MG PO TABS: 5.0000 mg | ORAL_TABLET | Freq: Every day | ORAL | Status: DC

## 2015-06-07 MED ORDER — LEVOTHYROXINE SODIUM 50 MCG PO TABS: 50.0000 ug | ORAL_TABLET | Freq: Every day | ORAL | Status: DC

## 2015-06-07 MED ORDER — METOPROLOL TARTRATE 25 MG PO TABS: 25.0000 mg | ORAL_TABLET | Freq: Two times a day (BID) | ORAL | Status: DC

## 2015-06-07 MED ORDER — ATORVASTATIN CALCIUM 20 MG PO TABS: ORAL_TABLET | ORAL | Status: DC

## 2015-06-07 MED ORDER — RIVAROXABAN 20 MG PO TABS: 20.0000 mg | ORAL_TABLET | Freq: Every day | ORAL | Status: DC

## 2015-06-07 NOTE — Progress Notes (Signed)
Patient ID: Cheyenne Cruz, female   DOB: May 05, 1929, 82 y.o.   MRN: [id number]   Location: [facility] Provider: Rexene Edison. Mariea Clonts, D.O., C.M.D.  Code Status: DNR Goals of Care: Advanced Directive information Does patient have an advance directive?: Yes, Type of Advance Directive: [place];Out of facility DNR (pink MOST or yellow form), Pre-existing out of facility DNR order (yellow form or pink MOST form): Yellow form placed in chart (order not valid for inpatient use)  Chief Complaint  Patient presents with  . Medical Management of Chronic Issues    wants to go over with Dr. Mariea Clonts about the breathing problems she had while in [place], her medications, the tireness that comes with A-Fib    HPI: Patient is a 81 y.o. female seen in the office today for  Afib was diagnosed last spring.  Had prior heart palpitations in the past at various times in her life.    Did see Dr. Martinique at that time.  Was told she needs to lose weight.   Breathing problems at night when lying down.  When walking on incline she gets very sob.  Had some weight gain.  Does exercise tiw with the group at [facility].  Does not enjoy exercise.  Does ok walking on flat surface.  Prior left femur fx and right ankle injury.  Notes increased fatigue even just after eating breakfast.  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  Now hardly can get through the day w/o taking a nap.  Admits she's 86.    Says she takes more pills than she'd like.  Many look alike.  Lisinopril.  Furosemide.  Says she must really look at them so she does not mix anything up.  Also Metoprolol.  Levothyroxine, too.    Review of Systems:  Review of Systems  Constitutional: Positive for malaise/fatigue. Negative for fever and chills.  HENT: Negative for congestion.   Eyes: Negative for blurred vision.  Respiratory: Positive for shortness of breath. Negative for cough and wheezing.   Cardiovascular:  Positive for leg swelling. Negative for chest pain and palpitations.       Right foot swells more than left (prior ankle tendon injury)  Gastrointestinal: Negative for abdominal pain and constipation.  Genitourinary: Negative for dysuria, urgency and frequency.  Musculoskeletal: Negative for falls.  Skin: Negative for rash.  Neurological: Negative for dizziness, loss of consciousness and headaches.  Endo/Heme/Allergies: Bruises/bleeds easily.  Psychiatric/Behavioral: Negative for depression and memory loss.    Past Medical History  Diagnosis Date  . Shortness of breath     WITH EXERTION  . Fatigue   . Hyperlipidemia   . Hypertension   . Obesity   . MVP (mitral valve prolapse)   . OA (osteoarthritis)   . Plantar fasciitis   . Osteoporosis, senile 08/15/2004  . Vitamin D deficiency   . H/O total hysterectomy   . Aortic valve sclerosis   . Overweight and obesity(278.0) 11/11/2012  . Thyroid disease   . Closed fracture of shaft of femur ([place]) 11.14/2012  . Nontoxic uninodular goiter 08/06/2010  . Unspecified hypothyroidism 08/06/2010  . Myalgia and myositis, unspecified 08/06/2010  . Nonspecific abnormal results of pulmonary system function study 08/06/2010  . Other abnormal blood chemistry 06/25/2010  . Enthesopathy of unspecified site 03/19/2009  . Pain in joint, ankle and foot 12/28/2008  . Abnormality of gait 10/09/2008  . Unspecified asthma, with exacerbation 12/02/2005  . Unspecified essential hypertension 08/15/2004  .  Atrial fibrillation, persistent [facility])     Past Surgical History  Procedure Laterality Date  . Skin cancer excision  2003    NOSE  . Tonsillectomy    . Foot fracture  1976  . Basal cell carcinoma excision  2006    Above left side of lip  . Abdominal hysterectomy  2001    Total Dr. Rexford Maus  . Fracture surgery  1976    Foot  . Fracture surgery Left 2012    hip ORIF  . Colonoscopy  09/27/2008    Dr. Sammuel Cooper normal    No Known Allergies      Medication List       This list is accurate as of: 06/07/15  2:18 PM.  Always use your most recent med list.               atorvastatin 20 MG tablet  Commonly known as:  LIPITOR  TAKE ONE TABLET BY MOUTH ONCE DAILY TO TREAT  HYPERLIPIDEMIA.     calcium carbonate 600 MG Tabs tablet  Commonly known as:  OS-CAL  Take 600 mg by mouth.     Fish Oil 1000 MG Caps  Take 1 capsule by mouth daily. Take 1 capsule once a day.     furosemide 20 MG tablet  Commonly known as:  LASIX  Take one twice daily if weight gain or swelling increases     hydrochlorothiazide 25 MG tablet  Commonly known as:  HYDRODIURIL  Take one tablet by mouth once daily for blood pressure     levothyroxine 50 MCG tablet  Commonly known as:  SYNTHROID, LEVOTHROID  TAKE ONE TABLET BY MOUTH ONCE DAILY     lisinopril 5 MG tablet  Commonly known as:  PRINIVIL,ZESTRIL  Take one daily     metoprolol tartrate 25 MG tablet  Commonly known as:  LOPRESSOR  TAKE ONE TABLET BY MOUTH TWICE DAILY     multivitamin tablet  Take 1 tablet by mouth daily. Take 1 tablet once a day.     rivaroxaban 20 MG Tabs tablet  Commonly known as:  XARELTO  Take 1 tablet (20 mg total) by mouth daily with supper.     Vitamin D-3 1000 UNITS Caps  Take 1 capsule by mouth daily.        Health Maintenance  Topic Date Due  . PNA vac Low Risk Adult (2 of 2 - PCV13) 05/19/2009  . TETANUS/TDAP  10/17/2012  . INFLUENZA VACCINE  03/20/2015  . DEXA SCAN  Completed  . ZOSTAVAX  Completed    Physical Exam: Filed Vitals:   06/07/15 1345  BP: 120/82  Pulse: 60  Temp: 97.8 F (36.6 C)  TempSrc: Oral  Weight: 190 lb (86.183 kg)  SpO2: 98%   Body mass index is 35.32 kg/(m^2). Physical Exam  Constitutional: She is oriented to person, place, and time. She appears well-developed and well-nourished. No distress.  Neck: Neck supple. No JVD present.  Cardiovascular: Intact distal pulses.   irreg irreg  Pulmonary/Chest: Effort normal  and breath sounds normal. No respiratory distress. She has no rales.  Abdominal: Soft. Bowel sounds are normal. She exhibits no distension. There is no tenderness.  Musculoskeletal: Normal range of motion.  Some crepitus on standing in knees  Neurological: She is alert and oriented to person, place, and time.  Skin: Skin is warm and dry.  Psychiatric: She has a normal mood and affect.    Labs reviewed: Basic Metabolic Panel:  Recent Labs  06/30/14 09/06/14 10/18/14 11/15/14  NA 140  --  146 141  K 4.1  --  4.1 4.2  BUN 20  --  26* 19  CREATININE 0.9  --  1.3* 1.0  TSH 6.21* 2.40  --   --    Liver Function Tests:  Recent Labs  06/30/14  AST 18  ALT 15  ALKPHOS 45   No results for input(s): LIPASE, AMYLASE in the last 8760 hours. No results for input(s): AMMONIA in the last 8760 hours. CBC:  Recent Labs  06/30/14 10/18/14  WBC 5.5 6.7  HGB 14.2 14.0  HCT 42 44  PLT 260 207   Lipid Panel: No results for input(s): CHOL, HDL, LDLCALC, TRIG, CHOLHDL, LDLDIRECT in the last 8760 hours. No results found for: HGBA1C  Procedures since last visit: Echo reviewed.   Notes from NP in [place] reviewed--these were copied and will be scanned.    Assessment/Plan 1. Chronic diastolic heart failure (HCC) -cont lasix 20mg  bid as needed for   2. Persistent atrial fibrillation (HCC) -cont xarelto, metoprolol -f/u with Dr. Martinique as planned  3. Senile osteoporosis -cont ca and vitamin D -encouraged exercise  4. Hypothyroidism, unspecified hypothyroidism type -cont current synthroid -last tsh in August normal  5. HYPERCHOLESTEROLEMIA -cont lipitor 20mg  with evening meal and cutting back on sweets -increase exercise--discussing swimming as an option  6. Hyperglycemia -f/u hba1c which has historically run high -recommended cutting back on sweets   7.  Fatigue: -multifactorial:  Deconditioning. Afib, metoprolol, lasix, 86 years -f/u full lab panel as below -last tsh only in  august was normal in [place]     Labs/tests ordered:  Cbc, cmp, flp, hba1c, TSH Next appt:  About 3 mos for annual wellness exam  Cheyenne Cruz L. Arthurine Oleary, D.O. [facility] [address], [place] [address] Cell Phone (Mon-Fri 8am-5pm):  [phone] On Call:  [phone] & follow prompts after 5pm & weekends Office Phone:  [phone] Office Fax:  [phone]

## 2015-06-08 DIAGNOSIS — Z23 Encounter for immunization: Secondary | ICD-10-CM | POA: Diagnosis not present

## 2015-06-13 DIAGNOSIS — H524 Presbyopia: Secondary | ICD-10-CM | POA: Diagnosis not present

## 2015-06-13 DIAGNOSIS — E785 Hyperlipidemia, unspecified: Secondary | ICD-10-CM | POA: Diagnosis not present

## 2015-06-13 DIAGNOSIS — I10 Essential (primary) hypertension: Secondary | ICD-10-CM | POA: Diagnosis not present

## 2015-06-13 DIAGNOSIS — H5211 Myopia, right eye: Secondary | ICD-10-CM | POA: Diagnosis not present

## 2015-06-13 DIAGNOSIS — R739 Hyperglycemia, unspecified: Secondary | ICD-10-CM | POA: Diagnosis not present

## 2015-06-13 DIAGNOSIS — E079 Disorder of thyroid, unspecified: Secondary | ICD-10-CM | POA: Diagnosis not present

## 2015-06-13 DIAGNOSIS — H2513 Age-related nuclear cataract, bilateral: Secondary | ICD-10-CM | POA: Diagnosis not present

## 2015-06-13 LAB — LIPID PANEL
CHOLESTEROL: 154 mg/dL (ref 0–200)
HDL: 71 mg/dL — AB (ref 35–70)
LDL Cholesterol: 75 mg/dL
LDl/HDL Ratio: 1.1
Triglycerides: 67 mg/dL (ref 40–160)

## 2015-06-13 LAB — HEPATIC FUNCTION PANEL
ALK PHOS: 49 U/L (ref 25–125)
ALT: 15 U/L (ref 7–35)
AST: 19 U/L (ref 13–35)
Bilirubin, Total: 0.9 mg/dL

## 2015-06-13 LAB — CBC AND DIFFERENTIAL
HCT: 43 % (ref 36–46)
HEMOGLOBIN: 13.9 g/dL (ref 12.0–16.0)
Platelets: 236 10*3/uL (ref 150–399)
WBC: 12.7 10*3/mL

## 2015-06-13 LAB — BASIC METABOLIC PANEL
BUN: 18 mg/dL (ref 4–21)
Creatinine: 1.1 mg/dL (ref 0.5–1.1)
GLUCOSE: 117 mg/dL
Potassium: 4.5 mmol/L (ref 3.4–5.3)
SODIUM: 138 mmol/L (ref 137–147)

## 2015-06-13 LAB — HEMOGLOBIN A1C: HEMOGLOBIN A1C: 6.1 % — AB (ref 4.0–6.0)

## 2015-06-13 LAB — TSH: TSH: 2.25 u[IU]/mL (ref 0.41–5.90)

## 2015-06-14 ENCOUNTER — Telehealth: Payer: Self-pay

## 2015-06-14 NOTE — Telephone Encounter (Signed)
Left message on voice mail, call back for lab results.

## 2015-06-15 NOTE — Telephone Encounter (Signed)
Spoke with patient about labs. Dr. Mariea Clonts wants to know if she has any signs or symptoms of infection, yes she has a cold, she told the lab girl, but was told it was ok. Told patient glucose 117, A1c 6.1, needs to saw her sweets, she knows and will try to do better. Results results were good, TSH normal. Will keep appt 10/11/15 with Dr. Mariea Clonts for physical. No change in anything at this time.

## 2015-06-29 DIAGNOSIS — H25012 Cortical age-related cataract, left eye: Secondary | ICD-10-CM | POA: Diagnosis not present

## 2015-06-29 DIAGNOSIS — H25812 Combined forms of age-related cataract, left eye: Secondary | ICD-10-CM | POA: Diagnosis not present

## 2015-06-29 DIAGNOSIS — H2512 Age-related nuclear cataract, left eye: Secondary | ICD-10-CM | POA: Diagnosis not present

## 2015-07-07 ENCOUNTER — Encounter: Payer: Self-pay | Admitting: Internal Medicine

## 2015-07-12 ENCOUNTER — Ambulatory Visit
Admission: RE | Admit: 2015-07-12 | Discharge: 2015-07-12 | Disposition: A | Discharge status: Home / Self Care | Payer: Medicare Other | Source: Ambulatory Visit | Attending: Cardiology | Admitting: Cardiology

## 2015-07-12 ENCOUNTER — Encounter: Payer: Self-pay | Admitting: Cardiology

## 2015-07-12 ENCOUNTER — Ambulatory Visit (INDEPENDENT_AMBULATORY_CARE_PROVIDER_SITE_OTHER): Payer: Medicare Other | Admitting: Cardiology

## 2015-07-12 VITALS — BP 134/94 | HR 72 | Ht 61.0 in | Wt 187.3 lb

## 2015-07-12 DIAGNOSIS — E7800 Pure hypercholesterolemia, unspecified: Secondary | ICD-10-CM | POA: Diagnosis not present

## 2015-07-12 DIAGNOSIS — I482 Chronic atrial fibrillation, unspecified: Secondary | ICD-10-CM

## 2015-07-12 DIAGNOSIS — I10 Essential (primary) hypertension: Secondary | ICD-10-CM

## 2015-07-12 DIAGNOSIS — R0602 Shortness of breath: Secondary | ICD-10-CM | POA: Diagnosis not present

## 2015-07-12 DIAGNOSIS — R0600 Dyspnea, unspecified: Secondary | ICD-10-CM

## 2015-07-12 NOTE — Progress Notes (Signed)
Cheyenne Cruz Date of Birth: May 11, 1929 Medical Record [id number]  History of Present Illness: Cheyenne Cruz is seen for follow up of atrial fibrillation. She was seen in 2011 with symptoms of dyspnea. She had an Echo at that time that showed Normal LV function, AV sclerosis, and mild MR. A stress test showed very poor exercise tolerance. She had a cardiac cath that showed no CAD and normal right heart pressures.She had pulmonary evaluation at that time with Dr. Gwenette Greet with normal PFTs.  In 2015 she was found to be in atrial fibrillation.  She was asymptomatic.   Started on metoprolol and Xarelto. She is tolerating this well. No history of CVA or TIA. No bleeding.   This summer while in [place] she noted increased symptoms of dyspnea associated with orthopnea and PND. She was started on lasix with improvement in PND and orthopnea but she has persistent dyspnea. Some swelling in right ankle that improved with lasix. Still has difficulty walking any distance or participating in exercise program due to dyspnea. She also notes she is more tired during the day. No cough. No chest pain.    Medication List       This list is accurate as of: 07/12/15  6:18 PM.  Always use your most recent med list.               atorvastatin 20 MG tablet  Commonly known as:  LIPITOR  TAKE ONE TABLET BY MOUTH ONCE DAILY TO TREAT  HYPERLIPIDEMIA.     calcium carbonate 600 MG Tabs tablet  Commonly known as:  OS-CAL  Take 600 mg by mouth.     Fish Oil 1000 MG Caps  Take 1 capsule by mouth daily. Take 1 capsule once a day.     furosemide 20 MG tablet  Commonly known as:  LASIX  Take 1 tablet (20 mg total) by mouth 2 (two) times daily as needed for edema (weight gain).     levothyroxine 50 MCG tablet  Commonly known as:  SYNTHROID, LEVOTHROID  Take 1 tablet (50 mcg total) by mouth daily.     lisinopril 5 MG tablet  Commonly known as:  PRINIVIL,ZESTRIL  Take 1 tablet (5 mg total) by mouth daily.     metoprolol tartrate 25 MG tablet  Commonly known as:  LOPRESSOR  Take 1 tablet (25 mg total) by mouth 2 (two) times daily.     multivitamin tablet  Take 1 tablet by mouth daily. Take 1 tablet once a day.     rivaroxaban 20 MG Tabs tablet  Commonly known as:  XARELTO  Take 1 tablet (20 mg total) by mouth daily with supper.     Vitamin D-3 1000 UNITS Caps  Take 1 capsule by mouth daily.        No Known Allergies  Past Medical History  Diagnosis Date  . Shortness of breath     WITH EXERTION  . Fatigue   . Hyperlipidemia   . Hypertension   . Obesity   . MVP (mitral valve prolapse)   . OA (osteoarthritis)   . Plantar fasciitis   . Osteoporosis, senile 08/15/2004  . Vitamin D deficiency   . H/O total hysterectomy   . Aortic valve sclerosis   . Overweight and obesity(278.0) 11/11/2012  . Thyroid disease   . Closed fracture of shaft of femur ([place]) 11.14/2012  . Nontoxic uninodular goiter 08/06/2010  . Unspecified hypothyroidism 08/06/2010  . Myalgia and myositis, unspecified 08/06/2010  . Nonspecific  abnormal results of pulmonary system function study 08/06/2010  . Other abnormal blood chemistry 06/25/2010  . Enthesopathy of unspecified site 03/19/2009  . Pain in joint, ankle and foot 12/28/2008  . Abnormality of gait 10/09/2008  . Unspecified asthma, with exacerbation 12/02/2005  . Unspecified essential hypertension 08/15/2004  . Atrial fibrillation, persistent [facility])     Past Surgical History  Procedure Laterality Date  . Skin cancer excision  2003    NOSE  . Tonsillectomy    . Foot fracture  1976  . Basal cell carcinoma excision  2006    Above left side of lip  . Abdominal hysterectomy  2001    Total Dr. Rexford Maus  . Fracture surgery  1976    Foot  . Fracture surgery Left 2012    hip ORIF  . Colonoscopy  09/27/2008    Dr. Sammuel Cooper normal    Social History   Social History  . Marital Status: Married    Spouse Name: N/A  . Number of Children: N/A  . Years  of Education: N/A   Occupational History  . RETIRED     ELEMENTARY SCHOOL TEACHER   Social History Main Topics  . Smoking status: Never Smoker   . Smokeless tobacco: Never Used  . Alcohol Use: 0.0 oz/week    3-4 Glasses of wine per week     Comment: OCCASIONAL GLASS OF WINE  . Drug Use: No  . Sexual Activity: Not Asked   Other Topics Concern  . None   Social History Narrative   Married 1954, Cheyenne Cruz.    Retired Pharmacist, hospital.    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 Spends summers in [place].    No smoking history    drinks minimal amount of alcohol.   Exercise predominantly weight training, walking dog    Family History  Problem Relation Age of Onset  . Stroke Mother 11    FOLLOWING A BROKEN HIP  . Stroke Father   . Aneurysm Father     Abdominal aortic, repaired    Review of Systems: As noted in HPI.  All other systems were reviewed and are negative.  Physical Exam: BP 134/94 mmHg  Pulse 72  Ht 5\' 1"  (1.549 m)  Wt 84.964 kg (187 lb 5 oz)  BMI 35.41 kg/m2 Filed Weights   07/12/15 1030  Weight: 84.964 kg (187 lb 5 oz)  GENERAL:  Well appearing obese WF in NAD. HEENT:  PERRL, EOMI, sclera are clear. Oropharynx is clear. NECK:  No jugular venous distention, carotid upstroke brisk and symmetric, no bruits, no thyromegaly or adenopathy LUNGS:  Clear to auscultation bilaterally CHEST:  Unremarkable HEART:  IRRR,  PMI not displaced or sustained,S1 and S2 within normal limits, no S3, no S4: no clicks, no rubs, no murmurs ABD:  Soft, nontender. BS +, no masses or bruits. No hepatomegaly, no splenomegaly EXT:  2 + pulses throughout, no edema, no cyanosis no clubbing. Right ankle chronically enlarged from old injury. SKIN:  Warm and dry.  No rashes NEURO:  Alert and oriented x 3. Cranial nerves II through XII intact. PSYCH:  Cognitively intact   LABORATORY DATA: Lab Results  Component Value Date   WBC 12.7 06/13/2015   HGB  13.9 06/13/2015   HCT 43 06/13/2015   PLT 236 06/13/2015   CHOL 154 06/13/2015   TRIG 67 06/13/2015   HDL 71* 06/13/2015   LDLCALC 75 06/13/2015   ALT 15 06/13/2015   AST 19 06/13/2015  NA 138 06/13/2015   K 4.5 06/13/2015   CREATININE 1.1 06/13/2015   BUN 18 06/13/2015   TSH 2.25 06/13/2015   HGBA1C 6.1* 06/13/2015   CLINICAL DATA: Shortness of Breath  EXAM: CHEST 2 VIEW  COMPARISON: 12/2/ 11  FINDINGS: Borderline cardiomegaly. No acute infiltrate or pleural effusion. No pulmonary edema. Calcified granuloma left upper lobe again noted. Old fracture of the right seventh rib. Osteopenia and degenerative changes thoracic spine.  IMPRESSION: No active cardiopulmonary disease.   Electronically Signed  By: Lahoma Crocker M.D.  On: 07/12/2015 15:15      Result Notes     Notes Recorded by Peter M Martinique, MD on 07/12/2015 at 5:34 PM CXR looks good.  Peter Martinique MD, [facility]            Vitals     Height Weight BMI (Calculated)    5\' 1"  (1.549 m) 84.964 kg (187 lb 5 oz) 35.5      Interpretation Summary     CLINICAL DATA: Shortness of Breath  EXAM: CHEST 2 VIEW  COMPARISON: 12/2/ 11  FINDINGS: Borderline cardiomegaly. No acute infiltrate or pleural effusion. No pulmonary edema. Calcified granuloma left upper lobe again noted. Old fracture of the right seventh rib. Osteopenia and degenerative changes thoracic spine.  IMPRESSION: No active cardiopulmonary disease.   Electronically Signed  By: Lahoma Crocker M.D.    Assessment / Plan: 1. Atrial fibrillation chronic.  Rate currently well controlled on metoprolol. Anticoagulated with Xarelto with elevated CHAD-vasc score of 4.   2. HTN   3. Acute on Chronic dyspnea. I think her more recent exacerbation is related to diastolic CHF with her Afib. Recommend increasing lasix to 40 mg daily. There is a component related to obesity and deconditioning. Normal right and left heart  cath in 2011. Given obesity will arrange over nigh oximetry to screen for sleep apnea. Recommend sodium restriction. Will have her follow up in 3 weeks with BMET and BNP.   4. Hypothyroidism. On replacement.

## 2015-07-12 NOTE — Patient Instructions (Signed)
Increase lasix to 40 mg daily  We will arrange for overnight sleep evaluation  Restrict your salt intake.  We will check a chest Xray  Follow up in 3 weeks with lab work

## 2015-07-20 ENCOUNTER — Telehealth: Payer: Self-pay

## 2015-07-20 ENCOUNTER — Other Ambulatory Visit: Payer: Self-pay

## 2015-07-20 NOTE — Telephone Encounter (Signed)
Order for Home Overnight Sleep Evaluation faxed to [facility] at fax # [phone].

## 2015-08-02 DIAGNOSIS — R0600 Dyspnea, unspecified: Secondary | ICD-10-CM | POA: Diagnosis not present

## 2015-08-02 DIAGNOSIS — I10 Essential (primary) hypertension: Secondary | ICD-10-CM | POA: Diagnosis not present

## 2015-08-02 DIAGNOSIS — I4891 Unspecified atrial fibrillation: Secondary | ICD-10-CM | POA: Diagnosis not present

## 2015-08-02 DIAGNOSIS — I482 Chronic atrial fibrillation: Secondary | ICD-10-CM | POA: Diagnosis not present

## 2015-08-02 DIAGNOSIS — E7800 Pure hypercholesterolemia, unspecified: Secondary | ICD-10-CM | POA: Diagnosis not present

## 2015-08-03 ENCOUNTER — Telehealth: Payer: Self-pay | Admitting: Cardiology

## 2015-08-03 DIAGNOSIS — H25811 Combined forms of age-related cataract, right eye: Secondary | ICD-10-CM | POA: Diagnosis not present

## 2015-08-03 DIAGNOSIS — H2511 Age-related nuclear cataract, right eye: Secondary | ICD-10-CM | POA: Diagnosis not present

## 2015-08-03 LAB — BASIC METABOLIC PANEL
BUN: 23 mg/dL (ref 7–25)
CALCIUM: 9.3 mg/dL (ref 8.6–10.4)
CO2: 27 mmol/L (ref 20–31)
CREATININE: 0.97 mg/dL — AB (ref 0.60–0.88)
Chloride: 103 mmol/L (ref 98–110)
GLUCOSE: 165 mg/dL — AB (ref 65–99)
Potassium: 4.1 mmol/L (ref 3.5–5.3)
SODIUM: 140 mmol/L (ref 135–146)

## 2015-08-03 LAB — BRAIN NATRIURETIC PEPTIDE: BRAIN NATRIURETIC PEPTIDE: 268.2 pg/mL — AB (ref 0.0–100.0)

## 2015-08-03 NOTE — Telephone Encounter (Signed)
Cheyenne Cruz is returning a call for Cheyenne Cruz .Marland Kitchen Please call   Thanks

## 2015-08-04 DIAGNOSIS — L918 Other hypertrophic disorders of the skin: Secondary | ICD-10-CM | POA: Diagnosis not present

## 2015-08-04 DIAGNOSIS — L814 Other melanin hyperpigmentation: Secondary | ICD-10-CM | POA: Diagnosis not present

## 2015-08-04 DIAGNOSIS — D1801 Hemangioma of skin and subcutaneous tissue: Secondary | ICD-10-CM | POA: Diagnosis not present

## 2015-08-04 DIAGNOSIS — L821 Other seborrheic keratosis: Secondary | ICD-10-CM | POA: Diagnosis not present

## 2015-08-04 DIAGNOSIS — Z85828 Personal history of other malignant neoplasm of skin: Secondary | ICD-10-CM | POA: Diagnosis not present

## 2015-08-04 DIAGNOSIS — L720 Epidermal cyst: Secondary | ICD-10-CM | POA: Diagnosis not present

## 2015-08-04 NOTE — Telephone Encounter (Signed)
Informed her husband.he states patient is at the doctors office.  Malachy Mood will be back in the office on Monday 08/07/15 Husband verbalized understanding.

## 2015-08-07 ENCOUNTER — Ambulatory Visit (INDEPENDENT_AMBULATORY_CARE_PROVIDER_SITE_OTHER): Payer: Medicare Other | Admitting: Physician Assistant

## 2015-08-07 ENCOUNTER — Encounter: Payer: Self-pay | Admitting: Physician Assistant

## 2015-08-07 VITALS — BP 128/70 | HR 79 | Ht 61.0 in | Wt 187.0 lb

## 2015-08-07 DIAGNOSIS — I482 Chronic atrial fibrillation, unspecified: Secondary | ICD-10-CM

## 2015-08-07 DIAGNOSIS — Z7901 Long term (current) use of anticoagulants: Secondary | ICD-10-CM

## 2015-08-07 DIAGNOSIS — I5032 Chronic diastolic (congestive) heart failure: Secondary | ICD-10-CM | POA: Diagnosis not present

## 2015-08-07 MED ORDER — FUROSEMIDE 20 MG PO TABS: ORAL_TABLET | ORAL | Status: DC

## 2015-08-07 NOTE — Telephone Encounter (Signed)
Returned call to patient no answer.LMTC. 

## 2015-08-07 NOTE — Progress Notes (Signed)
Cardiology Office Note   Date:  08/07/2015   ID:  Cheyenne Cruz, DOB 03-19-1929, MRN [id number]  PCP:  Hollace Kinnier, DO  Cardiologist:  Dr Martinique  Izea Livolsi, PA-C    Chief complaint : atrial fibrillation and dyspnea on exertion  History of Present Illness: Cheyenne Cruz is a 81 y.o. female with a history of PAF on metoprolol and Xarelto, nl EF w/ AoV sclerosis & mild MR, cath w/ no CAD and nl R heart pressures, nl PFTs, poor exercise tolerance.   Cheyenne Cruz presents for follow-up of her atrial fibrillation and dyspnea on exertion.    Cheyenne Cruz is still getting dyspnea on exertion. She and her husband are able to walk around the block which she does but she gets short of breath whenever she tries to increase her activity level above the certain point. This is not changed recently. She has occasional right lower extremity edema which is chronic since she had an injury to that leg in the remote past. She generally does not get edema on the left. She denies orthopnea or PND. She weighs herself daily and has had no recent weight changes. She feels that she eats a reasonable diet and does not add salt to foods. She does not get chest pain. She rarely gets palpitations. She dislikes exercising and so does not go to exercise classes , but there is one she might like. She does walk the dog at least once a day.   She feels her symptoms started back when she had atrial fibrillation,  But also feels that her level of exertion is appropriate for her age.   Past Medical History  Diagnosis Date  . Shortness of breath     WITH EXERTION  . Fatigue   . Hyperlipidemia   . Hypertension   . Obesity   . MVP (mitral valve prolapse)   . OA (osteoarthritis)   . Plantar fasciitis   . Osteoporosis, senile 08/15/2004  . Vitamin D deficiency   . H/O total hysterectomy   . Aortic valve sclerosis   . Overweight and obesity(278.0) 11/11/2012  . Thyroid disease   . Closed fracture of  shaft of femur ([place]) 11.14/2012  . Nontoxic uninodular goiter 08/06/2010  . Unspecified hypothyroidism 08/06/2010  . Myalgia and myositis, unspecified 08/06/2010  . Nonspecific abnormal results of pulmonary system function study 08/06/2010  . Other abnormal blood chemistry 06/25/2010  . Enthesopathy of unspecified site 03/19/2009  . Pain in joint, ankle and foot 12/28/2008  . Abnormality of gait 10/09/2008  . Unspecified asthma, with exacerbation 12/02/2005  . Unspecified essential hypertension 08/15/2004  . Atrial fibrillation, persistent [facility])     Past Surgical History  Procedure Laterality Date  . Skin cancer excision  2003    NOSE  . Tonsillectomy    . Foot fracture  1976  . Basal cell carcinoma excision  2006    Above left side of lip  . Abdominal hysterectomy  2001    Total Dr. Rexford Maus  . Fracture surgery  1976    Foot  . Fracture surgery Left 2012    hip ORIF  . Colonoscopy  09/27/2008    Dr. Sammuel Cooper normal    Current Outpatient Prescriptions  Medication Sig Dispense Refill  . atorvastatin (LIPITOR) 20 MG tablet TAKE ONE TABLET BY MOUTH ONCE DAILY TO TREAT  HYPERLIPIDEMIA. 30 tablet 3  . calcium carbonate (OS-CAL) 600 MG TABS tablet Take 600 mg by mouth.     Marland Kitchen  Cholecalciferol (VITAMIN D-3) 1000 UNITS CAPS Take 1 capsule by mouth daily.     . DUREZOL 0.05 % EMUL Place 1 drop into the right eye 4 (four) times daily.     . furosemide (LASIX) 20 MG tablet Take 2 tablets once daily and one tablet in the evening as needed 75 tablet 6  . levothyroxine (SYNTHROID, LEVOTHROID) 50 MCG tablet Take 1 tablet (50 mcg total) by mouth daily. 30 tablet 3  . lisinopril (PRINIVIL,ZESTRIL) 5 MG tablet Take 1 tablet (5 mg total) by mouth daily. 30 tablet 3  . metoprolol tartrate (LOPRESSOR) 25 MG tablet Take 1 tablet (25 mg total) by mouth 2 (two) times daily. 60 tablet 3  . Multiple Vitamin (MULTIVITAMIN) tablet Take 1 tablet by mouth daily. Take 1 tablet once a day.    . Omega-3 Fatty  Acids (FISH OIL) 1000 MG CAPS Take 1 capsule by mouth daily. Take 1 capsule once a day.    . rivaroxaban (XARELTO) 20 MG TABS tablet Take 1 tablet (20 mg total) by mouth daily with supper. 30 tablet 3  . VIGAMOX 0.5 % ophthalmic solution Place 1 drop into the right eye 4 (four) times daily.      No current facility-administered medications for this visit.    Allergies:   Review of patient's allergies indicates no known allergies.    Social History:  The patient  reports that she has never smoked. She has never used smokeless tobacco. She reports that she drinks alcohol. She reports that she does not use illicit drugs.   Family History:  The patient's family history includes Aneurysm in her father; Stroke in her father; Stroke (age of onset: 47) in her mother.    ROS:  Please see the history of present illness. All other systems are reviewed and negative.    PHYSICAL EXAM: VS:  BP 128/70 mmHg  Pulse 79  Ht 5\' 1"  (1.549 m)  Wt 187 lb (84.823 kg)  BMI 35.35 kg/m2 , BMI Body mass index is 35.35 kg/(m^2). GEN: Well nourished, well developed, female in no acute distress HEENT: normal for age  Neck: minimal JVD with slight hepatojugular reflux, no carotid bruit, no masses Cardiac: Irregular in rate and rhythm;  2/6 murmur, no rubs, or gallops Respiratory:  clear to auscultation bilaterally, normal work of breathing GI: soft, nontender, nondistended, + BS MS: no deformity or atrophy;  trace edema right, normal left; distal pulses are 2+ in all 4 extremities  Skin: warm and dry, no rash Neuro:  Strength and sensation are intact Psych: euthymic mood, full affect   EKG:  EKG is ordered today. The ekg ordered today demonstrates  Atrial fibrillation, rate 79 , PVCs noted   Recent Labs: 06/13/2015: ALT 15; Hemoglobin 13.9; Platelets 236; TSH 2.25 08/02/2015: BUN 23; Creat 0.97*; Potassium 4.1; Sodium 140    Lipid Panel    Component Value Date/Time   CHOL 154 06/13/2015   TRIG 67  06/13/2015   HDL 71* 06/13/2015   LDLCALC 75 06/13/2015     Wt Readings from Last 3 Encounters:  08/07/15 187 lb (84.823 kg)  07/12/15 187 lb 5 oz (84.964 kg)  06/07/15 190 lb (86.183 kg)     Other studies Reviewed: Additional studies/ records that were reviewed today include:  Hospital records and office notes.  ASSESSMENT AND PLAN:  1.   Atrial fibrillation : her heart rate is controlled. No changes indicated in her current dose of metoprolol.   2.  Chronic diastolic  CHF/Dyspnea on exertion: She is encouraged to increase her activity in whatever way she can. She states that she likes to dance and and would do this with her husband if she got the chance. She is encouraged to walk the dog little bit further more often, she is also encouraged to go to an exercise class or museum for example to get out a little more. She states they are busy and out about every night, so is not sure she has time for anything else.  Sometimes she has some swelling so will increase her Lasix by giving her 20 mg daily when necessary for edema or weight gain. Additionally, she was taking her Lasix to 20 mg tablets every day for a total of 40 mg and we will change her prescription to reflect this. She had lab work performed recently. Her potassium and kidney function were stable and no lab work will be ordered today.   Current medicines are reviewed at length with the patient today.  The patient does not have concerns regarding medicines.  The following changes have been made:    Labs/ tests ordered today include:   Orders Placed This Encounter  Procedures  . EKG 12-Lead     Disposition:   FU with  Dr. Martinique  Signed, Lenoard Aden  08/07/2015 3:19 PM    [address], [place], [place]  [address] Phone: [phone]; Fax: [phone]

## 2015-08-07 NOTE — Patient Instructions (Signed)
Medication Instructions:   FUROSEMIDE 20 MG TAKE 2 TABLETS ONCE DAILY AND THEN ONE TABLET IN THE EVENING AS NEEDED FOR WEIGHT GAIN  Follow-Up:  Your physician recommends that you schedule a follow-up appointment in: February WITH DR Martinique    If you need a refill on your cardiac medications before your next appointment, please call your pharmacy.

## 2015-08-09 NOTE — Telephone Encounter (Signed)
Already taken care of.Patient saw Rosaria Ferries PA 08/07/15.

## 2015-08-20 HISTORY — PX: CATARACT EXTRACTION W/ INTRAOCULAR LENS  IMPLANT, BILATERAL: SHX1307

## 2015-08-25 ENCOUNTER — Telehealth: Payer: Self-pay

## 2015-08-25 NOTE — Telephone Encounter (Signed)
Received over night oximetry from Cheyenne Cruz.Dr.Jordan advised patient does not need O2.Note faxed to [facility] at fax # [phone].

## 2015-08-25 NOTE — Telephone Encounter (Signed)
Error on previous 08/25/15 note.Received over night oximetry from [place].Did not fax note to [facility].

## 2015-09-11 ENCOUNTER — Encounter: Payer: Self-pay | Admitting: Cardiology

## 2015-09-15 ENCOUNTER — Telehealth: Payer: Self-pay | Admitting: *Deleted

## 2015-09-15 NOTE — Telephone Encounter (Signed)
Initiated prior authorization for Xarelto 20mg  daily. Called [phone] and spoke with [place] at [place] and medication was APPROVED through 08/18/2016  Reference #: [id number] Member ID#: [id number] Patient Notified.

## 2015-09-29 ENCOUNTER — Other Ambulatory Visit: Payer: Self-pay

## 2015-09-29 MED ORDER — LEVOTHYROXINE SODIUM 50 MCG PO TABS: 50.0000 ug | ORAL_TABLET | Freq: Every day | ORAL | Status: DC

## 2015-10-11 ENCOUNTER — Non-Acute Institutional Stay: Payer: Medicare Other | Admitting: Internal Medicine

## 2015-10-11 ENCOUNTER — Encounter: Payer: Self-pay | Admitting: Internal Medicine

## 2015-10-11 VITALS — BP 124/72 | HR 66 | Temp 98.0°F | Ht 61.5 in | Wt 189.0 lb

## 2015-10-11 DIAGNOSIS — I10 Essential (primary) hypertension: Secondary | ICD-10-CM | POA: Diagnosis not present

## 2015-10-11 DIAGNOSIS — I4891 Unspecified atrial fibrillation: Secondary | ICD-10-CM

## 2015-10-11 DIAGNOSIS — Z0000 Encounter for general adult medical examination without abnormal findings: Secondary | ICD-10-CM

## 2015-10-11 DIAGNOSIS — M129 Arthropathy, unspecified: Secondary | ICD-10-CM

## 2015-10-11 DIAGNOSIS — R739 Hyperglycemia, unspecified: Secondary | ICD-10-CM | POA: Diagnosis not present

## 2015-10-11 DIAGNOSIS — E039 Hypothyroidism, unspecified: Secondary | ICD-10-CM

## 2015-10-11 DIAGNOSIS — I5032 Chronic diastolic (congestive) heart failure: Secondary | ICD-10-CM

## 2015-10-11 DIAGNOSIS — R5382 Chronic fatigue, unspecified: Secondary | ICD-10-CM | POA: Diagnosis not present

## 2015-10-11 DIAGNOSIS — M19071 Primary osteoarthritis, right ankle and foot: Secondary | ICD-10-CM

## 2015-10-11 MED ORDER — LEVOTHYROXINE SODIUM 50 MCG PO TABS: 50.0000 ug | ORAL_TABLET | Freq: Every day | ORAL | Status: DC

## 2015-10-11 MED ORDER — FUROSEMIDE 20 MG PO TABS: ORAL_TABLET | ORAL | Status: DC

## 2015-10-11 MED ORDER — ATORVASTATIN CALCIUM 20 MG PO TABS: ORAL_TABLET | ORAL | Status: DC

## 2015-10-11 MED ORDER — RIVAROXABAN 20 MG PO TABS: 20.0000 mg | ORAL_TABLET | Freq: Every day | ORAL | Status: DC

## 2015-10-11 MED ORDER — METOPROLOL TARTRATE 25 MG PO TABS: 25.0000 mg | ORAL_TABLET | Freq: Two times a day (BID) | ORAL | Status: DC

## 2015-10-11 MED ORDER — LISINOPRIL 5 MG PO TABS: 5.0000 mg | ORAL_TABLET | Freq: Every day | ORAL | Status: DC

## 2015-10-11 NOTE — Progress Notes (Signed)
Patient ID: Cheyenne Cruz, female   DOB: March 11, 1929, 82 y.o.   MRN: [id number]   Location:  [facility] (12) Provider: Shaindel Sweeten L. Mariea Clonts, D.O., C.M.D.  Patient Care Team: Gayland Curry, DO as PCP - General (Geriatric Medicine) Mardene Celeste, NP as Nurse Practitioner (Gerontology) [facility] Cletis Athens, MD as Consulting Physician (Gastroenterology) Peter M Martinique, MD as Consulting Physician (Cardiology)  Extended Emergency Contact Information Primary Emergency Contact: Lorel Monaco Address: [place], [place] [address] [place] Phone: [phone] Relation: Spouse  Code Status: DNR Goals of Care: Advanced Directive information Advanced Directives 10/11/2015  Does patient have an advance directive? -  Type of Advance Directive Out of facility DNR (pink MOST or yellow form)  Copy of advanced directive(s) in chart? Yes  Pre-existing out of facility DNR order (yellow form or pink MOST form) Yellow form placed in chart (order not valid for inpatient use)   Chief Complaint  Patient presents with  . Annual Exam    Wellness exam  . Medical Management of Chronic Issues    blood pressure, thyroid, A-Fib  . MMSE    30/30 passed clock drawing     HPI: Patient is a 82 y.o. female seen in today for an annual wellness exam .    Runny nose frequently.  Any old time.  Carries a kleenex all the time.    Feeling of fatigue and exhaustion.  Used to have boundless energy.  Tired, taking naps, sits down more often.  Discussed that metoprolol has some sedating effect, but she needs the medication.  TSH normal in October and she already had the symptoms.  Is also breathless going up hills and if she exerts much at all.  The case before her last appt. Had 07/08/14 echo.  Did have some of the fatigue then, but not like now.  Dr. Martinique had a nurse counsel her her about diet,  exercise, weight.  Weight unchanged.  We again discussed extensively the need for diet and exercise due to deconditioning.  Depression screen [facility] 2/9 10/11/2015 10/26/2014 12/22/2013  Decreased Interest 0 0 0  Down, Depressed, Hopeless 0 0 0  PHQ - 2 Score 0 0 0    Fall Risk  10/11/2015 10/26/2014 12/22/2013  Falls in the past year? No No No   MMSE - Mini Mental State Exam 10/11/2015  Orientation to time 5  Orientation to Place 5  Registration 3  Attention/ Calculation 5  Recall 3  Language- name 2 objects 2  Language- repeat 1  Language- follow 3 step command 3  Language- read & follow direction 1  Write a sentence 1  Copy design 1  Total score 30  clock passed  Health Maintenance  Topic Date Due  . PNA vac Low Risk Adult (2 of 2 - PCV13) 05/19/2009  . TETANUS/TDAP  10/17/2012  . INFLUENZA VACCINE  03/19/2016  . DEXA SCAN  Completed  . ZOSTAVAX  Completed   Urinary incontinence?  Goes frequently, runs to get there.  Does wear a pad due to dribble.  Does not wear one at night.  Also has some leakage if coughs or sneezes or if waits too long.    Functional Status Survey: Is the patient deaf or have difficulty hearing?: No Does the patient have difficulty seeing, even when wearing glasses/contacts?: No Does the patient have  difficulty concentrating, remembering, or making decisions?: No Does the patient have difficulty walking or climbing stairs?: No Does the patient have difficulty dressing or bathing?: No Does the patient have difficulty doing errands alone such as visiting a doctor's office or shopping?: No Exercise?  No much.  Does walk around the block very gently--can't stay on the right ankle.  Has not tried the water exercise due to 8am time. Diet?  No special diet, but says she's cut back on glasses of wine--now weekly and has cut out desserts.    Had cataract surgery and vision better than ever.  No difficulty with hearing.  Dentition:  No difficulty with chewing or  swallowing.  Just saw dentist and all was good. Pain:  Right ankle constantly hurts.  It's longstanding.  Does nothing for it, but a rare ibuprofen.    Past Medical History  Diagnosis Date  . Shortness of breath     WITH EXERTION  . Fatigue   . Hyperlipidemia   . Hypertension   . Obesity   . MVP (mitral valve prolapse)   . OA (osteoarthritis)   . Plantar fasciitis   . Osteoporosis, senile 08/15/2004  . Vitamin D deficiency   . H/O total hysterectomy   . Aortic valve sclerosis   . Overweight and obesity(278.0) 11/11/2012  . Thyroid disease   . Closed fracture of shaft of femur ([place]) 11.14/2012  . Nontoxic uninodular goiter 08/06/2010  . Unspecified hypothyroidism 08/06/2010  . Myalgia and myositis, unspecified 08/06/2010  . Nonspecific abnormal results of pulmonary system function study 08/06/2010  . Other abnormal blood chemistry 06/25/2010  . Enthesopathy of unspecified site 03/19/2009  . Pain in joint, ankle and foot 12/28/2008  . Abnormality of gait 10/09/2008  . Unspecified asthma, with exacerbation 12/02/2005  . Unspecified essential hypertension 08/15/2004  . Atrial fibrillation, persistent [facility])     Past Surgical History  Procedure Laterality Date  . Skin cancer excision  2003    NOSE  . Tonsillectomy    . Foot fracture  1976  . Basal cell carcinoma excision  2006    Above left side of lip  . Abdominal hysterectomy  2001    Total Dr. Rexford Maus  . Fracture surgery  1976    Foot  . Fracture surgery Left 2012    hip ORIF  . Colonoscopy  09/27/2008    Dr. Sammuel Cooper normal  . Cataract extraction w/ intraocular lens  implant, bilateral  08/2015    Dr. Maricela Curet    Social History   Social History  . Marital Status: Married    Spouse Name: N/A  . Number of Children: N/A  . Years of Education: N/A   Occupational History  . 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 Smoking status: Never Smoker   . Smokeless tobacco: Never  Used  . Alcohol Use: 0.0 oz/week    3-4 Glasses of wine per week     Comment: OCCASIONAL GLASS OF WINE  . Drug Use: No  . Sexual Activity: Not on file   Other Topics Concern  . Not on file   Social History Narrative   Married 1954, [name].    Retired Pharmacist, hospital.    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 Spends summers in [place].    No smoking history    drinks minimal amount of alcohol.   Exercise predominantly weight training, walking dog   DNR  No Known Allergies    Medication List       This list is accurate as of: 10/11/15  3:38 PM.  Always use your most recent med list.               atorvastatin 20 MG tablet  Commonly known as:  LIPITOR  TAKE ONE TABLET BY MOUTH ONCE DAILY TO TREAT  HYPERLIPIDEMIA.     calcium carbonate 600 MG Tabs tablet  Commonly known as:  OS-CAL  Take 600 mg by mouth. Take one tablet twice daily     Fish Oil 1000 MG Caps  Take 1 capsule by mouth daily. Take 1 capsule once a day.     furosemide 20 MG tablet  Commonly known as:  LASIX  Take 2 tablets once daily and one tablet in the evening as needed     levothyroxine 50 MCG tablet  Commonly known as:  SYNTHROID, LEVOTHROID  Take 1 tablet (50 mcg total) by mouth daily.     lisinopril 5 MG tablet  Commonly known as:  PRINIVIL,ZESTRIL  Take 1 tablet (5 mg total) by mouth daily.     metoprolol tartrate 25 MG tablet  Commonly known as:  LOPRESSOR  Take 1 tablet (25 mg total) by mouth 2 (two) times daily.     multivitamin tablet  Take 1 tablet by mouth daily. Take 1 tablet once a day.     rivaroxaban 20 MG Tabs tablet  Commonly known as:  XARELTO  Take 1 tablet (20 mg total) by mouth daily with supper.     Vitamin D-3 1000 units Caps  Take 1 capsule by mouth daily.       Review of Systems:  Review of Systems  Constitutional: Positive for fatigue. Negative for fever, chills, appetite change and unexpected weight change.  HENT:  Positive for rhinorrhea. Negative for congestion and hearing loss.   Eyes: Negative for visual disturbance.  Respiratory: Negative for shortness of breath.   Cardiovascular: Negative for chest pain, palpitations and leg swelling.  Gastrointestinal: Negative for abdominal pain and constipation.  Endocrine: Negative for cold intolerance and heat intolerance.  Genitourinary: Negative for dysuria.  Musculoskeletal: Positive for arthralgias.  Skin: Negative for color change.  Allergic/Immunologic: Negative for environmental allergies.  Neurological: Negative for dizziness, weakness and light-headedness.  Psychiatric/Behavioral: Negative for confusion. The patient is not nervous/anxious.    Physical Exam: Filed Vitals:   10/11/15 1445  BP: 124/72  Pulse: 66  Temp: 98 F (36.7 C)  TempSrc: Oral  Height: 5' 1.5" (1.562 m)  Weight: 189 lb (85.73 kg)  SpO2: 98%   Body mass index is 35.14 kg/(m^2). Physical Exam  Constitutional: She is oriented to person, place, and time. She appears well-developed and well-nourished. No distress.  HENT:  Head: Normocephalic and atraumatic.  Cardiovascular: Normal rate, regular rhythm, normal heart sounds and intact distal pulses.   Pulmonary/Chest: Effort normal and breath sounds normal. No respiratory distress.  Abdominal: Soft. Bowel sounds are normal. She exhibits no distension.  Musculoskeletal: Normal range of motion.  Right ankle swelling and tenderness, chronic  Neurological: She is alert and oriented to person, place, and time.  Skin: Skin is warm and dry.  Psychiatric: She has a normal mood and affect.    Labs reviewed: Basic Metabolic Panel:  Recent Labs  11/15/14 06/13/15 08/02/15 0920  NA 141 138 140  K 4.2 4.5 4.1  CL  --   --  103  CO2  --   --  27  GLUCOSE  --   --  165*  BUN 19 18 23   CREATININE 1.0 1.1 0.97*  CALCIUM  --   --  9.3  TSH  --  2.25  --    Liver Function Tests:  Recent Labs  06/13/15  AST 19  ALT 15    ALKPHOS 49   No results for input(s): LIPASE, AMYLASE in the last 8760 hours. No results for input(s): AMMONIA in the last 8760 hours. CBC:  Recent Labs  10/18/14 06/13/15  WBC 6.7 12.7  HGB 14.0 13.9  HCT 44 43  PLT 207 236   Lipid Panel:  Recent Labs  06/13/15  CHOL 154  HDL 71*  LDLCALC 75  TRIG 67   Lab Results  Component Value Date   HGBA1C 6.1* 06/13/2015    Assessment/Plan Medicare annual wellness: She is up to date   See hpi  1. Chronic fatigue -suspect primarily deconditioning -will see echo changes at all at next cardiology f/u -also probably worsened with beta blocker--? Could be changed to CCB -encouraged exercise  2. Atrial fibrillation, unspecified -on beta blocker and xarelto - rivaroxaban (XARELTO) 20 MG TABS tablet; Take 1 tablet (20 mg total) by mouth daily with supper.  Dispense: 30 tablet; Refill: 11  3. Chronic diastolic heart failure (HCC) -does not have other clinic signs of this worsening besides her fatigue -keep cardiology f/u  4. Hyperglycemia -cont to monitor hba1c--prediabetic -must lose weight  5. Essential hypertension -bp is well controlled with current regimen--no changes  6. Arthritis of right ankle -this interferes with her ability to walk for exercise -other types of exercise were encouraged -does not take any medication consistently for her pain either  7. Hypothyroidism, unspecified hypothyroidism type -TSH has been therapeutic historically, cont synthroid 37mcg daily  Labs/tests ordered:  TSH before Next appt:  May before goes to [place]. Sonia Bromell, D.O. [facility] [address] [place], [place] [address] Cell Phone (Mon-Fri 8am-5pm):  [phone] On Call:  [phone] & follow prompts after 5pm & weekends Office Phone:  [phone] Office Fax:  [phone]

## 2015-10-11 NOTE — Progress Notes (Signed)
Patient ID: Cheyenne Cruz, female   DOB: 1928/09/12, 82 y.o.   MRN: [id number] MMSE 30/30 passed clock drawing

## 2015-10-19 ENCOUNTER — Encounter: Payer: Self-pay | Admitting: Cardiology

## 2015-10-19 ENCOUNTER — Ambulatory Visit (INDEPENDENT_AMBULATORY_CARE_PROVIDER_SITE_OTHER): Payer: Medicare Other | Admitting: Cardiology

## 2015-10-19 VITALS — BP 126/74 | HR 68 | Ht 62.0 in | Wt 189.0 lb

## 2015-10-19 DIAGNOSIS — I10 Essential (primary) hypertension: Secondary | ICD-10-CM | POA: Diagnosis not present

## 2015-10-19 DIAGNOSIS — I482 Chronic atrial fibrillation, unspecified: Secondary | ICD-10-CM

## 2015-10-19 DIAGNOSIS — E7800 Pure hypercholesterolemia, unspecified: Secondary | ICD-10-CM | POA: Diagnosis not present

## 2015-10-19 NOTE — Patient Instructions (Signed)
Continue your current therapy  I will see you in 6 months.   

## 2015-10-19 NOTE — Progress Notes (Signed)
Gerald Leitz Date of Birth: Sep 03, 1928 Medical Record [id number]  History of Present Illness: Cheyenne Cruz is seen for follow up of atrial fibrillation. She was seen in 2011 with symptoms of dyspnea. She had an Echo at that time that showed Normal LV function, AV sclerosis, and mild MR. A stress test showed very poor exercise tolerance. She had a cardiac cath that showed no CAD and normal right heart pressures.She had pulmonary evaluation at that time with Dr. Gwenette Greet with normal PFTs.  In 2015 she was found to be in atrial fibrillation.  She was asymptomatic.   Started on metoprolol and Xarelto. She later developed findings of diastolic CHF. She was placed on lasix with improvement.   On follow up today she thinks she is slowing down.  Still has difficulty walking any distance or participating in exercise program due to dyspnea. She actually dropped out of exercise program at [facility] because she thought it was too vigorous. She notes fatigue without increased swelling or weight gain. No chest pain or dizziness.     Medication List       This list is accurate as of: 10/19/15  1:30 PM.  Always use your most recent med list.               atorvastatin 20 MG tablet  Commonly known as:  LIPITOR  TAKE ONE TABLET BY MOUTH ONCE DAILY TO TREAT  HYPERLIPIDEMIA.     calcium carbonate 600 MG Tabs tablet  Commonly known as:  OS-CAL  Take 600 mg by mouth. Take one tablet twice daily     Fish Oil 1000 MG Caps  Take 1 capsule by mouth daily. Take 1 capsule once a day.     furosemide 20 MG tablet  Commonly known as:  LASIX  Take 2 tablets once daily and one tablet in the evening as needed     levothyroxine 50 MCG tablet  Commonly known as:  SYNTHROID, LEVOTHROID  Take 1 tablet (50 mcg total) by mouth daily.     lisinopril 5 MG tablet  Commonly known as:  PRINIVIL,ZESTRIL  Take 1 tablet (5 mg total) by mouth daily.     metoprolol tartrate 25 MG tablet  Commonly known as:  LOPRESSOR    Take 1 tablet (25 mg total) by mouth 2 (two) times daily.     multivitamin tablet  Take 1 tablet by mouth daily. Take 1 tablet once a day.     rivaroxaban 20 MG Tabs tablet  Commonly known as:  XARELTO  Take 1 tablet (20 mg total) by mouth daily with supper.     Vitamin D-3 1000 units Caps  Take 1 capsule by mouth daily.        No Known Allergies  Past Medical History  Diagnosis Date  . Shortness of breath     WITH EXERTION  . Fatigue   . Hyperlipidemia   . Hypertension   . Obesity   . MVP (mitral valve prolapse)   . OA (osteoarthritis)   . Plantar fasciitis   . Osteoporosis, senile 08/15/2004  . Vitamin D deficiency   . H/O total hysterectomy   . Aortic valve sclerosis   . Overweight and obesity(278.0) 11/11/2012  . Thyroid disease   . Closed fracture of shaft of femur ([place]) 11.14/2012  . Nontoxic uninodular goiter 08/06/2010  . Unspecified hypothyroidism 08/06/2010  . Myalgia and myositis, unspecified 08/06/2010  . Nonspecific abnormal results of pulmonary system function study 08/06/2010  .  Other abnormal blood chemistry 06/25/2010  . Enthesopathy of unspecified site 03/19/2009  . Pain in joint, ankle and foot 12/28/2008  . Abnormality of gait 10/09/2008  . Unspecified asthma, with exacerbation 12/02/2005  . Unspecified essential hypertension 08/15/2004  . Atrial fibrillation, persistent [facility])     Past Surgical History  Procedure Laterality Date  . Skin cancer excision  2003    NOSE  . Tonsillectomy    . Foot fracture  1976  . Basal cell carcinoma excision  2006    Above left side of lip  . Abdominal hysterectomy  2001    Total Dr. Rexford Maus  . Fracture surgery  1976    Foot  . Fracture surgery Left 2012    hip ORIF  . Colonoscopy  09/27/2008    Dr. Sammuel Cooper normal  . Cataract extraction w/ intraocular lens  implant, bilateral  08/2015    Dr. Maricela Curet    Social History   Social History  . Marital Status: Married    Spouse Name: N/A  . Number of  Children: N/A  . Years of Education: N/A   Occupational History  . 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 Smoking status: Never Smoker   . Smokeless tobacco: Never Used  . Alcohol Use: 0.0 oz/week    3-4 Glasses of wine per week     Comment: OCCASIONAL GLASS OF WINE  . Drug Use: No  . Sexual Activity: Not Asked   Other Topics Concern  . None   Social History Narrative   Married 1954, [name].    Retired Pharmacist, hospital.    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 Spends summers in [place].    No smoking history    drinks minimal amount of alcohol.   Exercise predominantly weight training, walking dog   DNR    Family History  Problem Relation Age of Onset  . Stroke Mother 60    FOLLOWING A BROKEN HIP  . Stroke Father   . Aneurysm Father     Abdominal aortic, repaired    Review of Systems: As noted in HPI.  All other systems were reviewed and are negative.  Physical Exam: BP 126/74 mmHg  Pulse 68  Ht 5\' 2"  (1.575 m)  Wt 85.73 kg (189 lb)  BMI 34.56 kg/m2 Filed Weights   10/19/15 0949  Weight: 85.73 kg (189 lb)  GENERAL:  Well appearing obese WF in NAD. HEENT:  PERRL, EOMI, sclera are clear. Oropharynx is clear. NECK:  No jugular venous distention, carotid upstroke brisk and symmetric, no bruits, no thyromegaly or adenopathy LUNGS:  Clear to auscultation bilaterally CHEST:  Unremarkable HEART:  IRRR,  PMI not displaced or sustained,S1 and S2 within normal limits, no S3, no S4: no clicks, no rubs, no murmurs ABD:  Soft, nontender. BS +, no masses or bruits. No hepatomegaly, no splenomegaly EXT:  2 + pulses throughout, no edema, no cyanosis no clubbing. Right ankle chronically enlarged from old injury. SKIN:  Warm and dry.  No rashes NEURO:  Alert and oriented x 3. Cranial nerves II through XII intact. PSYCH:  Cognitively intact   LABORATORY DATA: Lab Results  Component Value Date    WBC 12.7 06/13/2015   HGB 13.9 06/13/2015   HCT 43 06/13/2015   PLT 236 06/13/2015   GLUCOSE 165* 08/02/2015   CHOL 154 06/13/2015   TRIG 67 06/13/2015   HDL 71* 06/13/2015   LDLCALC  75 06/13/2015   ALT 15 06/13/2015   AST 19 06/13/2015   NA 140 08/02/2015   K 4.1 08/02/2015   CL 103 08/02/2015   CREATININE 0.97* 08/02/2015   BUN 23 08/02/2015   CO2 27 08/02/2015   TSH 2.25 06/13/2015   HGBA1C 6.1* 06/13/2015    Assessment / Plan: 1. Atrial fibrillation chronic.  Rate currently well controlled on metoprolol. Anticoagulated with Xarelto with elevated CHAD-vasc score of 4.   2. HTN controlled.   3.  Chronic diastolic CHF. She appears to be euvolemic.  Will continue lasix  40 mg daily. Much of her dyspnea is  related to obesity and deconditioning. Normal right and left heart cath in 2011. Recommend sodium restriction.   4. Hypothyroidism. On replacement.   I will follow up in 6 months.

## 2015-11-10 ENCOUNTER — Telehealth: Payer: Self-pay | Admitting: *Deleted

## 2015-11-10 DIAGNOSIS — M62838 Other muscle spasm: Secondary | ICD-10-CM

## 2015-11-10 MED ORDER — METAXALONE 800 MG PO TABS: ORAL_TABLET | ORAL | Status: DC

## 2015-11-10 NOTE — Telephone Encounter (Signed)
Skelaxin 800mg  po qhs prn muscle spasms.  She also may use a warm compress or heating pad on nothing higher than medium for 20 min intervals.  She should not drive when taking this and should be careful walking because muscle relaxants can cause unsteadiness.

## 2015-11-10 NOTE — Telephone Encounter (Signed)
Patient is having Muscle Spasms and it bothers her especially at night, she can't rest. Would like to know if there is something she can take for this. Please Advise.

## 2015-11-10 NOTE — Telephone Encounter (Signed)
Patient husband notified and agreed.

## 2015-11-13 MED ORDER — TIZANIDINE HCL 4 MG PO CAPS: 4.0000 mg | ORAL_CAPSULE | Freq: Every evening | ORAL | Status: DC | PRN

## 2015-11-13 NOTE — Addendum Note (Signed)
Addended by: Gayland Curry on: 11/13/2015 07:59 PM   Modules accepted: Orders, Medications

## 2015-11-13 NOTE — Telephone Encounter (Addendum)
Medication is not covered by patient's insurance the preferred drug is Tizanidine tablets please advise.  Patient would like rx sent to [facility] today if possible

## 2015-11-21 NOTE — Telephone Encounter (Signed)
Spoke with patient to make sure that she picked up Tizanidine prescription that was called in. Patient said medication is working well.

## 2015-12-26 ENCOUNTER — Encounter: Payer: Self-pay | Admitting: Internal Medicine

## 2015-12-26 DIAGNOSIS — E039 Hypothyroidism, unspecified: Secondary | ICD-10-CM | POA: Diagnosis not present

## 2015-12-26 LAB — TSH: TSH: 4.56 u[IU]/mL (ref 0.41–5.90)

## 2016-01-03 ENCOUNTER — Encounter: Payer: Self-pay | Admitting: Internal Medicine

## 2016-01-03 ENCOUNTER — Non-Acute Institutional Stay: Payer: Medicare Other | Admitting: Internal Medicine

## 2016-01-03 VITALS — BP 130/70 | HR 64 | Temp 97.9°F | Ht 62.0 in | Wt 187.0 lb

## 2016-01-03 DIAGNOSIS — I10 Essential (primary) hypertension: Secondary | ICD-10-CM | POA: Diagnosis not present

## 2016-01-03 DIAGNOSIS — E039 Hypothyroidism, unspecified: Secondary | ICD-10-CM

## 2016-01-03 DIAGNOSIS — R5382 Chronic fatigue, unspecified: Secondary | ICD-10-CM

## 2016-01-03 DIAGNOSIS — M62838 Other muscle spasm: Secondary | ICD-10-CM | POA: Diagnosis not present

## 2016-01-03 DIAGNOSIS — I5032 Chronic diastolic (congestive) heart failure: Secondary | ICD-10-CM | POA: Diagnosis not present

## 2016-01-03 DIAGNOSIS — Z23 Encounter for immunization: Secondary | ICD-10-CM

## 2016-01-03 DIAGNOSIS — G4700 Insomnia, unspecified: Secondary | ICD-10-CM

## 2016-01-03 DIAGNOSIS — I4891 Unspecified atrial fibrillation: Secondary | ICD-10-CM | POA: Diagnosis not present

## 2016-01-03 NOTE — Progress Notes (Signed)
Location:  Occupational psychologist of Service:  Clinic (12)  Provider: Libia Fazzini L. Mariea Clonts, D.O., C.M.D.  Code Status: DNR Goals of Care:  Advanced Directives 01/03/2016  Does patient have an advance directive? Yes  Type of Paramedic of [place];Out of facility DNR (pink MOST or yellow form)  Copy of advanced directive(s) in chart? Yes  Pre-existing out of facility DNR order (yellow form or pink MOST form) Yellow form placed in chart (order not valid for inpatient use)   Chief Complaint  Patient presents with  . Medical Management of Chronic Issues    49mth follow-up    HPI: Patient is a 82 y.o. female seen today for medical management of chronic diseases.    She is doing quite well except her back spasms.  Xanaflex was not terrible effective, but did help her sleep.  Caused severe dry mouth. She notes it comes on if she sits at the computer for a long time--will get up and walk around or do something else, but both times this happened, she sat there for 3 hrs at a time.  She took some tylenol instead.  That helped just as much.    Does not like blister packs.  Advised to ask pharmacy about that.   Went through meds and does require them all.    Has had some periods of sleeplessness where her brain won't stop going.  Might get up and read.  Thinks if she takes her metoprolol too late, it seems to be responsible if she doesn't take the metoprolol with supper and takes it at 8:30pm.  Past Medical History  Diagnosis Date  . Shortness of breath     WITH EXERTION  . Fatigue   . Hyperlipidemia   . Hypertension   . Obesity   . MVP (mitral valve prolapse)   . OA (osteoarthritis)   . Plantar fasciitis   . Osteoporosis, senile 08/15/2004  . Vitamin D deficiency   . H/O total hysterectomy   . Aortic valve sclerosis   . Overweight and obesity(278.0) 11/11/2012  . Thyroid disease   . Closed fracture of shaft of femur ([place]) 11.14/2012  . Nontoxic  uninodular goiter 08/06/2010  . Unspecified hypothyroidism 08/06/2010  . Myalgia and myositis, unspecified 08/06/2010  . Nonspecific abnormal results of pulmonary system function study 08/06/2010  . Other abnormal blood chemistry 06/25/2010  . Enthesopathy of unspecified site 03/19/2009  . Pain in joint, ankle and foot 12/28/2008  . Abnormality of gait 10/09/2008  . Unspecified asthma, with exacerbation 12/02/2005  . Unspecified essential hypertension 08/15/2004  . Atrial fibrillation, persistent [facility])     Past Surgical History  Procedure Laterality Date  . Skin cancer excision  2003    NOSE  . Tonsillectomy    . Foot fracture  1976  . Basal cell carcinoma excision  2006    Above left side of lip  . Abdominal hysterectomy  2001    Total Dr. Rexford Maus  . Fracture surgery  1976    Foot  . Fracture surgery Left 2012    hip ORIF  . Colonoscopy  09/27/2008    Dr. Sammuel Cooper normal  . Cataract extraction w/ intraocular lens  implant, bilateral  08/2015    Dr. Maricela Curet    No Known Allergies    Medication List       This list is accurate as of: 01/03/16 11:48 AM.  Always use your most recent med list.  atorvastatin 20 MG tablet  Commonly known as:  LIPITOR  TAKE ONE TABLET BY MOUTH ONCE DAILY TO TREAT  HYPERLIPIDEMIA.     calcium carbonate 600 MG Tabs tablet  Commonly known as:  OS-CAL  Take 600 mg by mouth. Take one tablet twice daily     Fish Oil 1000 MG Caps  Take 1 capsule by mouth daily. Take 1 capsule once a day.     furosemide 20 MG tablet  Commonly known as:  LASIX  Take 2 tablets once daily and one tablet in the evening as needed     levothyroxine 50 MCG tablet  Commonly known as:  SYNTHROID, LEVOTHROID  Take 1 tablet (50 mcg total) by mouth daily.     lisinopril 5 MG tablet  Commonly known as:  PRINIVIL,ZESTRIL  Take 1 tablet (5 mg total) by mouth daily.     metoprolol tartrate 25 MG tablet  Commonly known as:  LOPRESSOR  Take 1 tablet (25  mg total) by mouth 2 (two) times daily.     multivitamin tablet  Take 1 tablet by mouth daily. Take 1 tablet once a day.     rivaroxaban 20 MG Tabs tablet  Commonly known as:  XARELTO  Take 1 tablet (20 mg total) by mouth daily with supper.     tiZANidine 4 MG capsule  Commonly known as:  ZANAFLEX  Take 1 capsule (4 mg total) by mouth at bedtime as needed for muscle spasms.     Vitamin D-3 1000 units Caps  Take 1 capsule by mouth daily.        Review of Systems:  Review of Systems  Constitutional: Positive for malaise/fatigue. Negative for fever and chills.  HENT:       Dry mouth  Eyes: Negative for blurred vision.  Respiratory: Negative for shortness of breath.   Cardiovascular: Negative for chest pain.  Gastrointestinal: Negative for abdominal pain and constipation.  Genitourinary: Negative for dysuria.  Musculoskeletal: Positive for myalgias and back pain. Negative for falls.  Skin: Negative for rash.  Neurological: Negative for dizziness, loss of consciousness and weakness.  Psychiatric/Behavioral: Negative for depression and memory loss. The patient has insomnia.     Health Maintenance  Topic Date Due  . PNA vac Low Risk Adult (2 of 2 - PCV13) 05/19/2009  . TETANUS/TDAP  10/17/2012  . INFLUENZA VACCINE  03/19/2016  . DEXA SCAN  Completed  . ZOSTAVAX  Completed   Physical Exam: Filed Vitals:   01/03/16 1140  BP: 130/70  Pulse: 64  Temp: 97.9 F (36.6 C)  TempSrc: Oral  Height: 5\' 2"  (1.575 m)  Weight: 187 lb (84.823 kg)  SpO2: 96%   Body mass index is 34.19 kg/(m^2). Physical Exam  Constitutional: She is oriented to person, place, and time. She appears well-developed and well-nourished. No distress.  Cardiovascular: Normal rate, regular rhythm, normal heart sounds and intact distal pulses.   Pulmonary/Chest: Effort normal and breath sounds normal. No respiratory distress.  Abdominal: Soft. Bowel sounds are normal. She exhibits no distension and no mass.  There is no tenderness.  Musculoskeletal: Normal range of motion. She exhibits tenderness.  Paravertebral muscles thoracic and around to ribs  Neurological: She is alert and oriented to person, place, and time.  Skin: Skin is warm and dry.  Psychiatric: She has a normal mood and affect.    Labs reviewed: Basic Metabolic Panel:  Recent Labs  06/13/15 08/02/15 0920 12/26/15  NA 138 140  --   K  4.5 4.1  --   CL  --  103  --   CO2  --  27  --   GLUCOSE  --  165*  --   BUN 18 23  --   CREATININE 1.1 0.97*  --   CALCIUM  --  9.3  --   TSH 2.25  --  4.56   Liver Function Tests:  Recent Labs  06/13/15  AST 19  ALT 15  ALKPHOS 49   No results for input(s): LIPASE, AMYLASE in the last 8760 hours. No results for input(s): AMMONIA in the last 8760 hours. CBC:  Recent Labs  06/13/15  WBC 12.7  HGB 13.9  HCT 43  PLT 236   Lipid Panel:  Recent Labs  06/13/15  CHOL 154  HDL 71*  LDLCALC 75  TRIG 67   Lab Results  Component Value Date   HGBA1C 6.1* 06/13/2015    Assessment/Plan 1. Muscle spasm -not helped with muscle relaxants so stopped them, removed from list  2. Chronic diastolic heart failure (HCC) -cont ace, beta blocker, lasix and avoid high sodium foods and adding salt to prevent exacerbation -follows with Dr. [place]  3. Atrial fibrillation, unspecified -rate controlled with beta blocker and takes xarelto as blood thinner  4. Chronic fatigue -feels like she should have more energy, knows she's 86 -likely due to deconditioning, not enough exercise and medications - Lab Results  Component Value Date   TSH 4.56 12/26/2015   5. Insomnia -she relates this to metoprolol which is interesting b/c it typically causes some fatigue -changing the time of it has helped her  6. Essential hypertension -bp is well controlled on current regimen above and w/o dizziness--cont same and monitor  7. Hypothyroidism, unspecified hypothyroidism type -last tsh  wnl -cont same synthroid before breakfast each morning and monitor 1-2x per year  8. Need for vaccination with 13-polyvalent pneumococcal conjugate vaccine -prevnar was given  Labs/tests ordered: cbc, bmp, flp, hba1c, tsh Next appt:  05/15/2016 med mgt, labs before  Donnalynn Wheeless L. Janeka Libman, D.O. [facility] [address], [place] [address] Cell Phone (Mon-Fri 8am-5pm):  [phone] On Call:  [phone] & follow prompts after 5pm & weekends Office Phone:  [phone] Office Fax:  [phone]

## 2016-01-31 DIAGNOSIS — I10 Essential (primary) hypertension: Secondary | ICD-10-CM | POA: Diagnosis not present

## 2016-01-31 DIAGNOSIS — R600 Localized edema: Secondary | ICD-10-CM | POA: Diagnosis not present

## 2016-04-29 DIAGNOSIS — I482 Chronic atrial fibrillation: Secondary | ICD-10-CM | POA: Diagnosis not present

## 2016-04-29 DIAGNOSIS — I10 Essential (primary) hypertension: Secondary | ICD-10-CM | POA: Diagnosis not present

## 2016-04-29 DIAGNOSIS — M5430 Sciatica, unspecified side: Secondary | ICD-10-CM | POA: Diagnosis not present

## 2016-05-03 ENCOUNTER — Other Ambulatory Visit: Payer: Self-pay | Admitting: Internal Medicine

## 2016-05-07 ENCOUNTER — Other Ambulatory Visit: Payer: Self-pay | Admitting: Internal Medicine

## 2016-05-07 ENCOUNTER — Ambulatory Visit
Admission: RE | Admit: 2016-05-07 | Discharge: 2016-05-07 | Disposition: A | Discharge status: Home / Self Care | Payer: Medicare Other | Source: Ambulatory Visit | Attending: Internal Medicine | Admitting: Internal Medicine

## 2016-05-07 DIAGNOSIS — M7989 Other specified soft tissue disorders: Secondary | ICD-10-CM | POA: Diagnosis not present

## 2016-05-07 DIAGNOSIS — R739 Hyperglycemia, unspecified: Secondary | ICD-10-CM | POA: Diagnosis not present

## 2016-05-07 DIAGNOSIS — M79671 Pain in right foot: Secondary | ICD-10-CM

## 2016-05-07 DIAGNOSIS — S99921A Unspecified injury of right foot, initial encounter: Secondary | ICD-10-CM

## 2016-05-07 DIAGNOSIS — E7800 Pure hypercholesterolemia, unspecified: Secondary | ICD-10-CM | POA: Diagnosis not present

## 2016-05-07 DIAGNOSIS — I10 Essential (primary) hypertension: Secondary | ICD-10-CM | POA: Diagnosis not present

## 2016-05-07 DIAGNOSIS — E039 Hypothyroidism, unspecified: Secondary | ICD-10-CM | POA: Diagnosis not present

## 2016-05-07 LAB — LIPID PANEL
Cholesterol: 178 mg/dL (ref 0–200)
HDL: 87 mg/dL — AB (ref 35–70)
LDL Cholesterol: 74 mg/dL
Triglycerides: 85 mg/dL (ref 40–160)

## 2016-05-07 LAB — CBC AND DIFFERENTIAL
HCT: 43 % (ref 36–46)
Hemoglobin: 13.1 g/dL (ref 12.0–16.0)
Platelets: 214 10*3/uL (ref 150–399)
WBC: 6.6 10^3/mL

## 2016-05-07 LAB — BASIC METABOLIC PANEL
BUN: 27 mg/dL — AB (ref 4–21)
Creatinine: 1 mg/dL (ref 0.5–1.1)
Glucose: 109 mg/dL
Potassium: 4.3 mmol/L (ref 3.4–5.3)
Sodium: 140 mmol/L (ref 137–147)

## 2016-05-07 LAB — TSH: TSH: 3.63 u[IU]/mL (ref 0.41–5.90)

## 2016-05-07 LAB — HEMOGLOBIN A1C: Hemoglobin A1C: 6

## 2016-05-07 NOTE — Progress Notes (Signed)
[facility] nurse reports that pt dropped something on her right foot and there is swelling and blue discoloration of her toes. She is going to come for an appt in the office Thursday, but needs an xray of her right foot first.

## 2016-05-09 ENCOUNTER — Ambulatory Visit: Payer: Self-pay | Admitting: Internal Medicine

## 2016-05-14 ENCOUNTER — Encounter: Payer: Self-pay | Admitting: Internal Medicine

## 2016-05-15 ENCOUNTER — Non-Acute Institutional Stay: Payer: Medicare Other | Admitting: Internal Medicine

## 2016-05-15 ENCOUNTER — Encounter: Payer: Self-pay | Admitting: Internal Medicine

## 2016-05-15 VITALS — BP 120/80 | HR 81 | Temp 99.0°F | Ht 62.0 in | Wt 191.0 lb

## 2016-05-15 DIAGNOSIS — I77810 Thoracic aortic ectasia: Secondary | ICD-10-CM | POA: Diagnosis not present

## 2016-05-15 DIAGNOSIS — R739 Hyperglycemia, unspecified: Secondary | ICD-10-CM

## 2016-05-15 DIAGNOSIS — I5032 Chronic diastolic (congestive) heart failure: Secondary | ICD-10-CM | POA: Diagnosis not present

## 2016-05-15 DIAGNOSIS — R0789 Other chest pain: Secondary | ICD-10-CM

## 2016-05-15 DIAGNOSIS — E039 Hypothyroidism, unspecified: Secondary | ICD-10-CM

## 2016-05-15 DIAGNOSIS — R5382 Chronic fatigue, unspecified: Secondary | ICD-10-CM | POA: Diagnosis not present

## 2016-05-15 DIAGNOSIS — I10 Essential (primary) hypertension: Secondary | ICD-10-CM

## 2016-05-15 DIAGNOSIS — I4891 Unspecified atrial fibrillation: Secondary | ICD-10-CM | POA: Diagnosis not present

## 2016-05-15 DIAGNOSIS — Z23 Encounter for immunization: Secondary | ICD-10-CM

## 2016-05-15 DIAGNOSIS — S9031XA Contusion of right foot, initial encounter: Secondary | ICD-10-CM | POA: Diagnosis not present

## 2016-05-15 NOTE — Progress Notes (Addendum)
Location:  [facility] clinic Provider: Jullisa Grigoryan L. Mariea Clonts, D.O., C.M.D.  Code Status: DNR Goals of Care:  Advanced Directives 05/15/2016  Does patient have an advance directive? Yes  Type of Paramedic of [address];Out of facility DNR (pink MOST or yellow form)  Copy of advanced directive(s) in chart? Yes  Pre-existing out of facility DNR order (yellow form or pink MOST form) Yellow form placed in chart (order not valid for inpatient use)     Chief Complaint  Patient presents with  . Acute Visit    swollen painful foot   . Medical Management of Chronic Issues    HPI: Patient is a 82 y.o. female seen today for an acute visit for painful, swollen foot and also for her routine visit for med mgt of chronic diseases.  They are back in [place] for the fall and winter season from [place].    She had a cane and it fell on her right foot which became large, purple discoloration of toes.  Less painful.  She says the pain is much better than it was.  She did RICE.  Happened on her way back from [place]--on day 2.  Fortunately, xrays were negative for fracture.  Does take her xarelto so has a large hematoma that remains on the dorsum of her foot.    She is getting a little tightness in the chest in the morning.  Walking much slower than it used to be.  Has been dyspneic ongoing going up hills.  She's not sure if it's indigestion b/c burping helps.  It typically stops after she takes her medication including the lasix.  Then urinates every 2 mins.  She has been taking the lasix 20mg  twice daily due to increased swelling.  She is aware of the chest tightness when she gets up in the mornings and it lasts about 30 mins until she has her meds in her system.  Tiredness has been ongoing.  Central sternal pain.  Sometimes radiates into her back.  If she breathes real deep, she sometimes feels it.  Has dry cough, but related to ace/lisinopril.  No increase in cough.  Sees Dr. Martinique next in November.  She  last had an echo in 11/15 which showed EF 55-60%, mitral valve bowing, mild to moderate regurg, moderate atrial dilatation.  PA peak pressure 42mmHg.  12/16 EKG showed afib with pvcs and HR 79.  St abnormality. LDL is 74.  BMP, hba1c, tsh unremarkable.    Changed pharmacies to [place] was difficult.  Looks like the lasix was filled 9/15.  Is almost out of lisinopril.    Agrees to prevnar.  Past Medical History:  Diagnosis Date  . Abnormality of gait 10/09/2008  . Aortic valve sclerosis   . Atrial fibrillation, persistent ([place])   . Closed fracture of shaft of femur ([place]) 11.14/2012  . Enthesopathy of unspecified site 03/19/2009  . Fatigue   . H/O total hysterectomy   . Hyperlipidemia   . Hypertension   . MVP (mitral valve prolapse)   . Myalgia and myositis, unspecified 08/06/2010  . Nonspecific abnormal results of pulmonary system function study 08/06/2010  . Nontoxic uninodular goiter 08/06/2010  . OA (osteoarthritis)   . Obesity   . Osteoporosis, senile 08/15/2004  . Other abnormal blood chemistry 06/25/2010  . Overweight and obesity(278.0) 11/11/2012  . Pain in joint, ankle and foot 12/28/2008  . Plantar fasciitis   . Shortness of breath    WITH EXERTION  .  Thyroid disease   . Unspecified asthma, with exacerbation 12/02/2005  . Unspecified essential hypertension 08/15/2004  . Unspecified hypothyroidism 08/06/2010  . Vitamin D deficiency     Past Surgical History:  Procedure Laterality Date  . ABDOMINAL HYSTERECTOMY  2001   Total Dr. Rexford Maus  . BASAL CELL CARCINOMA EXCISION  2006   Above left side of lip  . CATARACT EXTRACTION W/ INTRAOCULAR LENS  IMPLANT, BILATERAL  08/2015   Dr. Maricela Curet  . COLONOSCOPY  09/27/2008   Dr. Sammuel Cooper normal  . [place]  . [place]  . FRACTURE SURGERY Left 2012   hip ORIF  . SKIN CANCER EXCISION  2003   NOSE  . TONSILLECTOMY      No Known Allergies    Medication List       Accurate  as of 05/15/16  9:46 AM. Always use your most recent med list.          atorvastatin 20 MG tablet Commonly known as:  LIPITOR TAKE ONE TABLET BY MOUTH ONCE DAILY TO TREAT  HYPERLIPIDEMIA.   calcium carbonate 600 MG Tabs tablet Commonly known as:  OS-CAL Take 600 mg by mouth. Take one tablet twice daily   Fish Oil 1000 MG Caps Take 1 capsule by mouth daily. Take 1 capsule once a day.   furosemide 20 MG tablet Commonly known as:  LASIX TAKE 2 TABLETS BY MOUTH DAILY AND 1 TABLET AS NEEDED IN THE EVENING   levothyroxine 50 MCG tablet Commonly known as:  SYNTHROID, LEVOTHROID Take 1 tablet (50 mcg total) by mouth daily.   lisinopril 5 MG tablet Commonly known as:  PRINIVIL,ZESTRIL Take 1 tablet (5 mg total) by mouth daily.   metoprolol tartrate 25 MG tablet Commonly known as:  LOPRESSOR Take 1 tablet (25 mg total) by mouth 2 (two) times daily.   multivitamin tablet Take 1 tablet by mouth daily. Take 1 tablet once a day.   rivaroxaban 20 MG Tabs tablet Commonly known as:  XARELTO Take 1 tablet (20 mg total) by mouth daily with supper.   Vitamin D-3 1000 units Caps Take 1 capsule by mouth daily.       Review of Systems:  Review of Systems  Constitutional: Positive for malaise/fatigue. Negative for chills and fever.  HENT: Negative for congestion.   Eyes: Negative for blurred vision.  Respiratory: Positive for cough and shortness of breath. Negative for hemoptysis, sputum production and wheezing.   Cardiovascular: Positive for chest pain and leg swelling. Negative for palpitations, orthopnea and PND.  Gastrointestinal: Positive for heartburn. Negative for abdominal pain, blood in stool, constipation, diarrhea, melena, nausea and vomiting.  Genitourinary: Positive for frequency. Negative for dysuria.  Musculoskeletal: Negative for falls.       Right foot injury  Skin: Negative for itching and rash.  Neurological: Negative for dizziness, loss of consciousness and  weakness.  Endo/Heme/Allergies: Bruises/bleeds easily.  Psychiatric/Behavioral: Negative for memory loss.    Health Maintenance  Topic Date Due  . PNA vac Low Risk Adult (2 of 2 - PCV13) 05/19/2009  . TETANUS/TDAP  10/17/2012  . INFLUENZA VACCINE  03/19/2016  . DEXA SCAN  Completed  . ZOSTAVAX  Completed    Physical Exam: Vitals:   05/15/16 0902  BP: 120/80  Pulse: 81  Temp: 99 F (37.2 C)  TempSrc: Oral  SpO2: 97%  Weight: 191 lb (86.6 kg)  Height: 5\' 2"  (1.575 m)   Body mass index is  34.93 kg/m. Physical Exam  Constitutional: She is oriented to person, place, and time. She appears well-developed and well-nourished. No distress.  Cardiovascular:  irreg irreg, midsystolic click  Pulmonary/Chest: Effort normal and breath sounds normal. She has no wheezes. She has no rales.  Abdominal: Soft. Bowel sounds are normal.  Musculoskeletal: Normal range of motion.  Right foot dorsum tender to touch, hematoma present, ecchymoses remain at base of toes, erythema over dorsum  Neurological: She is alert and oriented to person, place, and time.  Skin: Skin is warm and dry. Capillary refill takes less than 2 seconds.  Psychiatric: She has a normal mood and affect.    Labs reviewed: Basic Metabolic Panel:  Recent Labs  06/13/15 08/02/15 0920 12/26/15 05/07/16 0700  NA 138 140  --  140  K 4.5 4.1  --  4.3  CL  --  103  --   --   CO2  --  27  --   --   GLUCOSE  --  165*  --   --   BUN 18 23  --  27*  CREATININE 1.1 0.97*  --  1.0  CALCIUM  --  9.3  --   --   TSH 2.25  --  4.56 3.63   Liver Function Tests:  Recent Labs  06/13/15  AST 19  ALT 15  ALKPHOS 49   No results for input(s): LIPASE, AMYLASE in the last 8760 hours. No results for input(s): AMMONIA in the last 8760 hours. CBC:  Recent Labs  06/13/15 05/07/16 0700  WBC 12.7 6.6  HGB 13.9 13.1  HCT 43 43  PLT 236 214   Lipid Panel:  Recent Labs  06/13/15 05/07/16 0700  CHOL 154 178  HDL 71* 87*    LDLCALC 75 74  TRIG 67 85   Lab Results  Component Value Date   HGBA1C 6.0 05/07/2016    Procedures since last visit: Dg Foot Complete Right  Result Date: 05/07/2016 CLINICAL DATA:  Injury. EXAM: RIGHT FOOT COMPLETE - 3+ VIEW COMPARISON:  No recent. FINDINGS: No acute bony or joint abnormality identified. No evidence of fracture or dislocation. Diffuse mild degenerative change. Prominent soft tissue swelling noted over the dorsum of the foot on lateral view. IMPRESSION: Prominent soft tissue swelling noted over the dorsum of the foot. No acute bony abnormality. Diffuse mild degenerative change. Electronically Signed   By: Marcello Moores  Register   On: 05/07/2016 15:57    Assessment/Plan 1. Chest tightness -new problem the past several weeks in the mornings -seems to improve with taking her medications -EKG ordered, but had to be done by mobilex as paper was not available for [facility] clinic EKG machine -waiting for EKG to be done now  2. Chronic diastolic heart failure (HCC) -has been taking 2 lasix tabs per day recently due to increased edema and interestingly feels better after that  3. Atrial fibrillation, unspecified -cont xarelto and lopressor--rate seems to be controlled with apical and radial pulse  4. Contusion of right foot, initial encounter -she had her cane fall on her foot and still has a hematoma present  -pain, swelling and ecchymoses have dramatically improved since the event 2 wks ago  5. Chronic fatigue -has been ongoing and felt to be due to deconditioning -previous echo was fairly unremarkable  6. Essential hypertension -bp at goal with current regimen, cont lopressor, lisinopril and 2 furosemide 20mg  per day  7. Hypothyroidism, unspecified hypothyroidism type -tsh at goal, cont same levothyroxine 34mcg daily  8. Hyperglycemia -hba1c was wnl--was checked after elevated fasting glucose on labs  9. Need for vaccination with 13-polyvalent pneumococcal conjugate  vaccine -prevnar given  Labs/tests ordered:  mobile[facility] EKG Next appt:  To be determined based on EKG findings when return  Addendum:  EKG reveals "junctional rhythm at 92bpm"; no acute ischemia or infarct vs. Prior EKG; revealed CT chest she had in 2011 for concerns of aneurysm and it showed dilation of thoracic aorta.  She has no had further CTs her in [place] so will check CT chest with contrast to evaluate for aortic aneurysm now.    Cheyenne Cruz L. Owens Hara, D.O. [facility] [address], [place] [address] Cell Phone (Mon-Fri 8am-5pm):  [phone] On Call:  [phone] & follow prompts after 5pm & weekends Office Phone:  [phone] Office Fax:  [phone]

## 2016-05-15 NOTE — Addendum Note (Signed)
Addended by: Gayland Curry on: 05/15/2016 12:32 PM   Modules accepted: Orders

## 2016-05-15 NOTE — Addendum Note (Signed)
Addended by: Despina Hidden on: 05/15/2016 10:44 AM   Modules accepted: Orders

## 2016-05-23 ENCOUNTER — Other Ambulatory Visit: Payer: Self-pay | Admitting: Internal Medicine

## 2016-05-23 ENCOUNTER — Ambulatory Visit
Admission: RE | Admit: 2016-05-23 | Discharge: 2016-05-23 | Disposition: A | Discharge status: Home / Self Care | Payer: Medicare Other | Source: Ambulatory Visit | Attending: Internal Medicine | Admitting: Internal Medicine

## 2016-05-23 DIAGNOSIS — I77810 Thoracic aortic ectasia: Secondary | ICD-10-CM

## 2016-05-23 DIAGNOSIS — I4891 Unspecified atrial fibrillation: Secondary | ICD-10-CM

## 2016-05-23 DIAGNOSIS — I5032 Chronic diastolic (congestive) heart failure: Secondary | ICD-10-CM

## 2016-05-23 DIAGNOSIS — R918 Other nonspecific abnormal finding of lung field: Secondary | ICD-10-CM | POA: Diagnosis not present

## 2016-05-23 DIAGNOSIS — R0789 Other chest pain: Secondary | ICD-10-CM

## 2016-05-23 DIAGNOSIS — R5382 Chronic fatigue, unspecified: Secondary | ICD-10-CM

## 2016-05-23 MED ORDER — METOPROLOL TARTRATE 25 MG PO TABS: 25.0000 mg | ORAL_TABLET | Freq: Two times a day (BID) | ORAL | 1 refills | Status: DC

## 2016-05-23 MED ORDER — LEVOTHYROXINE SODIUM 50 MCG PO TABS: 50.0000 ug | ORAL_TABLET | Freq: Every day | ORAL | 1 refills | Status: DC

## 2016-05-23 MED ORDER — IOPAMIDOL (ISOVUE-300) INJECTION 61%
75.0000 mL | Freq: Once | INTRAVENOUS | Status: AC | PRN
  Administered 2016-05-23: 75 mL via INTRAVENOUS

## 2016-05-23 MED ORDER — RIVAROXABAN 20 MG PO TABS: 20.0000 mg | ORAL_TABLET | Freq: Every day | ORAL | 1 refills | Status: DC

## 2016-05-23 MED ORDER — FUROSEMIDE 20 MG PO TABS: ORAL_TABLET | ORAL | 1 refills | Status: DC

## 2016-05-23 MED ORDER — LISINOPRIL 5 MG PO TABS: 5.0000 mg | ORAL_TABLET | Freq: Every day | ORAL | 1 refills | Status: DC

## 2016-05-23 MED ORDER — ATORVASTATIN CALCIUM 20 MG PO TABS: ORAL_TABLET | ORAL | 1 refills | Status: DC

## 2016-05-25 NOTE — Progress Notes (Signed)
Gerald Leitz Date of Birth: 1929-01-12 Medical Record [id number]  History of Present Illness: Cheyenne Cruz is seen for follow up of atrial fibrillation. She was seen in 2011 with symptoms of dyspnea. She had an Echo at that time that showed Normal LV function, AV sclerosis, and mild MR. A stress test showed very poor exercise tolerance. She had a cardiac cath that showed no CAD and normal right heart pressures.She had pulmonary evaluation at that time with Dr. Gwenette Greet with normal PFTs.  In 2015 she was found to be in atrial fibrillation.  She was asymptomatic.   Started on metoprolol and Xarelto. She later developed findings of diastolic CHF. She was placed on lasix with improvement.   On follow up today she is still slowing down.  Still has difficulty walking any distance or participating in exercise program due to dyspnea. She takes an occasional extra lasix for increased swelling. She is concerned about side effects of her medication particularly difficulty sleeping and fatigue. She denies any chest pain other than rare acid reflux.    Medication List       Accurate as of 05/27/16 10:40 AM. Always use your most recent med list.          atorvastatin 20 MG tablet Commonly known as:  LIPITOR TAKE ONE TABLET BY MOUTH ONCE DAILY TO TREAT  HYPERLIPIDEMIA.   calcium carbonate 600 MG Tabs tablet Commonly known as:  OS-CAL Take 600 mg by mouth. Take one tablet twice daily   Fish Oil 1000 MG Caps Take 1 capsule by mouth daily. Take 1 capsule once a day.   furosemide 20 MG tablet Commonly known as:  LASIX TAKE 2 TABLETS BY MOUTH DAILY AND 1 TABLET AS NEEDED IN THE EVENING   levothyroxine 50 MCG tablet Commonly known as:  SYNTHROID, LEVOTHROID Take 1 tablet (50 mcg total) by mouth daily.   lisinopril 5 MG tablet Commonly known as:  PRINIVIL,ZESTRIL Take 1 tablet (5 mg total) by mouth daily.   metoprolol tartrate 25 MG tablet Commonly known as:  LOPRESSOR Take 1 tablet (25 mg  total) by mouth 2 (two) times daily.   multivitamin tablet Take 1 tablet by mouth daily. Take 1 tablet once a day.   rivaroxaban 20 MG Tabs tablet Commonly known as:  XARELTO Take 1 tablet (20 mg total) by mouth daily with supper.   Vitamin D-3 1000 units Caps Take 1 capsule by mouth daily.       No Known Allergies  Past Medical History:  Diagnosis Date  . Abnormality of gait 10/09/2008  . Aortic valve sclerosis   . Atrial fibrillation, persistent ([place])   . Closed fracture of shaft of femur ([place]) 11.14/2012  . Enthesopathy of unspecified site 03/19/2009  . Fatigue   . H/O total hysterectomy   . Hyperlipidemia   . Hypertension   . MVP (mitral valve prolapse)   . Myalgia and myositis, unspecified 08/06/2010  . Nonspecific abnormal results of pulmonary system function study 08/06/2010  . Nontoxic uninodular goiter 08/06/2010  . OA (osteoarthritis)   . Obesity   . Osteoporosis, senile 08/15/2004  . Other abnormal blood chemistry 06/25/2010  . Overweight and obesity(278.0) 11/11/2012  . Pain in joint, ankle and foot 12/28/2008  . Plantar fasciitis   . Shortness of breath    WITH EXERTION  . Thyroid disease   . Unspecified asthma, with exacerbation 12/02/2005  . Unspecified essential hypertension 08/15/2004  . Unspecified hypothyroidism 08/06/2010  . Vitamin D deficiency  Past Surgical History:  Procedure Laterality Date  . ABDOMINAL HYSTERECTOMY  2001   Total Dr. Rexford Maus  . BASAL CELL CARCINOMA EXCISION  2006   Above left side of lip  . CATARACT EXTRACTION W/ INTRAOCULAR LENS  IMPLANT, BILATERAL  08/2015   Dr. Maricela Curet  . COLONOSCOPY  09/27/2008   Dr. Sammuel Cooper normal  . [place]  . [place]  . FRACTURE SURGERY Left 2012   hip ORIF  . SKIN CANCER EXCISION  2003   NOSE  . TONSILLECTOMY      Social History   Social History  . Marital status: Married    Spouse name: N/A  . Number of children: N/A  . Years of education: N/A    Occupational History  . 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 Smoking status: Never Smoker  . Smokeless tobacco: Never Used  . Alcohol use 0.0 oz/week    3 - 4 Glasses of wine per week     Comment: OCCASIONAL GLASS OF WINE  . Drug use: No  . Sexual activity: Not Asked   Other Topics Concern  . None   Social History Narrative   Married 1954, [name].    Retired Pharmacist, hospital.    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 Spends summers in [place].    No smoking history    drinks minimal amount of alcohol.   Exercise predominantly weight training, walking dog   DNR    Family History  Problem Relation Age of Onset  . Stroke Mother 85    FOLLOWING A BROKEN HIP  . Stroke Father   . Aneurysm Father     Abdominal aortic, repaired    Review of Systems: As noted in HPI.  All other systems were reviewed and are negative.  Physical Exam: BP (!) 152/84   Pulse 74   Ht 5' 1.5" (1.562 m)   Wt 187 lb 12.8 oz (85.2 kg)   BMI 34.91 kg/m  Filed Weights   05/27/16 1017  Weight: 187 lb 12.8 oz (85.2 kg)  GENERAL:  Well appearing obese WF in NAD. HEENT:  PERRL, EOMI, sclera are clear. Oropharynx is clear. NECK:  No jugular venous distention, carotid upstroke brisk and symmetric, no bruits, no thyromegaly or adenopathy LUNGS:  Clear to auscultation bilaterally CHEST:  Unremarkable HEART:  IRRR,  PMI not displaced or sustained,S1 and S2 within normal limits, no S3, no S4: no clicks, no rubs, no murmurs ABD:  Soft, nontender. BS +, no masses or bruits. No hepatomegaly, no splenomegaly EXT:  2 + pulses throughout, no edema, no cyanosis no clubbing. Bruise  SKIN:  Warm and dry.  No rashes NEURO:  Alert and oriented x 3. Cranial nerves II through XII intact. PSYCH:  Cognitively intact   LABORATORY DATA: Lab Results  Component Value Date   WBC 6.6 05/07/2016   HGB 13.1 05/07/2016   HCT 43 05/07/2016    PLT 214 05/07/2016   GLUCOSE 165 (H) 08/02/2015   CHOL 178 05/07/2016   TRIG 85 05/07/2016   HDL 87 (A) 05/07/2016   LDLCALC 74 05/07/2016   ALT 15 06/13/2015   AST 19 06/13/2015   NA 140 05/07/2016   K 4.3 05/07/2016   CL 103 08/02/2015   CREATININE 1.0 05/07/2016   BUN 27 (A) 05/07/2016   CO2 27 08/02/2015   TSH 3.63 05/07/2016  HGBA1C 6.0 05/07/2016   Ecg today shows Afib with rate 74. otherwise normal. I have personally reviewed and interpreted this study.  Assessment / Plan: 1. Atrial fibrillation chronic.  Rate currently well controlled on metoprolol. Anticoagulated with Xarelto with elevated CHAD-vasc score of 4.   2. HTN controlled. She is concerned about side effects on lisinopril. Will stop now. She is going to monitor BP closely at [facility]. If it stays high will consider an ARB instead.  3.  Chronic diastolic CHF. She appears to be euvolemic.  Will continue lasix  40 mg daily with extra 20 mg prn. Much of her dyspnea is  related to obesity and deconditioning. Normal right and left heart cath in 2011. Recommend sodium restriction.   4. Hypothyroidism. On replacement.   5. Hypercholesterolemia. Lipid levels have been excellent. Given her age will reduce atorvastatin to 10 mg daily  I will follow up in 4-6 months.

## 2016-05-27 ENCOUNTER — Ambulatory Visit (INDEPENDENT_AMBULATORY_CARE_PROVIDER_SITE_OTHER): Payer: Medicare Other | Admitting: Cardiology

## 2016-05-27 ENCOUNTER — Encounter: Payer: Self-pay | Admitting: Cardiology

## 2016-05-27 VITALS — BP 152/84 | HR 74 | Ht 61.5 in | Wt 187.8 lb

## 2016-05-27 DIAGNOSIS — I482 Chronic atrial fibrillation, unspecified: Secondary | ICD-10-CM

## 2016-05-27 DIAGNOSIS — E7800 Pure hypercholesterolemia, unspecified: Secondary | ICD-10-CM | POA: Diagnosis not present

## 2016-05-27 DIAGNOSIS — I10 Essential (primary) hypertension: Secondary | ICD-10-CM | POA: Diagnosis not present

## 2016-05-27 MED ORDER — ATORVASTATIN CALCIUM 10 MG PO TABS: 10.0000 mg | ORAL_TABLET | Freq: Every day | ORAL | 3 refills | Status: DC

## 2016-05-27 MED ORDER — FUROSEMIDE 40 MG PO TABS: ORAL_TABLET | ORAL | 3 refills | Status: DC

## 2016-05-27 NOTE — Addendum Note (Signed)
Addended by: Kathyrn Lass on: 05/27/2016 11:14 AM   Modules accepted: Orders

## 2016-05-27 NOTE — Patient Instructions (Signed)
Stop taking lisinopril. Monitor your blood pressure   Reduce atorvastatin to 10 mg daily  We will refill your lasix.   I will see you in 4-6 months

## 2016-07-05 ENCOUNTER — Encounter: Payer: Self-pay | Admitting: Internal Medicine

## 2016-08-14 ENCOUNTER — Encounter: Payer: Self-pay | Admitting: Internal Medicine

## 2016-08-21 ENCOUNTER — Non-Acute Institutional Stay: Payer: Medicare Other | Admitting: Internal Medicine

## 2016-08-21 ENCOUNTER — Encounter: Payer: Self-pay | Admitting: Internal Medicine

## 2016-08-21 VITALS — BP 112/70 | HR 68 | Temp 97.6°F | Wt 190.0 lb

## 2016-08-21 DIAGNOSIS — E039 Hypothyroidism, unspecified: Secondary | ICD-10-CM | POA: Diagnosis not present

## 2016-08-21 DIAGNOSIS — S9031XS Contusion of right foot, sequela: Secondary | ICD-10-CM | POA: Diagnosis not present

## 2016-08-21 DIAGNOSIS — R0602 Shortness of breath: Secondary | ICD-10-CM | POA: Diagnosis not present

## 2016-08-21 DIAGNOSIS — R739 Hyperglycemia, unspecified: Secondary | ICD-10-CM

## 2016-08-21 DIAGNOSIS — I10 Essential (primary) hypertension: Secondary | ICD-10-CM

## 2016-08-21 DIAGNOSIS — I482 Chronic atrial fibrillation, unspecified: Secondary | ICD-10-CM

## 2016-08-21 DIAGNOSIS — E7800 Pure hypercholesterolemia, unspecified: Secondary | ICD-10-CM

## 2016-08-21 NOTE — Progress Notes (Signed)
Location:  Occupational psychologist of Service:  Clinic (12)  Provider: Magic Mohler L. Mariea Clonts, D.O., C.M.D.  Code Status: DNR Goals of Care:  Advanced Directives 08/21/2016  Does Patient Have a Medical Advance Directive? Yes  Type of Advance Directive Out of facility DNR (pink MOST or yellow form);[place] in Chart? Yes  Pre-existing out of facility DNR order (yellow form or pink MOST form) Yellow form placed in chart (order not valid for inpatient use)     Chief Complaint  Patient presents with  . Medical Management of Chronic Issues    3 mth follow-up    HPI: Patient is a 83 y.o. female seen today for medical management of chronic diseases.    He has no medical problems while in [place].  She has some back spasms here and there.  Saw Dr. Martinique last in Oct and statin dose was reduced to 10mg  due to age and good results.  He stopped her lisinopril.  BP great here today.  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  She's been concerned about her husband.  He is finally seeing a specialist which makes her feel better--reports having had to nag him.  Dyspnea on exertion unchanged. Has not lost weight.  No chest pain.  No palpitations.  Had a cane dropped on her right foot.  It had a huge hematoma and still has a discoloration on the dorsum.  There is no pain remaining.   Past Medical History:  Diagnosis Date  . Abnormality of gait 10/09/2008  . Aortic valve sclerosis   . Atrial fibrillation, persistent ([place])   . Closed fracture of shaft of femur ([place]) 11.14/2012  . Enthesopathy of unspecified site 03/19/2009  . Fatigue   . H/O total hysterectomy   . Hyperlipidemia   . Hypertension   . MVP (mitral valve prolapse)   . Myalgia and myositis, unspecified 08/06/2010  . Nonspecific abnormal results of pulmonary system function study 08/06/2010  . Nontoxic uninodular goiter 08/06/2010  . OA  (osteoarthritis)   . Obesity   . Osteoporosis, senile 08/15/2004  . Other abnormal blood chemistry 06/25/2010  . Overweight and obesity(278.0) 11/11/2012  . Pain in joint, ankle and foot 12/28/2008  . Plantar fasciitis   . Shortness of breath    WITH EXERTION  . Thyroid disease   . Unspecified asthma, with exacerbation 12/02/2005  . Unspecified essential hypertension 08/15/2004  . Unspecified hypothyroidism 08/06/2010  . Vitamin D deficiency     Past Surgical History:  Procedure Laterality Date  . ABDOMINAL HYSTERECTOMY  2001   Total Dr. Rexford Maus  . BASAL CELL CARCINOMA EXCISION  2006   Above left side of lip  . CATARACT EXTRACTION W/ INTRAOCULAR LENS  IMPLANT, BILATERAL  08/2015   Dr. Maricela Curet  . COLONOSCOPY  09/27/2008   Dr. Sammuel Cooper normal  . [place]  . [place]  . FRACTURE SURGERY Left 2012   hip ORIF  . SKIN CANCER EXCISION  2003   NOSE  . TONSILLECTOMY      No Known Allergies  Allergies as of 08/21/2016   No Known Allergies     Medication List       Accurate as of 08/21/16  8:53 AM. Always use your most recent med list.          atorvastatin 10 MG tablet Commonly known as:  LIPITOR Take 1 tablet (10 mg total) by mouth daily.   calcium carbonate 600 MG Tabs tablet Commonly known as:  OS-CAL Take 600 mg by mouth. Take one tablet twice daily   Fish Oil 1000 MG Caps Take 1 capsule by mouth daily. Take 1 capsule once a day.   furosemide 40 MG tablet Commonly known as:  LASIX Take 40 mg in morning and 20 mg ( 1/2 tablet ) in pm if needed   levothyroxine 50 MCG tablet Commonly known as:  SYNTHROID, LEVOTHROID Take 1 tablet (50 mcg total) by mouth daily.   metoprolol tartrate 25 MG tablet Commonly known as:  LOPRESSOR Take 1 tablet (25 mg total) by mouth 2 (two) times daily.   multivitamin tablet Take 1 tablet by mouth daily. Take 1 tablet once a day.   rivaroxaban 20 MG Tabs tablet Commonly known as:  XARELTO Take  1 tablet (20 mg total) by mouth daily with supper.   Vitamin D-3 1000 units Caps Take 1 capsule by mouth daily.       Review of Systems:  Review of Systems  Constitutional: Negative for chills, fever and malaise/fatigue.  HENT: Positive for hearing loss.   Respiratory: Negative for shortness of breath.   Cardiovascular: Positive for leg swelling. Negative for chest pain and palpitations.  Gastrointestinal: Negative for abdominal pain, blood in stool, constipation and melena.  Genitourinary: Positive for frequency and urgency. Negative for dysuria.       On diuretic  Musculoskeletal: Positive for back pain and myalgias. Negative for falls.       Right foot swelling  Skin: Negative for itching and rash.  Neurological: Negative for dizziness, loss of consciousness and weakness.  Endo/Heme/Allergies: Bruises/bleeds easily.       On xarelto  Psychiatric/Behavioral: Negative for depression and memory loss. The patient is nervous/anxious and has insomnia.     Health Maintenance  Topic Date Due  . TETANUS/TDAP  10/17/2012  . INFLUENZA VACCINE  03/19/2016  . DEXA SCAN  Completed  . ZOSTAVAX  Completed  . PNA vac Low Risk Adult  Completed    Physical Exam: Vitals:   08/21/16 0840  BP: 112/70  Pulse: 68  Temp: 97.6 F (36.4 C)  TempSrc: Oral  SpO2: 96%  Weight: 190 lb (86.2 kg)   Body mass index is 35.32 kg/m. Physical Exam  Constitutional: She is oriented to person, place, and time. She appears well-developed and well-nourished. No distress.  Cardiovascular:  irreg irreg; nonpitting edema of feet bilaterally  Pulmonary/Chest: Effort normal and breath sounds normal. No respiratory distress.  Abdominal: Soft. Bowel sounds are normal. She exhibits no distension. There is no tenderness.  Musculoskeletal: Normal range of motion.  Purple discoloration and prominence of the dorsum of her right foot  Neurological: She is alert and oriented to person, place, and time.  Skin: No  rash noted. No erythema.  Psychiatric: She has a normal mood and affect.    Labs reviewed: Basic Metabolic Panel:  Recent Labs  12/26/15 05/07/16 0700  NA  --  140  K  --  4.3  BUN  --  27*  CREATININE  --  1.0  TSH 4.56 3.63   Liver Function Tests: No results for input(s): AST, ALT, ALKPHOS, BILITOT, PROT, ALBUMIN in the last 8760 hours. No results for input(s): LIPASE, AMYLASE in the last 8760 hours. No results for input(s): AMMONIA in the last 8760 hours. CBC:  Recent Labs  05/07/16 0700  WBC 6.6  HGB  13.1  HCT 43  PLT 214   Lipid Panel:  Recent Labs  05/07/16 0700  CHOL 178  HDL 87*  LDLCALC 74  TRIG 85   Lab Results  Component Value Date   HGBA1C 6.0 05/07/2016    Assessment/Plan 1. Chronic atrial fibrillation (HCC) -rate controlled on metoprolol, stable with anticoagulation on xarelto -follows with Dr. Martinique  2. Essential hypertension -bp remains controlled off lisinopril-was concerned about side effect so it was stopped  3. Hypothyroidism, unspecified type -last tsh normal, due again before annual  4. HYPERCHOLESTEROLEMIA -lipids were good last check with LDL 74, due to recheck before annual -statin dose was reduced by cardiology due to age and myalgia risks  5. Hyperglycemia -fasting sugars are elevated, but has not progressed to DMII--monitor  6. Shortness of breath -due to deconditioning primarily and obesity -encouraged low fat diet and exercise  7. Contusion of right foot, sequela -still has swelling and discoloration on the top of the right foot, but pain resolved -no fx was found -used RICE with improvement  Labs/tests ordered:  Cbc, cmp, flp, tsh before Next appt:  12/25/2016 annual wellness  Monie Shere L. Kamerin Axford, D.O. [facility] [address], [place] [address] Cell Phone (Mon-Fri 8am-5pm):  [phone] On Call:  [phone] & follow prompts after 5pm & weekends Office  Phone:  [phone] Office Fax:  [phone]

## 2016-09-03 ENCOUNTER — Ambulatory Visit
Admission: RE | Admit: 2016-09-03 | Discharge: 2016-09-03 | Disposition: A | Discharge status: Home / Self Care | Payer: Medicare Other | Source: Ambulatory Visit | Attending: Nurse Practitioner | Admitting: Nurse Practitioner

## 2016-09-03 ENCOUNTER — Ambulatory Visit (INDEPENDENT_AMBULATORY_CARE_PROVIDER_SITE_OTHER): Payer: Medicare Other | Admitting: Nurse Practitioner

## 2016-09-03 ENCOUNTER — Encounter: Payer: Self-pay | Admitting: Nurse Practitioner

## 2016-09-03 VITALS — BP 124/78 | HR 70 | Temp 97.7°F | Resp 18 | Ht 62.0 in | Wt 192.9 lb

## 2016-09-03 DIAGNOSIS — J209 Acute bronchitis, unspecified: Secondary | ICD-10-CM | POA: Diagnosis not present

## 2016-09-03 DIAGNOSIS — R05 Cough: Secondary | ICD-10-CM | POA: Diagnosis not present

## 2016-09-03 NOTE — Progress Notes (Signed)
Careteam: Patient Care Team: Gayland Curry, DO as PCP - General (Geriatric Medicine) Mardene Celeste, NP as Nurse Practitioner (Gerontology) [facility] Cletis Athens, MD (Inactive) as Consulting Physician (Gastroenterology) Peter M Martinique, MD as Consulting Physician (Cardiology)  Advanced Directive information Does Patient Have a Medical Advance Directive?: Yes, Type of Advance Directive: [place];Out of facility DNR (pink MOST or yellow form)  No Known Allergies  Chief Complaint  Patient presents with  . Acute Visit    cold/chest congestion x 6-7 days. Having deep, aching pain in center of chest, especially when coughing, since this morning.      HPI: Patient is a 83 y.o. female seen in the office today with complaints that she has had a bad cold for 7 days. Symptoms include chest congestion, a productive cough with yellow sputum, wheezing, and a deep ache in the center of her chest when breathing. Treatment tried include Robitussin and Nyquil; patient reports she was able to sleep without being woken up by cough.  Husband has been sick recently and is still getting over it.   Review of Systems:  Review of Systems  Constitutional: Positive for appetite change (no appetite). Negative for chills and fever.  HENT: Positive for congestion (chest) and rhinorrhea. Negative for postnasal drip, sinus pain, sinus pressure and sneezing.   Respiratory: Positive for cough, shortness of breath and wheezing.        Deep ache in the center of the chest when breathing  Cardiovascular: Positive for chest pain and leg swelling. Negative for palpitations.  Gastrointestinal: Positive for constipation. Negative for abdominal pain, diarrhea, nausea and vomiting.  Musculoskeletal:       No unusual aches or muscle pains.  Neurological: Negative for dizziness, light-headedness and headaches.    Past Medical History:  Diagnosis Date  . Abnormality of  gait 10/09/2008  . Aortic valve sclerosis   . Atrial fibrillation, persistent ([place])   . Closed fracture of shaft of femur ([place]) 11.14/2012  . Enthesopathy of unspecified site 03/19/2009  . Fatigue   . H/O total hysterectomy   . Hyperlipidemia   . Hypertension   . MVP (mitral valve prolapse)   . Myalgia and myositis, unspecified 08/06/2010  . Nonspecific abnormal results of pulmonary system function study 08/06/2010  . Nontoxic uninodular goiter 08/06/2010  . OA (osteoarthritis)   . Obesity   . Osteoporosis, senile 08/15/2004  . Other abnormal blood chemistry 06/25/2010  . Overweight and obesity(278.0) 11/11/2012  . Pain in joint, ankle and foot 12/28/2008  . Plantar fasciitis   . Shortness of breath    WITH EXERTION  . Thyroid disease   . Unspecified asthma, with exacerbation 12/02/2005  . Unspecified essential hypertension 08/15/2004  . Unspecified hypothyroidism 08/06/2010  . Vitamin D deficiency    Past Surgical History:  Procedure Laterality Date  . ABDOMINAL HYSTERECTOMY  2001   Total Dr. Rexford Maus  . BASAL CELL CARCINOMA EXCISION  2006   Above left side of lip  . CATARACT EXTRACTION W/ INTRAOCULAR LENS  IMPLANT, BILATERAL  08/2015   Dr. Maricela Curet  . COLONOSCOPY  09/27/2008   Dr. Sammuel Cooper normal  . [place]  . [place]  . FRACTURE SURGERY Left 2012   hip ORIF  . SKIN CANCER EXCISION  2003   NOSE  . TONSILLECTOMY     Social History:   reports that she has never smoked. She has never used  smokeless tobacco. She reports that she drinks alcohol. She reports that she does not use drugs.  Family History  Problem Relation Age of Onset  . Stroke Mother 9    FOLLOWING A BROKEN HIP  . Stroke Father   . Aneurysm Father     Abdominal aortic, repaired    Medications: Patient's Medications  New Prescriptions   No medications on file  Previous Medications   ATORVASTATIN (LIPITOR) 10 MG TABLET    Take 1 tablet (10 mg total) by mouth daily.    CALCIUM CARBONATE (OS-CAL) 600 MG TABS TABLET    Take 600 mg by mouth. Take one tablet twice daily   CHOLECALCIFEROL (VITAMIN D-3) 1000 UNITS CAPS    Take 1 capsule by mouth daily.    FUROSEMIDE (LASIX) 40 MG TABLET    Take 40 mg in morning and 20 mg ( 1/2 tablet ) in pm if needed   LEVOTHYROXINE (SYNTHROID, LEVOTHROID) 50 MCG TABLET    Take 1 tablet (50 mcg total) by mouth daily.   METOPROLOL TARTRATE (LOPRESSOR) 25 MG TABLET    Take 1 tablet (25 mg total) by mouth 2 (two) times daily.   MULTIPLE VITAMIN (MULTIVITAMIN) TABLET    Take 1 tablet by mouth daily. Take 1 tablet once a day.   OMEGA-3 FATTY ACIDS (FISH OIL) 1000 MG CAPS    Take 1 capsule by mouth daily. Take 1 capsule once a day.   RIVAROXABAN (XARELTO) 20 MG TABS TABLET    Take 1 tablet (20 mg total) by mouth daily with supper.  Modified Medications   No medications on file  Discontinued Medications   No medications on file     Physical Exam:  Vitals:   09/03/16 1057  BP: 124/78  Pulse: 70  Resp: 18  Temp: 97.7 F (36.5 C)  TempSrc: Oral  SpO2: 94%  Weight: 192 lb 14.4 oz (87.5 kg)  Height: 5\' 2"  (1.575 m)   Body mass index is 35.28 kg/m.  Physical Exam  Constitutional: She is oriented to person, place, and time. She appears well-developed and well-nourished.  HENT:  Head: Normocephalic and atraumatic.  Mouth/Throat: Oropharynx is clear and moist.  Eyes: Pupils are equal, round, and reactive to light.  Neck: Normal range of motion. Neck supple.  Cardiovascular: Normal rate.  An irregularly irregular rhythm present.  Pulmonary/Chest: Effort normal. She has decreased breath sounds.  Abdominal: Soft. Bowel sounds are normal. She exhibits no distension. There is no tenderness. There is no rebound.  Musculoskeletal: She exhibits edema (1+ BLE).  Neurological: She is alert and oriented to person, place, and time.  Skin: Skin is warm and dry.  Psychiatric: She has a normal mood and affect. Her behavior is normal.  Judgment and thought content normal.    Labs reviewed: Basic Metabolic Panel:  Recent Labs  12/26/15 05/07/16 0700  NA  --  140  K  --  4.3  BUN  --  27*  CREATININE  --  1.0  TSH 4.56 3.63   Liver Function Tests: No results for input(s): AST, ALT, ALKPHOS, BILITOT, PROT, ALBUMIN in the last 8760 hours. No results for input(s): LIPASE, AMYLASE in the last 8760 hours. No results for input(s): AMMONIA in the last 8760 hours. CBC:  Recent Labs  05/07/16 0700  WBC 6.6  HGB 13.1  HCT 43  PLT 214   Lipid Panel:  Recent Labs  05/07/16 0700  CHOL 178  HDL 87*  LDLCALC 74  TRIG 85  TSH:  Recent Labs  12/26/15 05/07/16 0700  TSH 4.56 3.63   A1C: Lab Results  Component Value Date   HGBA1C 6.0 05/07/2016     Assessment/Plan 1. Acute bronchitis, unspecified organism - Increase hydration and good nutrition. - Mucinex DM 1 tablet twice a day with a full glass of water for 7 days then as needed  - Tylenol 325 mg (2 tablets) Q6H PRN for aches/pains. - DG Chest 2 View to r/o pneumonia   Follow-up as needed.  Carlos American. Harle Battiest  [facility] [phone] 8 am - 5 pm) [phone] (after hours)

## 2016-09-03 NOTE — Patient Instructions (Addendum)
  Increase hydration mucinex DM by mouth twice daily for 1 week  To get chest xray to rule out Pnemonia  May use tylenol 325 2 tablets every 6 hours as needed for pain  Acute Bronchitis, Adult Acute bronchitis is when air tubes (bronchi) in the lungs suddenly get swollen. The condition can make it hard to breathe. It can also cause these symptoms:  A cough.  Coughing up clear, yellow, or green mucus.  Wheezing.  Chest congestion.  Shortness of breath.  A fever.  Body aches.  Chills.  A sore throat. Follow these instructions at home: Medicines  Take over-the-counter and prescription medicines only as told by your doctor.  If you were prescribed an antibiotic medicine, take it as told by your doctor. Do not stop taking the antibiotic even if you start to feel better. General instructions  Rest.  Drink enough fluids to keep your pee (urine) clear or pale yellow.  Avoid smoking and secondhand smoke. If you smoke and you need help quitting, ask your doctor. Quitting will help your lungs heal faster.  Use an inhaler, cool mist vaporizer, or humidifier as told by your doctor.  Keep all follow-up visits as told by your doctor. This is important. How is this prevented? To lower your risk of getting this condition again:  Wash your hands often with soap and water. If you cannot use soap and water, use hand sanitizer.  Avoid contact with people who have cold symptoms.  Try not to touch your hands to your mouth, nose, or eyes.  Make sure to get the flu shot every year. Contact a doctor if:  Your symptoms do not get better in 2 weeks. Get help right away if:  You cough up blood.  You have chest pain.  You have very bad shortness of breath.  You become dehydrated.  You faint (pass out) or keep feeling like you are going to pass out.  You keep throwing up (vomiting).  You have a very bad headache.  Your fever or chills gets worse. This information is not  intended to replace advice given to you by your health care provider. Make sure you discuss any questions you have with your health care provider. Document Released: 01/22/2008 Document Revised: 03/13/2016 Document Reviewed: 01/24/2016 Elsevier Interactive Patient Education  2017 [employer].

## 2016-09-09 DIAGNOSIS — H26491 Other secondary cataract, right eye: Secondary | ICD-10-CM | POA: Diagnosis not present

## 2016-09-09 DIAGNOSIS — H5213 Myopia, bilateral: Secondary | ICD-10-CM | POA: Diagnosis not present

## 2016-09-13 ENCOUNTER — Emergency Department (HOSPITAL_COMMUNITY): Payer: Medicare Other

## 2016-09-13 ENCOUNTER — Encounter (HOSPITAL_COMMUNITY): Payer: Self-pay

## 2016-09-13 ENCOUNTER — Emergency Department (HOSPITAL_COMMUNITY)
Admission: EM | Admit: 2016-09-13 | Discharge: 2016-09-13 | Disposition: A | Discharge status: Home / Self Care | Payer: Medicare Other | Attending: Emergency Medicine | Admitting: Emergency Medicine

## 2016-09-13 DIAGNOSIS — E039 Hypothyroidism, unspecified: Secondary | ICD-10-CM | POA: Diagnosis not present

## 2016-09-13 DIAGNOSIS — J441 Chronic obstructive pulmonary disease with (acute) exacerbation: Secondary | ICD-10-CM

## 2016-09-13 DIAGNOSIS — R05 Cough: Secondary | ICD-10-CM | POA: Diagnosis not present

## 2016-09-13 DIAGNOSIS — I5032 Chronic diastolic (congestive) heart failure: Secondary | ICD-10-CM | POA: Diagnosis not present

## 2016-09-13 DIAGNOSIS — Z7901 Long term (current) use of anticoagulants: Secondary | ICD-10-CM | POA: Diagnosis not present

## 2016-09-13 DIAGNOSIS — Z79899 Other long term (current) drug therapy: Secondary | ICD-10-CM | POA: Diagnosis not present

## 2016-09-13 DIAGNOSIS — R0789 Other chest pain: Secondary | ICD-10-CM | POA: Diagnosis not present

## 2016-09-13 DIAGNOSIS — R079 Chest pain, unspecified: Secondary | ICD-10-CM | POA: Diagnosis not present

## 2016-09-13 DIAGNOSIS — R0602 Shortness of breath: Secondary | ICD-10-CM

## 2016-09-13 DIAGNOSIS — R0600 Dyspnea, unspecified: Secondary | ICD-10-CM | POA: Diagnosis not present

## 2016-09-13 DIAGNOSIS — Z85828 Personal history of other malignant neoplasm of skin: Secondary | ICD-10-CM | POA: Insufficient documentation

## 2016-09-13 DIAGNOSIS — I10 Essential (primary) hypertension: Secondary | ICD-10-CM | POA: Diagnosis not present

## 2016-09-13 DIAGNOSIS — R9431 Abnormal electrocardiogram [ECG] [EKG]: Secondary | ICD-10-CM | POA: Diagnosis present

## 2016-09-13 LAB — BASIC METABOLIC PANEL
ANION GAP: 13 (ref 5–15)
BUN: 12 mg/dL (ref 6–20)
CALCIUM: 7.9 mg/dL — AB (ref 8.9–10.3)
CO2: 25 mmol/L (ref 22–32)
Chloride: 95 mmol/L — ABNORMAL LOW (ref 101–111)
Creatinine, Ser: 0.81 mg/dL (ref 0.44–1.00)
GFR calc Af Amer: >60 mL/min (ref >60)
GLUCOSE: 151 mg/dL — AB (ref 65–99)
Potassium: 3.2 mmol/L — ABNORMAL LOW (ref 3.5–5.1)
SODIUM: 133 mmol/L — AB (ref 135–145)

## 2016-09-13 LAB — CBC WITH DIFFERENTIAL/PLATELET
BASOS ABS: 0 10*3/uL (ref 0.0–0.1)
BASOS PCT: 1 %
EOS ABS: 0 10*3/uL (ref 0.0–0.7)
EOS PCT: 0 %
HCT: 37.2 % (ref 36.0–46.0)
Hemoglobin: 12.3 g/dL (ref 12.0–15.0)
Lymphocytes Relative: 11 %
Lymphs Abs: 0.7 10*3/uL (ref 0.7–4.0)
MCH: 28 pg (ref 26.0–34.0)
MCHC: 33.1 g/dL (ref 30.0–36.0)
MCV: 84.7 fL (ref 78.0–100.0)
MONO ABS: 0.7 10*3/uL (ref 0.1–1.0)
Monocytes Relative: 10 %
Neutro Abs: 5.2 10*3/uL (ref 1.7–7.7)
Neutrophils Relative %: 78 %
PLATELETS: 290 10*3/uL (ref 150–400)
RBC: 4.39 MIL/uL (ref 3.87–5.11)
RDW: 14.3 % (ref 11.5–15.5)
WBC: 6.6 10*3/uL (ref 4.0–10.5)

## 2016-09-13 LAB — I-STAT TROPONIN, ED: TROPONIN I, POC: 0 ng/mL (ref 0.00–0.08)

## 2016-09-13 LAB — I-STAT CHEM 8, ED
BUN: 12 mg/dL (ref 6–20)
CALCIUM ION: 1 mmol/L — AB (ref 1.15–1.40)
Chloride: 95 mmol/L — ABNORMAL LOW (ref 101–111)
Creatinine, Ser: 0.8 mg/dL (ref 0.44–1.00)
GLUCOSE: 151 mg/dL — AB (ref 65–99)
HCT: 36 % (ref 36.0–46.0)
HEMOGLOBIN: 12.2 g/dL (ref 12.0–15.0)
POTASSIUM: 3.3 mmol/L — AB (ref 3.5–5.1)
SODIUM: 134 mmol/L — AB (ref 135–145)
TCO2: 28 mmol/L (ref 0–100)

## 2016-09-13 NOTE — ED Notes (Signed)
Pt stable, understands discharge instructions, and reasons for return.   

## 2016-09-13 NOTE — ED Triage Notes (Signed)
Pt reports to the ED via [facility] from [facility] with reports of abnormal EKG. Per EMS pt with hx of afib. Pt on Xarelto. Per EMS pt with chest pain/congestion X several months. Pt given duoneb en route and IM steroids PTA.

## 2016-09-13 NOTE — ED Notes (Signed)
Pt not willing to wait for discharge paperwork or recent set of vitals. MD made aware.

## 2016-09-13 NOTE — ED Provider Notes (Signed)
[facility] DEPT Provider Note  CSN: [id number] Arrival date & time: 09/13/16  2018  History   Chief Complaint Chief Complaint  Patient presents with  . Abnormal ECG   HPI Cheyenne Cruz is a 83 y.o. female.  The history is provided by the patient, medical records and a relative. No language interpreter was used.  Illness  This is a new problem. The current episode started more than 1 week ago. The problem occurs constantly. The problem has been gradually improving. Associated symptoms include chest pain (aching, bilateral, worse with cough and movement) and shortness of breath (improving). Pertinent negatives include no abdominal pain and no headaches. Exacerbated by: Movement, cough. Nothing relieves the symptoms.    Past Medical History:  Diagnosis Date  . Abnormality of gait 10/09/2008  . Aortic valve sclerosis   . Atrial fibrillation, persistent ([place])   . Closed fracture of shaft of femur ([place]) 11.14/2012  . Enthesopathy of unspecified site 03/19/2009  . Fatigue   . H/O total hysterectomy   . Hyperlipidemia   . Hypertension   . MVP (mitral valve prolapse)   . Myalgia and myositis, unspecified 08/06/2010  . Nonspecific abnormal results of pulmonary system function study 08/06/2010  . Nontoxic uninodular goiter 08/06/2010  . OA (osteoarthritis)   . Obesity   . Osteoporosis, senile 08/15/2004  . Other abnormal blood chemistry 06/25/2010  . Overweight and obesity(278.0) 11/11/2012  . Pain in joint, ankle and foot 12/28/2008  . Plantar fasciitis   . Shortness of breath    WITH EXERTION  . Thyroid disease   . Unspecified asthma, with exacerbation 12/02/2005  . Unspecified essential hypertension 08/15/2004  . Unspecified hypothyroidism 08/06/2010  . Vitamin D deficiency    Patient Active Problem List   Diagnosis Date Noted  . Osteoarthritis 07/20/2014  . Atrial fibrillation ([place]) 07/06/2014  . Healthcare maintenance 12/22/2013  . Hyperglycemia 01/06/2013  .  Overweight and obesity(278.0) 11/11/2012  . Dyspnea 08/29/2010  . Hypothyroidism 08/28/2010  . HYPERCHOLESTEROLEMIA 08/28/2010  . Essential hypertension 08/28/2010  . SENILE OSTEOPOROSIS 08/28/2010  . SHORTNESS OF BREATH 08/28/2010   Past Surgical History:  Procedure Laterality Date  . ABDOMINAL HYSTERECTOMY  2001   Total Dr. Rexford Maus  . BASAL CELL CARCINOMA EXCISION  2006   Above left side of lip  . CATARACT EXTRACTION W/ INTRAOCULAR LENS  IMPLANT, BILATERAL  08/2015   Dr. Maricela Curet  . COLONOSCOPY  09/27/2008   Dr. Sammuel Cooper normal  . [place]  . [place]  . FRACTURE SURGERY Left 2012   hip ORIF  . SKIN CANCER EXCISION  2003   NOSE  . TONSILLECTOMY     OB History    No data available      Home Medications    Prior to Admission medications   Medication Sig Start Date End Date Taking? Authorizing Provider  atorvastatin (LIPITOR) 10 MG tablet Take 1 tablet (10 mg total) by mouth daily. 05/27/16 08/25/16  Peter M Martinique, MD  calcium carbonate (OS-CAL) 600 MG TABS tablet Take 600 mg by mouth. Take one tablet twice daily    Historical Provider, MD  Cholecalciferol (VITAMIN D-3) 1000 UNITS CAPS Take 1 capsule by mouth daily.     Historical Provider, MD  furosemide (LASIX) 40 MG tablet Take 40 mg in morning and 20 mg ( 1/2 tablet ) in pm if needed 05/27/16   Peter M Martinique, MD  levothyroxine (SYNTHROID, LEVOTHROID) 50 MCG tablet Take 1  tablet (50 mcg total) by mouth daily. 05/23/16   Tiffany L Reed, DO  metoprolol tartrate (LOPRESSOR) 25 MG tablet Take 1 tablet (25 mg total) by mouth 2 (two) times daily. 05/23/16   Tiffany L Reed, DO  Multiple Vitamin (MULTIVITAMIN) tablet Take 1 tablet by mouth daily. Take 1 tablet once a day.    Historical Provider, MD  Omega-3 Fatty Acids (FISH OIL) 1000 MG CAPS Take 1 capsule by mouth daily. Take 1 capsule once a day.    Historical Provider, MD  rivaroxaban (XARELTO) 20 MG TABS tablet Take 1 tablet (20 mg total) by  mouth daily with supper. 05/23/16   Gayland Curry, DO   Family History Family History  Problem Relation Age of Onset  . Stroke Mother 75    FOLLOWING A BROKEN HIP  . Stroke Father   . Aneurysm Father     Abdominal aortic, repaired   Social History Social History  Substance Use Topics  . Smoking status: Never Smoker  . Smokeless tobacco: Never Used  . Alcohol use 0.0 oz/week    3 - 4 Glasses of wine per week     Comment: Malcolm    Allergies   Patient has no known allergies.   Review of Systems Review of Systems  Constitutional: Positive for fever. Negative for chills and diaphoresis.  Respiratory: Positive for cough (improving), shortness of breath (improving) and wheezing (improving).   Cardiovascular: Positive for chest pain (aching, bilateral, worse with cough and movement).  Gastrointestinal: Negative for abdominal pain and nausea.  Neurological: Negative for headaches.  All other systems reviewed and are negative.   Physical Exam Updated Vital Signs BP 130/78   Pulse 92   Temp 98.4 F (36.9 C) (Oral)   Resp 16   SpO2 92%   Physical Exam  Constitutional: She is oriented to person, place, and time. No distress.  Elderly Caucasian female  HENT:  Head: Normocephalic and atraumatic.  Eyes: EOM are normal. Pupils are equal, round, and reactive to light.  Neck: Normal range of motion. Neck supple.  Cardiovascular: Normal rate, regular rhythm and normal heart sounds.   Pulmonary/Chest: Effort normal. She has wheezes (faint end expiratory).  No increased WOB, maintaining sat on RA  Abdominal: Soft. Bowel sounds are normal. She exhibits no distension. There is no tenderness.  Musculoskeletal: Normal range of motion.  Neurological: She is alert and oriented to person, place, and time.  Skin: Skin is warm and dry. Capillary refill takes less than 2 seconds. She is not diaphoretic.  Nursing note and vitals reviewed.   ED Treatments / Results    Labs (all labs ordered are listed, but only abnormal results are displayed) Labs Reviewed  BASIC METABOLIC PANEL - Abnormal; Notable for the following:       Result Value   Sodium 133 (*)    Potassium 3.2 (*)    Chloride 95 (*)    Glucose, Bld 151 (*)    Calcium 7.9 (*)    All other components within normal limits  I-STAT CHEM 8, ED - Abnormal; Notable for the following:    Sodium 134 (*)    Potassium 3.3 (*)    Chloride 95 (*)    Glucose, Bld 151 (*)    Calcium, Ion 1.00 (*)    All other components within normal limits  CBC WITH DIFFERENTIAL/PLATELET  Randolm Idol, ED   EKG  EKG Interpretation  Date/Time:  Friday September 13 2016 20:30:28 EST Ventricular  Rate:  86 PR Interval:    QRS Duration: 105 QT Interval:  389 QTC Calculation: 466 R Axis:   10 Text Interpretation:  Atrial fibrillation Anteroseptal infarct, old A fib new from previous EKG in 2002 Confirmed by LITTLE MD, RACHEL [phone]) on 09/13/2016 9:19:52 PM      Radiology Dg Chest Portable 1 View  Result Date: 09/13/2016 CLINICAL DATA:  83 year old female with COPD and nonproductive cough. EXAM: PORTABLE CHEST 1 VIEW COMPARISON:  Chest radiograph dated 09/03/2016 and CT dated 05/23/2016 FINDINGS: The patient is slightly rotated to the left. The lungs are clear. Left upper lobe calcified granuloma. There is no pleural effusion or pneumothorax. The cardiac silhouette is within normal limits given patient's rotation. Old healed right posterior rib fracture. No acute fracture. IMPRESSION: No active disease. Electronically Signed   By: Anner Crete M.D.   On: 09/13/2016 21:59   Procedures Procedures (including critical care time)  Medications Ordered in ED Medications - No data to display   Initial Impression / Assessment and Plan / ED Course  I have reviewed the triage vital signs and the nursing notes.  83 y.o. female with above stated PMHx, HPI, and physical. PMHx of COPD. Staying in independent living  facility. Cough & wheeze x2 weeks. Being treated with duo-nebs & IM steroids. Improving. Repeat EKG today CP (worse with movement, cough, & palpation) reading "anterolateral MI" and patient sent here for evaluation.  All symptoms have been improving over past 2 weeks. EKG showing old anteroseptal infarct which is unchanged from prior EKG as well as no evidence of ST elevation/depression or new T-wave inversions or interval abnormalities or dysrhythmias. I-STAT troponin undetectable. CBC without leukocytosis or acute anemia. BMP relatively unremarkable aside from mildly low potassium. CXR (-) for PNA.  Laboratory and imaging results were personally reviewed by myself and used in the medical decision making of this patient's treatment and disposition.  Patient given resources of dietary supplementation for potassium and will be discharged back to her facility for further treatment of her improving COPD exacerbation. Pt discharged home in stable condition. Strict ED return precautions dicussed. Pt understands and agrees with the plan and has no further questions or concerns.   Pt care discussed with and followed by my attending, Dr. Dola Argyle, MD Pager [phone]  Final Clinical Impressions(s) / ED Diagnoses   Final diagnoses:  COPD exacerbation ([place])  Chest wall pain  SOB (shortness of breath)   New Prescriptions New Prescriptions   No medications on file     Mayer Camel, MD 09/13/16 [place], MD 09/23/16 [phone]

## 2016-09-16 ENCOUNTER — Non-Acute Institutional Stay (SKILLED_NURSING_FACILITY): Payer: Medicare Other | Admitting: Adult Health

## 2016-09-16 ENCOUNTER — Encounter: Payer: Self-pay | Admitting: Adult Health

## 2016-09-16 DIAGNOSIS — I10 Essential (primary) hypertension: Secondary | ICD-10-CM | POA: Diagnosis not present

## 2016-09-16 DIAGNOSIS — R531 Weakness: Secondary | ICD-10-CM

## 2016-09-16 DIAGNOSIS — I482 Chronic atrial fibrillation, unspecified: Secondary | ICD-10-CM

## 2016-09-16 DIAGNOSIS — I5032 Chronic diastolic (congestive) heart failure: Secondary | ICD-10-CM

## 2016-09-16 DIAGNOSIS — E039 Hypothyroidism, unspecified: Secondary | ICD-10-CM | POA: Diagnosis not present

## 2016-09-16 DIAGNOSIS — J209 Acute bronchitis, unspecified: Secondary | ICD-10-CM

## 2016-09-16 DIAGNOSIS — E876 Hypokalemia: Secondary | ICD-10-CM

## 2016-09-16 DIAGNOSIS — E7800 Pure hypercholesterolemia, unspecified: Secondary | ICD-10-CM | POA: Diagnosis not present

## 2016-09-16 DIAGNOSIS — R739 Hyperglycemia, unspecified: Secondary | ICD-10-CM

## 2016-09-16 DIAGNOSIS — I5030 Unspecified diastolic (congestive) heart failure: Secondary | ICD-10-CM | POA: Insufficient documentation

## 2016-09-16 DIAGNOSIS — R9431 Abnormal electrocardiogram [ECG] [EKG]: Secondary | ICD-10-CM | POA: Diagnosis not present

## 2016-09-16 NOTE — Progress Notes (Signed)
Location:  Occupational psychologist of Service:  SNF (31) Provider:   Cindi Carbon, ANP [facility] [phone]   REED, Jonelle Sidle, DO  Patient Care Team: Gayland Curry, DO as PCP - General (Geriatric Medicine) [facility] Cletis Athens, MD (Inactive) as Consulting Physician (Gastroenterology) Peter M Martinique, MD as Consulting Physician (Cardiology)  Extended Emergency Contact Information Primary Emergency Contact: Lorel Monaco Address: [place], [place] [address] [place] Phone: [phone] Relation: Spouse Secondary Emergency Contact: [place] Phone: [phone] Relation: Daughter  Code Status:  DNR Goals of care: Advanced Directive information Advanced Directives 09/16/2016  Does Patient Have a Medical Advance Directive? Yes  Type of Paramedic of [place];Out of facility DNR (pink MOST or yellow form)  Does patient want to make changes to medical advance directive? No - Patient declined  Copy of [place] in Chart? -  Pre-existing out of facility DNR order (yellow form or pink MOST form) Yellow form placed in chart (order not valid for inpatient use)     Chief Complaint  Patient presents with  . Acute Visit    cough/wheeze    HPI:  Pt is a 83 y.o. female seen today after admission to [facility] rehab on 09/13/16.  She reports coughing and wheezing x 1 month. This worsened over time and she developed chest tightness and felt very weak as she was not eating well. She was advised to go to the ER but declined and came to rehab instead. She was given 40 mg of Depomedrol and a CXR was ordered which was negative for pna.  She was prescribed xopenex/atrovent q 6 hrs and doxycycline 100 mg BID for 7 days.  On arrival to rehab her EKG showed a possible acute MI so she agreed to go to the ER. Upon  evaluation there, her troponin was negative and her EKG showed afib with an old anteroseptal infarct which unchanged from prior ekgs.  Her K was low at 3.3 and so Kdur was ordered 20 meq qd for 3 days.  CXR was negative for pna  She was discharged back to rehab.  She reports that she is feeling much better but still has a cough with minimal sputum production, She stills has wheezing as well but sats are WNL.  No fever. She endorses shortness of breath but not with activity and upon further questioning I believe she just means she is still coughing and not entirely well. There is no more chest tightness.   She is concerned about her husband who is developing dementia.  She does not feel ready to return home and needs more time to get well here so that she can help take care of her husband when she returns home.   Past Medical History:  Diagnosis Date  . Abnormality of gait 10/09/2008  . Aortic valve sclerosis   . Atrial fibrillation, persistent ([place])   . Closed fracture of shaft of femur ([place]) 11.14/2012  . Enthesopathy of unspecified site 03/19/2009  . Fatigue   . H/O total hysterectomy   . Hyperlipidemia   . Hypertension   . MVP (mitral valve prolapse)   . Myalgia and myositis, unspecified 08/06/2010  . Nonspecific abnormal results of pulmonary system function study 08/06/2010  . Nontoxic uninodular goiter 08/06/2010  . OA (osteoarthritis)   . Obesity   .  Osteoporosis, senile 08/15/2004  . Other abnormal blood chemistry 06/25/2010  . Overweight and obesity(278.0) 11/11/2012  . Pain in joint, ankle and foot 12/28/2008  . Plantar fasciitis   . Shortness of breath    WITH EXERTION  . Thyroid disease   . Unspecified asthma, with exacerbation 12/02/2005  . Unspecified essential hypertension 08/15/2004  . Unspecified hypothyroidism 08/06/2010  . Vitamin D deficiency    Past Surgical History:  Procedure Laterality Date  . ABDOMINAL HYSTERECTOMY  2001   Total Dr. Rexford Maus  . BASAL CELL  CARCINOMA EXCISION  2006   Above left side of lip  . CATARACT EXTRACTION W/ INTRAOCULAR LENS  IMPLANT, BILATERAL  08/2015   Dr. Maricela Curet  . COLONOSCOPY  09/27/2008   Dr. Sammuel Cooper normal  . [place]  . [place]  . FRACTURE SURGERY Left 2012   hip ORIF  . SKIN CANCER EXCISION  2003   NOSE  . TONSILLECTOMY      No Known Allergies  Allergies as of 09/16/2016   No Known Allergies     Medication List       Accurate as of 09/16/16  9:47 AM. Always use your most recent med list.          atorvastatin 10 MG tablet Commonly known as:  LIPITOR Take 1 tablet (10 mg total) by mouth daily.   calcium carbonate 600 MG Tabs tablet Commonly known as:  OS-CAL Take 600 mg by mouth. Take one tablet twice daily   Fish Oil 1000 MG Caps Take 1 capsule by mouth daily. Take 1 capsule once a day.   furosemide 40 MG tablet Commonly known as:  LASIX Take 40 mg in morning and 20 mg ( 1/2 tablet ) in pm if needed   levothyroxine 50 MCG tablet Commonly known as:  SYNTHROID, LEVOTHROID Take 1 tablet (50 mcg total) by mouth daily.   metoprolol tartrate 25 MG tablet Commonly known as:  LOPRESSOR Take 1 tablet (25 mg total) by mouth 2 (two) times daily.   multivitamin tablet Take 1 tablet by mouth daily. Take 1 tablet once a day.   rivaroxaban 20 MG Tabs tablet Commonly known as:  XARELTO Take 1 tablet (20 mg total) by mouth daily with supper.   Vitamin D-3 1000 units Caps Take 1 capsule by mouth daily.       Review of Systems  Constitutional: Positive for fatigue. Negative for activity change, appetite change, chills, diaphoresis, fever and unexpected weight change.  HENT: Negative for congestion, sore throat and trouble swallowing.   Respiratory: Positive for cough and wheezing. Negative for shortness of breath.   Cardiovascular: Positive for leg swelling. Negative for chest pain and palpitations.  Gastrointestinal: Negative for abdominal distention,  abdominal pain, constipation and diarrhea.  Genitourinary: Negative for difficulty urinating and dysuria.  Musculoskeletal: Positive for gait problem. Negative for arthralgias, back pain, joint swelling and myalgias.  Neurological: Negative for dizziness, tremors, seizures, syncope, facial asymmetry, speech difficulty, weakness, light-headedness, numbness and headaches.  Psychiatric/Behavioral: Negative for agitation, behavioral problems and confusion.    Immunization History  Administered Date(s) Administered  . Influenza Whole 04/19/2010, 05/19/2012, 06/18/2013  . Influenza,inj,Quad PF,36+ Mos 06/29/2014  . Influenza-Unspecified 06/08/2015, 04/19/2016  . Pneumococcal Conjugate-13 05/15/2016  . Pneumococcal Polysaccharide-23 05/19/2008  . Td 10/18/2002  . Zoster 07/07/2006   Pertinent  Health Maintenance Due  Topic Date Due  . INFLUENZA VACCINE  Completed  . DEXA SCAN  Completed  .  PNA vac Low Risk Adult  Completed   Fall Risk  09/03/2016 08/21/2016 05/15/2016 01/03/2016 10/11/2015  Falls in the past year? No No No No No   Functional Status Survey:    Vitals:   09/16/16 0942  BP: 136/86  Pulse: 86  Resp: 19  Temp: 97.1 F (36.2 C)  SpO2: 96%   There is no height or weight on file to calculate BMI. Physical Exam  Constitutional: She is oriented to person, place, and time. No distress.  HENT:  Head: Normocephalic and atraumatic.  Right Ear: External ear normal.  Left Ear: External ear normal.  Nose: Nose normal.  Mouth/Throat: Oropharynx is clear and moist. No oropharyngeal exudate.  Eyes: Conjunctivae and EOM are normal. Pupils are equal, round, and reactive to light. Right eye exhibits no discharge. Left eye exhibits no discharge.  Neck: No JVD present.  Cardiovascular: Normal rate and regular rhythm.   No murmur heard. Trace edema bilat  Pulmonary/Chest: Effort normal. No respiratory distress. She has wheezes (bilat, mild through out).  Abdominal: Soft. Bowel sounds  are normal. She exhibits no distension.  Musculoskeletal: She exhibits no edema or tenderness.  Neurological: She is alert and oriented to person, place, and time.  Skin: Skin is warm and dry. She is not diaphoretic.  Psychiatric: She has a normal mood and affect.  Nursing note and vitals reviewed.   Labs reviewed:  Recent Labs  05/07/16 0700 09/13/16 2138 09/13/16 2152  NA 140 133* 134*  K 4.3 3.2* 3.3*  CL  --  95* 95*  CO2  --  25  --   GLUCOSE  --  151* 151*  BUN 27* 12 12  CREATININE 1.0 0.81 0.80  CALCIUM  --  7.9*  --    No results for input(s): AST, ALT, ALKPHOS, BILITOT, PROT, ALBUMIN in the last 8760 hours.  Recent Labs  05/07/16 0700 09/13/16 2138 09/13/16 2152  WBC 6.6 6.6  --   NEUTROABS  --  5.2  --   HGB 13.1 12.3 12.2  HCT 43 37.2 36.0  MCV  --  84.7  --   PLT 214 290  --    Lab Results  Component Value Date   TSH 3.63 05/07/2016   Lab Results  Component Value Date   HGBA1C 6.0 05/07/2016   Lab Results  Component Value Date   CHOL 178 05/07/2016   HDL 87 (A) 05/07/2016   LDLCALC 74 05/07/2016   TRIG 85 05/07/2016    Significant Diagnostic Results in last 30 days:  Dg Chest Portable 1 View  Result Date: 09/13/2016 CLINICAL DATA:  83 year old female with COPD and nonproductive cough. EXAM: PORTABLE CHEST 1 VIEW COMPARISON:  Chest radiograph dated 09/03/2016 and CT dated 05/23/2016 FINDINGS: The patient is slightly rotated to the left. The lungs are clear. Left upper lobe calcified granuloma. There is no pleural effusion or pneumothorax. The cardiac silhouette is within normal limits given patient's rotation. Old healed right posterior rib fracture. No acute fracture. IMPRESSION: No active disease. Electronically Signed   By: Anner Crete M.D.   On: 09/13/2016 21:59    Assessment/Plan  1. Acute bronchitis, unspecified organism Improved  Continue xopenex and atrovent q 6 hrs Needs to transition to hand held inhalers over the next 1-2  days and then should be discharged (see below) Continue Doxycycline 100 mg bid to complete 7 days  2. Weakness Improved Independent in all ADL's  3. Hypokalemia Received Kdur 20 meq x 3 BMP pending  4. Essential hypertension Controlled Continue metoprolol 25 mg BID  5. Chronic atrial fibrillation (HCC) Controlled rate CVA risk reduction with Xarelto 20 mg qd  6. Hypothyroidism, unspecified type Continue synthroid 50 mcg qd  7. HYPERCHOLESTEROLEMIA Continue atorvastatin lipitor 10 mg qd  8. Hyperglycemia Noted on BMP but had received Depo medrol and was acutely ill, will need f/u out pt  9. Chronic diastolic congestive heart failure (HCC) Edema improved per pt Continues on lasix 40 mg qd and 20 mg in the evening as needed Keep legs elevated   Family/ staff Communication: discussed with resident.  She is medically stable for discharge but would like to spend 1-2 more days in rehab to regain strength so that she can better care for her husband at home. She would also like to continue to receive breathing treatments.  Labs/tests ordered:  BMP and MG pending

## 2016-09-17 ENCOUNTER — Encounter: Payer: Self-pay | Admitting: Internal Medicine

## 2016-09-17 ENCOUNTER — Non-Acute Institutional Stay (SKILLED_NURSING_FACILITY): Payer: Medicare Other | Admitting: Internal Medicine

## 2016-09-17 DIAGNOSIS — R634 Abnormal weight loss: Secondary | ICD-10-CM | POA: Diagnosis not present

## 2016-09-17 DIAGNOSIS — R0789 Other chest pain: Secondary | ICD-10-CM

## 2016-09-17 DIAGNOSIS — I10 Essential (primary) hypertension: Secondary | ICD-10-CM | POA: Diagnosis not present

## 2016-09-17 DIAGNOSIS — R531 Weakness: Secondary | ICD-10-CM

## 2016-09-17 DIAGNOSIS — J209 Acute bronchitis, unspecified: Secondary | ICD-10-CM | POA: Diagnosis not present

## 2016-09-17 DIAGNOSIS — E039 Hypothyroidism, unspecified: Secondary | ICD-10-CM

## 2016-09-17 DIAGNOSIS — I482 Chronic atrial fibrillation, unspecified: Secondary | ICD-10-CM

## 2016-09-17 DIAGNOSIS — J4521 Mild intermittent asthma with (acute) exacerbation: Secondary | ICD-10-CM | POA: Diagnosis not present

## 2016-09-17 NOTE — Progress Notes (Signed)
Patient ID: Cheyenne Cruz, female   DOB: 1928-12-10, 83 y.o.   MRN: [id number]  Location:  [place] Room Number: 146 rehab Place of Service:  SNF (31) Provider:  Marlen Mollica L. Mariea Clonts, D.O., C.M.D.  Hollace Kinnier, DO  Patient Care Team: Gayland Curry, DO as PCP - General (Geriatric Medicine) [facility] Cletis Athens, MD (Inactive) as Consulting Physician (Gastroenterology) Peter M Martinique, MD as Consulting Physician (Cardiology)  Extended Emergency Contact Information Primary Emergency Contact: Lorel Monaco Address: [place], [place] [address] [place] Phone: [phone] Relation: Spouse Secondary Emergency Contact: [place] Phone: [phone] Relation: Daughter  Code Status:  DNR Goals of care: Advanced Directive information Advanced Directives 09/17/2016  Does Patient Have a Medical Advance Directive? Yes  Type of Advance Directive Out of facility DNR (pink MOST or yellow form);Living will;Healthcare Power of Attorney  Does patient want to make changes to medical advance directive? -  Copy of [place] in Chart? Yes  Pre-existing out of facility DNR order (yellow form or pink MOST form) Yellow form placed in chart (order not valid for inpatient use)     Chief Complaint  Patient presents with  . Rehab Admission    HPI:  Pt is a 83 y.o. female seen today for ED f/u and rehab admission (and discharge).  Cheyenne Cruz has a h/o afib, aortic sclerosis, chronic diastolic chf, hypothyroidism, HL, osteoporosis, and asthma.  She reports that she was seen in the [facility] office by NP on 09/03/16 for acute bronchitis.  She was having wheezing, cough, congestion and was prescribed mucinex.  A CXR was done which was clear.  She began to feel better, but then started to get worse again.  Finally on 1/26, she called for help due to chest  pressure, cough, tightness in her chest and she came over to rehab.  Her intake was poor, she was weak.  She underwent an EKG which showed only afib at 85 but the machine printed out "markedly abnormal with anteroseptal MI".  Due to lack of resolution with a steroid injection, initiation of doxycycline therapy (7d started) and neb treatments, she was sent out to the ED.  There, she was diagnosed with "chest wall pain" and COPD exacerbation.  EKG there showed afib and old anteroseptal MI.  CBC showed no leukocytosis.  BMP showed hypokalemia, mild hyponatremia, low ionized calcium.  Troponin I was 0.  CXR showed right healed rib fx and old granuloma in upper lung field.  No acute disease.  Today, she is doing much better after spending 4 days here.  She has been getting nebulizer treatments and would like to continue them at home.  She has gained strength and is back to walking independently again. Her appetite has returned and she reports that she is back to nagging her husband again which must be a sign of improvement.    HTN:  Pt reports bps here have been elevated at times.  BP [id number] systolic and [id number] diastolic.  She has not typically had bps that high at home, she says.    Her weight is down to 186 lbs today after poor intake for a few days (down from 192.9lbs on 09/03/16).  Past Medical History:  Diagnosis Date  . Abnormality of gait 10/09/2008  . Aortic valve sclerosis   . Atrial fibrillation, persistent ([place])   .  Closed fracture of shaft of femur ([place]) 11.14/2012  . Enthesopathy of unspecified site 03/19/2009  . Fatigue   . H/O total hysterectomy   . Hyperlipidemia   . Hypertension   . MVP (mitral valve prolapse)   . Myalgia and myositis, unspecified 08/06/2010  . Nonspecific abnormal results of pulmonary system function study 08/06/2010  . Nontoxic uninodular goiter 08/06/2010  . OA (osteoarthritis)   . Obesity   . Osteoporosis, senile 08/15/2004  . Other abnormal blood chemistry  06/25/2010  . Overweight and obesity(278.0) 11/11/2012  . Pain in joint, ankle and foot 12/28/2008  . Plantar fasciitis   . Shortness of breath    WITH EXERTION  . Thyroid disease   . Unspecified asthma, with exacerbation 12/02/2005  . Unspecified essential hypertension 08/15/2004  . Unspecified hypothyroidism 08/06/2010  . Vitamin D deficiency    Past Surgical History:  Procedure Laterality Date  . ABDOMINAL HYSTERECTOMY  2001   Total Dr. Rexford Maus  . BASAL CELL CARCINOMA EXCISION  2006   Above left side of lip  . CATARACT EXTRACTION W/ INTRAOCULAR LENS  IMPLANT, BILATERAL  08/2015   Dr. Maricela Curet  . COLONOSCOPY  09/27/2008   Dr. Sammuel Cooper normal  . [place]  . [place]  . FRACTURE SURGERY Left 2012   hip ORIF  . SKIN CANCER EXCISION  2003   NOSE  . TONSILLECTOMY      No Known Allergies  Allergies as of 09/17/2016   No Known Allergies     Medication List       Accurate as of 09/17/16 10:37 AM. Always use your most recent med list.          atorvastatin 10 MG tablet Commonly known as:  LIPITOR Take 1 tablet (10 mg total) by mouth daily.   calcium carbonate 600 MG Tabs tablet Commonly known as:  OS-CAL Take 600 mg by mouth. Take one tablet twice daily   doxycycline 100 MG capsule Commonly known as:  VIBRAMYCIN Take 100 mg by mouth 2 (two) times daily. X 7 days   Fish Oil 1000 MG Caps Take 1 capsule by mouth daily. Take 1 capsule once a day.   furosemide 40 MG tablet Commonly known as:  LASIX Take 40 mg in morning and 20 mg ( 1/2 tablet ) in pm if needed   ipratropium 0.02 % nebulizer solution Commonly known as:  ATROVENT Take 0.5 mg by nebulization every 6 (six) hours.   levalbuterol 0.63 MG/3ML nebulizer solution Commonly known as:  XOPENEX Take 0.63 mg by nebulization every 6 (six) hours.   levothyroxine 50 MCG tablet Commonly known as:  SYNTHROID, LEVOTHROID Take 1 tablet (50 mcg total) by mouth daily.   Melatonin  5 MG Tabs Take 5 mg by mouth at bedtime as needed.   metoprolol tartrate 25 MG tablet Commonly known as:  LOPRESSOR Take 1 tablet (25 mg total) by mouth 2 (two) times daily.   multivitamin tablet Take 1 tablet by mouth daily. Take 1 tablet once a day.   rivaroxaban 20 MG Tabs tablet Commonly known as:  XARELTO Take 1 tablet (20 mg total) by mouth daily with supper.   Vitamin D-3 1000 units Caps Take 1 capsule by mouth daily.       Review of Systems  Constitutional: Positive for activity change, appetite change and fatigue. Negative for chills and fever.  HENT: Positive for hearing loss. Negative for congestion.   Eyes: Negative for  visual disturbance.  Respiratory: Positive for cough, shortness of breath and wheezing. Negative for apnea, choking, chest tightness and stridor.   Cardiovascular: Negative for chest pain, palpitations and leg swelling.  Gastrointestinal: Negative for abdominal distention, abdominal pain, constipation, diarrhea, nausea and vomiting.  Genitourinary: Negative for dysuria.  Musculoskeletal: Positive for gait problem.       Unsteady and weak so using walker  Neurological: Positive for weakness.  Hematological: Bruises/bleeds easily.  Psychiatric/Behavioral: Negative for agitation, behavioral problems, confusion, sleep disturbance and suicidal ideas.    Immunization History  Administered Date(s) Administered  . Influenza Whole 04/19/2010, 05/19/2012, 06/18/2013  . Influenza,inj,Quad PF,36+ Mos 06/29/2014  . Influenza-Unspecified 06/08/2015, 04/19/2016  . Pneumococcal Conjugate-13 05/15/2016  . Pneumococcal Polysaccharide-23 05/19/2008  . Td 10/18/2002  . Zoster 07/07/2006   Pertinent  Health Maintenance Due  Topic Date Due  . INFLUENZA VACCINE  Completed  . DEXA SCAN  Completed  . PNA vac Low Risk Adult  Completed   Fall Risk  09/03/2016 08/21/2016 05/15/2016 01/03/2016 10/11/2015  Falls in the past year? No No No No No   Functional Status  Survey:    Vitals:   09/17/16 1024  BP: (!) 149/92  Pulse: 81  Resp: 20  Temp: 97.6 F (36.4 C)  TempSrc: Oral  SpO2: 95%  Weight: 186 lb (84.4 kg)   Body mass index is 34.02 kg/m. Physical Exam  Constitutional: She is oriented to person, place, and time. She appears well-developed and well-nourished. No distress.  HENT:  Head: Normocephalic and atraumatic.  Right Ear: External ear normal.  Left Ear: External ear normal.  Nose: Nose normal.  Mouth/Throat: Oropharynx is clear and moist.  Eyes: Conjunctivae and EOM are normal. Pupils are equal, round, and reactive to light.  Neck: Neck supple. No JVD present. No thyromegaly present.  Cardiovascular: Intact distal pulses.   irreg irreg  Pulmonary/Chest: She has wheezes.  Some coarse rhonchi upper lung fields  Abdominal: Soft. Bowel sounds are normal. She exhibits no distension and no mass. There is no tenderness. There is no rebound and no guarding.  Musculoskeletal: Normal range of motion.  Moving slower than usual and using walker (normally walks unassisted)  Lymphadenopathy:    She has no cervical adenopathy.  Neurological: She is alert and oriented to person, place, and time. No cranial nerve deficit.  Skin: Skin is warm and dry. No pallor.  Psychiatric: She has a normal mood and affect. Her behavior is normal. Judgment and thought content normal.    Labs reviewed:  Recent Labs  05/07/16 0700 09/13/16 2138 09/13/16 2152  NA 140 133* 134*  K 4.3 3.2* 3.3*  CL  --  95* 95*  CO2  --  25  --   GLUCOSE  --  151* 151*  BUN 27* 12 12  CREATININE 1.0 0.81 0.80  CALCIUM  --  7.9*  --    No results for input(s): AST, ALT, ALKPHOS, BILITOT, PROT, ALBUMIN in the last 8760 hours.  Recent Labs  05/07/16 0700 09/13/16 2138 09/13/16 2152  WBC 6.6 6.6  --   NEUTROABS  --  5.2  --   HGB 13.1 12.3 12.2  HCT 43 37.2 36.0  MCV  --  84.7  --   PLT 214 290  --    Lab Results  Component Value Date   TSH 3.63  05/07/2016   Lab Results  Component Value Date   HGBA1C 6.0 05/07/2016   Lab Results  Component Value Date  CHOL 178 05/07/2016   HDL 87 (A) 05/07/2016   LDLCALC 74 05/07/2016   TRIG 85 05/07/2016    Significant Diagnostic Results in last 30 days:  Dg Chest Portable 1 View  Result Date: 09/13/2016 CLINICAL DATA:  83 year old female with COPD and nonproductive cough. EXAM: PORTABLE CHEST 1 VIEW COMPARISON:  Chest radiograph dated 09/03/2016 and CT dated 05/23/2016 FINDINGS: The patient is slightly rotated to the left. The lungs are clear. Left upper lobe calcified granuloma. There is no pleural effusion or pneumothorax. The cardiac silhouette is within normal limits given patient's rotation. Old healed right posterior rib fracture. No acute fracture. IMPRESSION: No active disease. Electronically Signed   By: Anner Crete M.D.   On: 09/13/2016 21:59    Assessment/Plan 1. Chest wall pain -no legitimate EKG abnormalities, due to bronchitis, resolved  2. Mild intermittent asthma with exacerbation -from bronchitis, no pneumonia found, she had oddly been off of any kind of asthma treatment since she was 83 yo, but now had this episode -continue with neb txs at home until her f/u appt with me, then use as needed if she is much better -is much improved from admission and eager to return home later today  3. Acute bronchitis, unspecified organism -much improved, completed abx and had steroid injection -getting scheduled nebs q6h with major help  4. Essential hypertension -bp elevated here at times, but suspect due to illness and steroid treatments--if remains high at return home, will adjust medications  5. Chronic atrial fibrillation (HCC) -stab;e.cont xarelto anticoagulant and lopressor for rate control  6. Hypothyroidism, unspecified type -cont current synthroid Lab Results  Component Value Date   TSH 3.63 05/07/2016   7. Generalized weakness -ongoing, cont use of walker  and PT, rest and gradually get back into home routine, don't overdo at first  8. Weight loss, unintentional -due to infection and sob, cont to monitor as appetite still not back to normal, pt is obese and should lose weight, but not this way  Family/ staff Communication: discussed with pt, husband, rehab nurse  Labs/tests ordered:  No new, f/u with me within 2 weeks at clinic or office

## 2016-09-19 ENCOUNTER — Ambulatory Visit: Payer: Medicare Other | Admitting: Internal Medicine

## 2016-09-20 ENCOUNTER — Telehealth: Payer: Self-pay | Admitting: *Deleted

## 2016-09-20 NOTE — Telephone Encounter (Signed)
I didn't send anything in recently.  She was to be sent home from rehab with enough vials of the nebulizer solution to last her until her f/u appt.  Yes, we can send in atrovent nebulizer solution to her pharmacy today.

## 2016-09-20 NOTE — Telephone Encounter (Signed)
Patient notified and stated that she has enough Neb solution until her appointment.

## 2016-09-20 NOTE — Telephone Encounter (Signed)
Patient called and stated that you had given her an Atrovent HFA Inhaler Inhale every 6 hours. She stated it was an Inhaler not a Nebulizer solution. Would like a refill. It is not in her current medication list. Is this ok to send to [facility]. Please Advise.

## 2016-09-24 NOTE — Progress Notes (Signed)
Cheyenne Cruz Date of Birth: 08/06/1929 Medical Record [id number]  History of Present Illness: Cheyenne Cruz is seen for follow up of atrial fibrillation. She was seen in 2011 with symptoms of dyspnea. She had an Echo at that time that showed Normal LV function, AV sclerosis, and mild MR. A stress test showed very poor exercise tolerance. She had a cardiac cath that showed no CAD and normal right heart pressures.She had pulmonary evaluation at that time with Dr. Gwenette Greet with normal PFTs.  In 2015 she was found to be in atrial fibrillation.  She was asymptomatic.   Started on metoprolol and Xarelto. She later developed findings of diastolic CHF. She was placed on lasix with improvement.   She was seen in the ED on 09/13/16 with dyspnea and concern about an abnormal Ecg. Findings felt to be consistent with COPD exacerbation and she was treated for this. Ecg showed no change from prior. She had a bad case of bronchitis for over a month and has been in the Rehab center at [facility]. She still gets fatigued easily. Her SOB has improved. Minimal cough now. Still using nebulizers. She is sleeping well now.  She has lost 10 lbs with her recent illness. Mild edema in right ankle.    Allergies as of 09/25/2016   No Known Allergies     Medication List       Accurate as of 09/25/16 10:01 AM. Always use your most recent med list.          atorvastatin 10 MG tablet Commonly known as:  LIPITOR Take 1 tablet (10 mg total) by mouth daily.   calcium carbonate 600 MG Tabs tablet Commonly known as:  OS-CAL Take 600 mg by mouth. Take one tablet twice daily   doxycycline 100 MG capsule Commonly known as:  VIBRAMYCIN Take 100 mg by mouth 2 (two) times daily. X 7 days   Fish Oil 1000 MG Caps Take 1 capsule by mouth daily. Take 1 capsule once a day.   furosemide 40 MG tablet Commonly known as:  LASIX Take 40 mg in morning and 20 mg ( 1/2 tablet ) in pm if needed   ipratropium 0.02 % nebulizer  solution Commonly known as:  ATROVENT Take 0.5 mg by nebulization every 6 (six) hours.   levalbuterol 0.63 MG/3ML nebulizer solution Commonly known as:  XOPENEX Take 0.63 mg by nebulization every 6 (six) hours.   levothyroxine 50 MCG tablet Commonly known as:  SYNTHROID, LEVOTHROID Take 1 tablet (50 mcg total) by mouth daily.   Melatonin 5 MG Tabs Take 5 mg by mouth at bedtime as needed.   metoprolol tartrate 25 MG tablet Commonly known as:  LOPRESSOR Take 1 tablet (25 mg total) by mouth 2 (two) times daily.   multivitamin tablet Take 1 tablet by mouth daily. Take 1 tablet once a day.   rivaroxaban 20 MG Tabs tablet Commonly known as:  XARELTO Take 1 tablet (20 mg total) by mouth daily with supper.   Vitamin D-3 1000 units Caps Take 1 capsule by mouth daily.       No Known Allergies  Past Medical History:  Diagnosis Date  . Abnormality of gait 10/09/2008  . Aortic valve sclerosis   . Atrial fibrillation, persistent ([place])   . Closed fracture of shaft of femur ([place]) 11.14/2012  . Enthesopathy of unspecified site 03/19/2009  . Fatigue   . H/O total hysterectomy   . Hyperlipidemia   . Hypertension   . MVP (  mitral valve prolapse)   . Myalgia and myositis, unspecified 08/06/2010  . Nonspecific abnormal results of pulmonary system function study 08/06/2010  . Nontoxic uninodular goiter 08/06/2010  . OA (osteoarthritis)   . Obesity   . Osteoporosis, senile 08/15/2004  . Other abnormal blood chemistry 06/25/2010  . Overweight and obesity(278.0) 11/11/2012  . Pain in joint, ankle and foot 12/28/2008  . Plantar fasciitis   . Shortness of breath    WITH EXERTION  . Thyroid disease   . Unspecified asthma, with exacerbation 12/02/2005  . Unspecified essential hypertension 08/15/2004  . Unspecified hypothyroidism 08/06/2010  . Vitamin D deficiency     Past Surgical History:  Procedure Laterality Date  . ABDOMINAL HYSTERECTOMY  2001   Total Dr. Rexford Maus  . BASAL  CELL CARCINOMA EXCISION  2006   Above left side of lip  . CATARACT EXTRACTION W/ INTRAOCULAR LENS  IMPLANT, BILATERAL  08/2015   Dr. Maricela Curet  . COLONOSCOPY  09/27/2008   Dr. Sammuel Cooper normal  . [place]  . [place]  . FRACTURE SURGERY Left 2012   hip ORIF  . SKIN CANCER EXCISION  2003   NOSE  . TONSILLECTOMY      Social History   Social History  . Marital status: Married    Spouse name: N/A  . Number of children: N/A  . Years of education: N/A   Occupational History  . 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 Smoking status: Never Smoker  . Smokeless tobacco: Never Used  . Alcohol use 0.0 oz/week    3 - 4 Glasses of wine per week     Comment: OCCASIONAL GLASS OF WINE  . Drug use: No  . Sexual activity: Not Currently   Other Topics Concern  . None   Social History Narrative   Married 1954, [name].    Retired Pharmacist, hospital.    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 Spends summers in [place].    No smoking history    drinks minimal amount of alcohol.   Exercise predominantly weight training, walking dog   DNR    Family History  Problem Relation Age of Onset  . Stroke Mother 59    FOLLOWING A BROKEN HIP  . Stroke Father   . Aneurysm Father     Abdominal aortic, repaired    Review of Systems: As noted in HPI.  All other systems were reviewed and are negative.  Physical Exam: BP 120/79   Pulse 76   Ht 5\' 2"  (1.575 m)   Wt 182 lb 9.6 oz (82.8 kg)   BMI 33.40 kg/m  Filed Weights   09/25/16 0934  Weight: 182 lb 9.6 oz (82.8 kg)  GENERAL:  Well appearing obese WF in NAD. HEENT:  PERRL, EOMI, sclera are clear. Oropharynx is clear. NECK:  No jugular venous distention, carotid upstroke brisk and symmetric, no bruits, no thyromegaly or adenopathy LUNGS:  Clear to auscultation bilaterally CHEST:  Unremarkable HEART:  IRRR,  PMI not displaced or  sustained,S1 and S2 within normal limits, no S3, no S4: no clicks, no rubs, no murmurs ABD:  Soft, nontender. BS +, no masses or bruits. No hepatomegaly, no splenomegaly EXT:  2 + pulses throughout, Mild right ankle edema, no cyanosis no clubbing. Bruise  SKIN:  Warm and dry.  No rashes NEURO:  Alert and oriented x 3. Cranial nerves II  through XII intact. PSYCH:  Cognitively intact   LABORATORY DATA: Lab Results  Component Value Date   WBC 6.6 09/13/2016   HGB 12.2 09/13/2016   HCT 36.0 09/13/2016   PLT 290 09/13/2016   GLUCOSE 151 (H) 09/13/2016   CHOL 178 05/07/2016   TRIG 85 05/07/2016   HDL 87 (A) 05/07/2016   LDLCALC 74 05/07/2016   ALT 15 06/13/2015   AST 19 06/13/2015   NA 134 (L) 09/13/2016   K 3.3 (L) 09/13/2016   CL 95 (L) 09/13/2016   CREATININE 0.80 09/13/2016   BUN 12 09/13/2016   CO2 25 09/13/2016   TSH 3.63 05/07/2016   HGBA1C 6.0 05/07/2016     PORTABLE CHEST 1 VIEW  COMPARISON:  Chest radiograph dated 09/03/2016 and CT dated 05/23/2016  FINDINGS: The patient is slightly rotated to the left. The lungs are clear. Left upper lobe calcified granuloma. There is no pleural effusion or pneumothorax. The cardiac silhouette is within normal limits given patient's rotation. Old healed right posterior rib fracture. No acute fracture.  IMPRESSION: No active disease.   Electronically Signed   By: Anner Crete M.D.   On: 09/13/2016 21:59  Assessment / Plan: 1. Atrial fibrillation chronic.  Rate currently well controlled on metoprolol. Ecg reviewed from ED and is unchanged. Anticoagulated with Xarelto with elevated CHAD-vasc score of 4.   2. HTN controlled. On metoprolol and lasix.  3.  Chronic diastolic CHF. She appears to be euvolemic.  Will continue lasix  40 mg daily with extra 20 mg prn.  Normal right and left heart cath in 2011. Recommend sodium restriction. Weight is down 10 lbs with recent illness  4. Hypothyroidism. On replacement.    5. Hypercholesterolemia.   6. Bronchitis with COPD exacerbation. Improving.   I will follow up in 6 months.

## 2016-09-25 ENCOUNTER — Ambulatory Visit (INDEPENDENT_AMBULATORY_CARE_PROVIDER_SITE_OTHER): Payer: Medicare Other | Admitting: Cardiology

## 2016-09-25 ENCOUNTER — Encounter: Payer: Self-pay | Admitting: Cardiology

## 2016-09-25 VITALS — BP 120/79 | HR 76 | Ht 62.0 in | Wt 182.6 lb

## 2016-09-25 DIAGNOSIS — I5032 Chronic diastolic (congestive) heart failure: Secondary | ICD-10-CM

## 2016-09-25 DIAGNOSIS — I482 Chronic atrial fibrillation, unspecified: Secondary | ICD-10-CM

## 2016-09-25 DIAGNOSIS — Z7901 Long term (current) use of anticoagulants: Secondary | ICD-10-CM

## 2016-09-25 DIAGNOSIS — E7800 Pure hypercholesterolemia, unspecified: Secondary | ICD-10-CM | POA: Diagnosis not present

## 2016-09-25 NOTE — Patient Instructions (Signed)
Continue your current therapy  I will see you in 6 months.   

## 2016-10-02 ENCOUNTER — Encounter: Payer: Self-pay | Admitting: Internal Medicine

## 2016-10-02 ENCOUNTER — Non-Acute Institutional Stay: Payer: Medicare Other | Admitting: Internal Medicine

## 2016-10-02 VITALS — BP 110/60 | HR 92 | Temp 97.5°F | Wt 187.0 lb

## 2016-10-02 DIAGNOSIS — J209 Acute bronchitis, unspecified: Secondary | ICD-10-CM

## 2016-10-02 DIAGNOSIS — I482 Chronic atrial fibrillation, unspecified: Secondary | ICD-10-CM

## 2016-10-02 DIAGNOSIS — R5383 Other fatigue: Secondary | ICD-10-CM

## 2016-10-02 DIAGNOSIS — I5032 Chronic diastolic (congestive) heart failure: Secondary | ICD-10-CM | POA: Diagnosis not present

## 2016-10-02 DIAGNOSIS — F419 Anxiety disorder, unspecified: Secondary | ICD-10-CM

## 2016-10-02 DIAGNOSIS — J4521 Mild intermittent asthma with (acute) exacerbation: Secondary | ICD-10-CM

## 2016-10-02 NOTE — Progress Notes (Signed)
Location:  [facility] clinic Provider: Paxtyn Boyar L. Mariea Clonts, D.O., C.M.D.  Code Status: DNR Goals of Care:  Advanced Directives 10/02/2016  Does Patient Have a Medical Advance Directive? Yes  Type of Paramedic of [place];Out of facility DNR (pink MOST or yellow form)  Does patient want to make changes to medical advance directive? -  Copy of [place] in Chart? Yes  Pre-existing out of facility DNR order (yellow form or pink MOST form) Yellow form placed in chart (order not valid for inpatient use)     Chief Complaint  Patient presents with  . Follow-up    discharge from rehab    HPI: Patient is a 83 y.o. female seen today for rehab follow-up s/p admission from 1/26-1/30/18.  Still sob and using walker to get around.  She is more hoarse from the inhaler.  Cough better--a few days ago is when it stopped.  She is eating small meals, but not more frequently.  She is trying to make a salad a true meal.  She is slugging the water down.  Weight back up to 187.    Past Medical History:  Diagnosis Date  . Abnormality of gait 10/09/2008  . Aortic valve sclerosis   . Atrial fibrillation, persistent ([place])   . Closed fracture of shaft of femur ([place]) 11.14/2012  . Enthesopathy of unspecified site 03/19/2009  . Fatigue   . H/O total hysterectomy   . Hyperlipidemia   . Hypertension   . MVP (mitral valve prolapse)   . Myalgia and myositis, unspecified 08/06/2010  . Nonspecific abnormal results of pulmonary system function study 08/06/2010  . Nontoxic uninodular goiter 08/06/2010  . OA (osteoarthritis)   . Obesity   . Osteoporosis, senile 08/15/2004  . Other abnormal blood chemistry 06/25/2010  . Overweight and obesity(278.0) 11/11/2012  . Pain in joint, ankle and foot 12/28/2008  . Plantar fasciitis   . Shortness of breath    WITH EXERTION  . Thyroid disease   . Unspecified asthma, with exacerbation 12/02/2005  . Unspecified essential hypertension  08/15/2004  . Unspecified hypothyroidism 08/06/2010  . Vitamin D deficiency     Past Surgical History:  Procedure Laterality Date  . ABDOMINAL HYSTERECTOMY  2001   Total Dr. Rexford Maus  . BASAL CELL CARCINOMA EXCISION  2006   Above left side of lip  . CATARACT EXTRACTION W/ INTRAOCULAR LENS  IMPLANT, BILATERAL  08/2015   Dr. Maricela Curet  . COLONOSCOPY  09/27/2008   Dr. Sammuel Cooper normal  . [place]  . [place]  . FRACTURE SURGERY Left 2012   hip ORIF  . SKIN CANCER EXCISION  2003   NOSE  . TONSILLECTOMY      No Known Allergies  Allergies as of 10/02/2016   No Known Allergies     Medication List       Accurate as of 10/02/16 11:57 AM. Always use your most recent med list.          atorvastatin 10 MG tablet Commonly known as:  LIPITOR Take 1 tablet (10 mg total) by mouth daily.   calcium carbonate 600 MG Tabs tablet Commonly known as:  OS-CAL Take 600 mg by mouth. Take one tablet twice daily   Fish Oil 1000 MG Caps Take 1 capsule by mouth daily. Take 1 capsule once a day.   furosemide 40 MG tablet Commonly known as:  LASIX Take 40 mg in morning and 20 mg (  1/2 tablet ) in pm if needed   ipratropium 0.02 % nebulizer solution Commonly known as:  ATROVENT Take 0.5 mg by nebulization every 6 (six) hours.   levalbuterol 0.63 MG/3ML nebulizer solution Commonly known as:  XOPENEX Take 0.63 mg by nebulization every 6 (six) hours.   levothyroxine 50 MCG tablet Commonly known as:  SYNTHROID, LEVOTHROID Take 1 tablet (50 mcg total) by mouth daily.   metoprolol tartrate 25 MG tablet Commonly known as:  LOPRESSOR Take 1 tablet (25 mg total) by mouth 2 (two) times daily.   multivitamin tablet Take 1 tablet by mouth daily. Take 1 tablet once a day.   rivaroxaban 20 MG Tabs tablet Commonly known as:  XARELTO Take 1 tablet (20 mg total) by mouth daily with supper.   Vitamin D-3 1000 units Caps Take 1 capsule by mouth daily.        Review of Systems:  Review of Systems  Constitutional: Negative for chills, fever and malaise/fatigue.  HENT: Negative for hearing loss.   Eyes: Negative for blurred vision.  Respiratory: Positive for shortness of breath. Negative for cough, sputum production and wheezing.   Cardiovascular: Negative for chest pain, palpitations and leg swelling.  Gastrointestinal: Negative for abdominal pain, blood in stool, constipation and melena.  Genitourinary: Negative for dysuria.  Musculoskeletal: Negative for falls and myalgias.       Walking with walker  Skin: Negative for itching and rash.  Neurological: Negative for dizziness, loss of consciousness and weakness.  Endo/Heme/Allergies: Bruises/bleeds easily.  Psychiatric/Behavioral: Positive for memory loss. Negative for depression. The patient is nervous/anxious. The patient does not have insomnia.        Reports some memory loss also    Health Maintenance  Topic Date Due  . TETANUS/TDAP  10/17/2012  . INFLUENZA VACCINE  Completed  . DEXA SCAN  Completed  . ZOSTAVAX  Completed  . PNA vac Low Risk Adult  Completed    Physical Exam: Vitals:   10/02/16 1133  BP: 110/60  Pulse: 92  Temp: 97.5 F (36.4 C)  TempSrc: Oral  SpO2: 96%  Weight: 187 lb (84.8 kg)   Body mass index is 34.2 kg/m. Physical Exam  Constitutional: She is oriented to person, place, and time. She appears well-developed and well-nourished. No distress.  Cardiovascular: Intact distal pulses.   irreg irreg  Pulmonary/Chest: Effort normal and breath sounds normal. She has no wheezes. She has no rales.  Abdominal: Soft. Bowel sounds are normal. She exhibits no distension and no mass. There is no tenderness. There is no rebound and no guarding. No hernia.  Musculoskeletal: Normal range of motion.  Walking with rolling walker with skis for support  Neurological: She is alert and oriented to person, place, and time.  Skin: Skin is warm and dry. Capillary refill  takes less than 2 seconds.  Psychiatric: She has a normal mood and affect. Her behavior is normal. Judgment and thought content normal.    Labs reviewed: Basic Metabolic Panel:  Recent Labs  12/26/15 05/07/16 0700 09/13/16 2138 09/13/16 2152  NA  --  140 133* 134*  K  --  4.3 3.2* 3.3*  CL  --   --  95* 95*  CO2  --   --  25  --   GLUCOSE  --   --  151* 151*  BUN  --  27* 12 12  CREATININE  --  1.0 0.81 0.80  CALCIUM  --   --  7.9*  --  TSH 4.56 3.63  --   --    Liver Function Tests: No results for input(s): AST, ALT, ALKPHOS, BILITOT, PROT, ALBUMIN in the last 8760 hours. No results for input(s): LIPASE, AMYLASE in the last 8760 hours. No results for input(s): AMMONIA in the last 8760 hours. CBC:  Recent Labs  05/07/16 0700 09/13/16 2138 09/13/16 2152  WBC 6.6 6.6  --   NEUTROABS  --  5.2  --   HGB 13.1 12.3 12.2  HCT 43 37.2 36.0  MCV  --  84.7  --   PLT 214 290  --    Lipid Panel:  Recent Labs  05/07/16 0700  CHOL 178  HDL 87*  LDLCALC 74  TRIG 85   Lab Results  Component Value Date   HGBA1C 6.0 05/07/2016    Procedures since last visit: Dg Chest Portable 1 View  Result Date: 09/13/2016 CLINICAL DATA:  83 year old female with COPD and nonproductive cough. EXAM: PORTABLE CHEST 1 VIEW COMPARISON:  Chest radiograph dated 09/03/2016 and CT dated 05/23/2016 FINDINGS: The patient is slightly rotated to the left. The lungs are clear. Left upper lobe calcified granuloma. There is no pleural effusion or pneumothorax. The cardiac silhouette is within normal limits given patient's rotation. Old healed right posterior rib fracture. No acute fracture. IMPRESSION: No active disease. Electronically Signed   By: Anner Crete M.D.   On: 09/13/2016 21:59   Assessment/Plan 1. Acute bronchitis, unspecified organism -completed abx and just finished nebs today -still fatigued and weak, dyspneic moreso on exertion and using walker, but lungs clear  2. Mild  intermittent asthma with acute exacerbation -suspect she does have this underlying condition, but it's not in her medical record -she reports having used an inhaler until she was about 83 yo, but then not again until this bronchitis episode  3. Fatigue, unspecified type -due to recent illness, gradually improving  4. Chronic atrial fibrillation (HCC) -stable, follows with Dr. Martinique and was just seen; cont xarelto, lopressor for rate control  5. Chronic diastolic congestive heart failure (HCC) -stable, cont lasix 40mg  daily with prn 20mg  for weight weight, edema  6. Anxiety -concerned about her husband whose cognitive status is worsening, was concerned and tearful, may need an antidepressant in the future to help with this adjustment--her daughter is very supportive  Pt also to get PPD read by clinic nurse today. To call [facility] to schedule her bone density in another week or so when she feels up to going  Labs/tests ordered:  No orders of the defined types were placed in this encounter.  Next appt:  12/25/2016  Sanaai Doane L. Elizer Bostic, D.O. [facility] [address], [place] [address] Cell Phone (Mon-Fri 8am-5pm):  [phone] On Call:  [phone] & follow prompts after 5pm & weekends Office Phone:  [phone] Office Fax:  [phone]

## 2016-10-13 DIAGNOSIS — J4520 Mild intermittent asthma, uncomplicated: Secondary | ICD-10-CM | POA: Insufficient documentation

## 2016-10-14 ENCOUNTER — Telehealth: Payer: Self-pay | Admitting: *Deleted

## 2016-10-14 NOTE — Telephone Encounter (Signed)
Message left on Nancy's voicemail with brief synopsis of my thoughts that her mom has underlying asthma that has not been a problem for many years but now may require more treatment.  She may need a pulmonary referral.  I requested that she call [facility] tomorrow when I am over there and ask to speak with me.

## 2016-10-14 NOTE — Telephone Encounter (Signed)
Patient daughter, Izora Gala called and stated that patient has been having trouble breathing for a month now. Getting worse and having trouble catching her breath. Does not want to take her to the Urgent Care or ER. Daughter wants to speak with you directly because she stated that this has been going on too long. Daughter stated that she wants you to call her at [phone]. Daughter wants to speak with you directly.

## 2016-10-15 NOTE — Telephone Encounter (Signed)
Cheyenne Cruz has concerns about her mom's shortness of breath, fatigue, and sadness.  She had a lot of difficulty getting out of the car to get to the barbecue her sister had.  She was looking worn out and exhausted.  Apparently, she's been feeling "awful".  She had told me her appetite had not returned, but did not c/o sob, coughing.  She does have fatigue and admitted to sadness about her husband's memory loss.  Pt did not express to me herself how bad she felt.  Cheyenne Cruz is going to be coming along to her mom's appt on Friday at 10am at [facility].

## 2016-10-18 ENCOUNTER — Encounter: Payer: Self-pay | Admitting: Internal Medicine

## 2016-10-18 ENCOUNTER — Ambulatory Visit (INDEPENDENT_AMBULATORY_CARE_PROVIDER_SITE_OTHER): Payer: Medicare Other | Admitting: Internal Medicine

## 2016-10-18 VITALS — BP 118/66 | HR 86 | Temp 97.4°F | Ht 62.0 in | Wt 190.0 lb

## 2016-10-18 DIAGNOSIS — F419 Anxiety disorder, unspecified: Secondary | ICD-10-CM | POA: Diagnosis not present

## 2016-10-18 DIAGNOSIS — J4520 Mild intermittent asthma, uncomplicated: Secondary | ICD-10-CM | POA: Diagnosis not present

## 2016-10-18 DIAGNOSIS — I5033 Acute on chronic diastolic (congestive) heart failure: Secondary | ICD-10-CM | POA: Diagnosis not present

## 2016-10-18 LAB — BASIC METABOLIC PANEL
BUN: 18 mg/dL (ref 7–25)
CO2: 32 mmol/L — ABNORMAL HIGH (ref 20–31)
Calcium: 8.9 mg/dL (ref 8.6–10.4)
Chloride: 98 mmol/L (ref 98–110)
Creat: 0.84 mg/dL (ref 0.60–0.88)
Glucose, Bld: 110 mg/dL — ABNORMAL HIGH (ref 65–99)
Potassium: 4.4 mmol/L (ref 3.5–5.3)
Sodium: 139 mmol/L (ref 135–146)

## 2016-10-18 MED ORDER — FUROSEMIDE 40 MG PO TABS: ORAL_TABLET | ORAL | 3 refills | Status: DC

## 2016-10-18 MED ORDER — FUROSEMIDE 40 MG PO TABS: ORAL_TABLET | ORAL | 0 refills | Status: DC

## 2016-10-18 MED ORDER — LEVALBUTEROL HCL 0.63 MG/3ML IN NEBU: 0.6300 mg | INHALATION_SOLUTION | Freq: Two times a day (BID) | RESPIRATORY_TRACT | 5 refills | Status: DC

## 2016-10-18 NOTE — Progress Notes (Signed)
Location:  [facility] clinic Provider: Zania Kalisz L. Mariea Clonts, D.O., C.M.D.  Code Status: DNR Goals of Care:  Advanced Directives 10/02/2016  Does Patient Have a Medical Advance Directive? Yes  Type of Paramedic of [place];Out of facility DNR (pink MOST or yellow form)  Does patient want to make changes to medical advance directive? -  Copy of [place] in Chart? Yes  Pre-existing out of facility DNR order (yellow form or pink MOST form) Yellow form placed in chart (order not valid for inpatient use)   Chief Complaint  Patient presents with  . Acute Visit    Patient c/o breathing problems, fatigue  and congestion x 2 months. Here with Izora Gala (daughter)   . Medication Refill    No refills needed     HPI: Patient is a 83 y.o. female seen today for an acute visit for breathing problems, fatigue and congestion since mid-January.  She first saw Janett Billow here in clinic with cough and congestion and was started on treatment for bronchitis with mucinex and tylenol and cxr was done.  CXR showed no active cardiopulmonary disease. She got better and then worse again.  She deteriorated and was sent to ED from [facility] on 1/26 where they said she had a COPD exacerbation.  She was treated with doxycycline and completed 7 more days at [facility] rehab, xopenex and atrovent nebs q 6 h, and then transition to inhalers.  She was slowly recovering and d/cd home on 09/17/16 with a neb machine and continued nebs.  She then was seen 2/14 in f/u in clinic.  Was still improving, but sob and c/o poor appetite and ongoing weight loss.  Advised she could use the nebs if needed for wheezing and sob.  Lungs were clear.  No wheezing.  Then I received a call 2/26 from her daughter, Izora Gala, very upset that her mother is not getting good care and that she is very ill.    She is having trouble breathing, no energy, loss of appetite, not sleeping well. Overall felt like a dog.  She is having trouble navigating.   Having to hang on to something or use a walker.  Feels like she's not making progress.  She's having trouble doing everyday things like showering, getting in and out of the car, making breakfast.  Continues to have raspy voice.  BP is actually running lower also the past couple of visits.  Usually running 130s.  Feels like the tension of not being able to breathe is causing her to have difficulty sleeping.  Only can sleep if the TV show is really bad.  Also down in the dumps.  She has skipped events she'd like to go to including a funeral.  Not her normal spunk.  She sits leaning over when she cannot breathe.   She has grossly swollen feet and has gained 3 lbs since her last appt, had gone up 7 lbs from discharge from rehab.    Past Medical History:  Diagnosis Date  . Abnormality of gait 10/09/2008  . Aortic valve sclerosis   . Atrial fibrillation, persistent ([place])   . Closed fracture of shaft of femur ([place]) 11.14/2012  . Enthesopathy of unspecified site 03/19/2009  . Fatigue   . H/O total hysterectomy   . Hyperlipidemia   . Hypertension   . MVP (mitral valve prolapse)   . Myalgia and myositis, unspecified 08/06/2010  . Nonspecific abnormal results of pulmonary system function study 08/06/2010  . Nontoxic uninodular  goiter 08/06/2010  . OA (osteoarthritis)   . Obesity   . Osteoporosis, senile 08/15/2004  . Other abnormal blood chemistry 06/25/2010  . Overweight and obesity(278.0) 11/11/2012  . Pain in joint, ankle and foot 12/28/2008  . Plantar fasciitis   . Shortness of breath    WITH EXERTION  . Thyroid disease   . Unspecified asthma, with exacerbation 12/02/2005  . Unspecified essential hypertension 08/15/2004  . Unspecified hypothyroidism 08/06/2010  . Vitamin D deficiency     Past Surgical History:  Procedure Laterality Date  . ABDOMINAL HYSTERECTOMY  2001   Total Dr. Rexford Maus  . BASAL CELL CARCINOMA EXCISION  2006   Above left side of lip  . CATARACT EXTRACTION W/  INTRAOCULAR LENS  IMPLANT, BILATERAL  08/2015   Dr. Maricela Curet  . COLONOSCOPY  09/27/2008   Dr. Sammuel Cooper normal  . [place]  . [place]  . FRACTURE SURGERY Left 2012   hip ORIF  . SKIN CANCER EXCISION  2003   NOSE  . TONSILLECTOMY      No Known Allergies  Allergies as of 10/18/2016   No Known Allergies     Medication List       Accurate as of 10/18/16 10:25 AM. Always use your most recent med list.          atorvastatin 10 MG tablet Commonly known as:  LIPITOR Take 1 tablet (10 mg total) by mouth daily.   calcium carbonate 600 MG Tabs tablet Commonly known as:  OS-CAL Take 600 mg by mouth. Take one tablet twice daily   Fish Oil 1000 MG Caps Take 1 capsule by mouth daily. Take 1 capsule once a day.   furosemide 40 MG tablet Commonly known as:  LASIX Take 40 mg in morning and 20 mg ( 1/2 tablet ) in pm if needed   levothyroxine 50 MCG tablet Commonly known as:  SYNTHROID, LEVOTHROID Take 1 tablet (50 mcg total) by mouth daily.   metoprolol tartrate 25 MG tablet Commonly known as:  LOPRESSOR Take 1 tablet (25 mg total) by mouth 2 (two) times daily.   multivitamin tablet Take 1 tablet by mouth daily. Take 1 tablet once a day.   rivaroxaban 20 MG Tabs tablet Commonly known as:  XARELTO Take 1 tablet (20 mg total) by mouth daily with supper.   Vitamin D-3 1000 units Caps Take 1 capsule by mouth daily.       Review of Systems:  Review of Systems  Constitutional: Negative for chills, fever and malaise/fatigue.  HENT: Negative for congestion.        Hoarseness  Respiratory: Positive for cough and shortness of breath. Negative for hemoptysis, sputum production, wheezing and stridor.   Cardiovascular: Positive for orthopnea, leg swelling and PND. Negative for chest pain, palpitations and claudication.  Gastrointestinal: Negative for abdominal pain.  Genitourinary: Positive for frequency and urgency. Negative for dysuria.    Musculoskeletal: Negative for falls.  Skin: Negative for itching and rash.  Neurological: Negative for weakness.       Unsteady and weak  Endo/Heme/Allergies: Bruises/bleeds easily.  Psychiatric/Behavioral: Negative for memory loss. The patient is nervous/anxious and has insomnia.     Health Maintenance  Topic Date Due  . TETANUS/TDAP  10/17/2012  . INFLUENZA VACCINE  Completed  . DEXA SCAN  Completed  . PNA vac Low Risk Adult  Completed    Physical Exam: Vitals:   10/18/16 1005  BP: 118/66  Pulse:  86  Temp: 97.4 F (36.3 C)  TempSrc: Oral  SpO2: 93%  Weight: 190 lb (86.2 kg)  Height: 5\' 2"  (1.575 m)   Body mass index is 34.75 kg/m. Physical Exam  Constitutional: She is oriented to person, place, and time. She appears well-developed and well-nourished. She appears distressed.  Dyspneic on exertion, not using walker today  HENT:  Head: Normocephalic and atraumatic.  Cardiovascular:  irreg irreg; 3+ pitting edema bilateral feet and lower legs, no leakage  Pulmonary/Chest: She has no wheezes.  Dyspneic on exertion; diminished breath sounds at bases  Abdominal: Soft. Bowel sounds are normal. She exhibits no distension. There is no tenderness.  Musculoskeletal: Normal range of motion.  Stooped posture, not using the walker today as she had been, came with her daughter, Izora Gala  Neurological: She is alert and oriented to person, place, and time.  Skin: Skin is warm and dry. There is pallor.  Psychiatric: She has a normal mood and affect.    Labs reviewed: Basic Metabolic Panel:  Recent Labs  12/26/15 05/07/16 0700 09/13/16 2138 09/13/16 2152  NA  --  140 133* 134*  K  --  4.3 3.2* 3.3*  CL  --   --  95* 95*  CO2  --   --  25  --   GLUCOSE  --   --  151* 151*  BUN  --  27* 12 12  CREATININE  --  1.0 0.81 0.80  CALCIUM  --   --  7.9*  --   TSH 4.56 3.63  --   --    Liver Function Tests: No results for input(s): AST, ALT, ALKPHOS, BILITOT, PROT, ALBUMIN in the  last 8760 hours. No results for input(s): LIPASE, AMYLASE in the last 8760 hours. No results for input(s): AMMONIA in the last 8760 hours. CBC:  Recent Labs  05/07/16 0700 09/13/16 2138 09/13/16 2152  WBC 6.6 6.6  --   NEUTROABS  --  5.2  --   HGB 13.1 12.3 12.2  HCT 43 37.2 36.0  MCV  --  84.7  --   PLT 214 290  --    Lipid Panel:  Recent Labs  05/07/16 0700  CHOL 178  HDL 87*  LDLCALC 74  TRIG 85   Lab Results  Component Value Date   HGBA1C 6.0 05/07/2016    Assessment/Plan 1. Acute on chronic diastolic heart failure (HCC) - Basic metabolic panel done today -increase lasix to 40mg  po bid through the weekend -see cardiology Monday, Dr. Doug Sou office for further instructions---I'd like her to have a repeat echocardiogram to reassess when she sees cardiology -weigh daily and notify us if weight up 3 lbs in 24h or 5 lbs in 1 week -may need alternative diuretic therapy if increased lasix does not work or possibly admission for IV diuresis -this does not seem to be asthma any longer -discussed with daughter that pt had in fact improved when Dr. Martinique and when I saw her and she did not have this much edema (weight had trended up but all different scales and I thought she'd improved her eating, but she apparently that was not really true) -she is clearly [facility] more sob, edematous and has decreased breath sounds, sats 93 down from 96 on 2/14 -follow low sodium diet and do not overdo hydration (daughter asked about juicing to help this problem and advised against due to that being even more fluid) -? Did she have a cardiac event in the midst of  her exacerbation or since  2. Mild intermittent asthma without complication -did have recent exacerbation with an acute bronchitis which has since resolved but happened just before this CHF exacerbation  -resume xopenex in am and afternoon, then will plan to use inhalers for her when she is recovered from this CHF exacerbation and get  her in for further pulmonary evaluation/PFTs  3. Anxiety -is concerned and down due to her own decline and her husband's problems right now  Labs/tests ordered:   Orders Placed This Encounter  Procedures  . Basic metabolic panel   Next appt:  12/25/2016    Harlee Eckroth L. Jayden Rudge, D.O. [facility] [address], [place] [address] Cell Phone (Mon-Fri 8am-5pm):  [phone] On Call:  [phone] & follow prompts after 5pm & weekends Office Phone:  [phone] Office Fax:  [phone]

## 2016-10-18 NOTE — Patient Instructions (Addendum)
  Use your nebulizer in the morning and afternoon to help your breathing.     Take lasix 40mg  twice a day (at breakfast and lunch). Weigh yourself daily first thing in the morning. Notify me if weight trends up 3 pounds in 1 day or 5 pounds in 1 week We will set you up for a visit with cardiology asap for an ultrasound and any additional recommendations.

## 2016-10-18 NOTE — Addendum Note (Signed)
Addended by: Logan Bores on: 10/18/2016 01:47 PM   Modules accepted: Orders

## 2016-10-18 NOTE — Progress Notes (Signed)
Cheyenne Cruz Date of Birth: 1929-02-05 Medical Record [id number]  History of Present Illness: Cheyenne Cruz is seen for follow up of atrial fibrillation. She was seen in 2011 with symptoms of dyspnea. She had an Echo at that time that showed Normal LV function, AV sclerosis, and mild MR. A stress test showed very poor exercise tolerance. She had a cardiac cath that showed no CAD and normal right heart pressures.She had pulmonary evaluation at that time with Dr. Gwenette Greet with normal PFTs.  In 2015 she was found to be in atrial fibrillation.  She was asymptomatic.   Started on metoprolol and Xarelto. She later developed findings of diastolic CHF. She was placed on lasix with improvement.   She was seen in the ED on 09/13/16 with dyspnea and concern about an abnormal Ecg. Findings felt to be consistent with COPD exacerbation and she was treated for this. Ecg showed no change from prior. She had a bad case of bronchitis for over a month and has been in the Rehab center at [facility]. She was seen by me February 7 and her SOB had improved.   She had lost 10 lbs with her recent illness. Mild edema in right ankle noted.  She was seen by Dr Mariea Clonts on 10/18/16 and noted to have weight gain, increased edema and marked increase in SOB. Lasix increased to 40 mg bid and follow up scheduled with me today. She notes that she cannot do anything now due to SOB and marked fatigue. No cough or chest pain. Marked edema. Little change since Friday but weight is down 3 lbs.    Allergies as of 10/21/2016   No Known Allergies     Medication List       Accurate as of 10/21/16 12:31 PM. Always use your most recent med list.          atorvastatin 10 MG tablet Commonly known as:  LIPITOR Take 1 tablet (10 mg total) by mouth daily.   calcium carbonate 600 MG Tabs tablet Commonly known as:  OS-CAL Take 600 mg by mouth. Take one tablet twice daily   Fish Oil 1000 MG Caps Take 1 capsule by mouth daily. Take 1 capsule  once a day.   furosemide 40 MG tablet Commonly known as:  LASIX Take 40 mg in morning and 40mg  at noon time   levalbuterol 0.63 MG/3ML nebulizer solution Commonly known as:  XOPENEX Take 3 mLs (0.63 mg total) by nebulization 2 (two) times daily. Use one time in the morning and one time in the evening DX J45.21   levothyroxine 50 MCG tablet Commonly known as:  SYNTHROID, LEVOTHROID Take 1 tablet (50 mcg total) by mouth daily.   metoprolol tartrate 25 MG tablet Commonly known as:  LOPRESSOR Take 1 tablet (25 mg total) by mouth 2 (two) times daily.   multivitamin tablet Take 1 tablet by mouth daily. Take 1 tablet once a day.   potassium chloride SA 20 MEQ tablet Commonly known as:  K-DUR,KLOR-CON Take 1 tablet (20 mEq total) by mouth 2 (two) times daily.   rivaroxaban 20 MG Tabs tablet Commonly known as:  XARELTO Take 1 tablet (20 mg total) by mouth daily with supper.   Vitamin D-3 1000 units Caps Take 1 capsule by mouth daily.       No Known Allergies  Past Medical History:  Diagnosis Date  . Abnormality of gait 10/09/2008  . Aortic valve sclerosis   . Atrial fibrillation, persistent ([place])   .  Closed fracture of shaft of femur ([place]) 11.14/2012  . Enthesopathy of unspecified site 03/19/2009  . Fatigue   . H/O total hysterectomy   . Hyperlipidemia   . Hypertension   . MVP (mitral valve prolapse)   . Myalgia and myositis, unspecified 08/06/2010  . Nonspecific abnormal results of pulmonary system function study 08/06/2010  . Nontoxic uninodular goiter 08/06/2010  . OA (osteoarthritis)   . Obesity   . Osteoporosis, senile 08/15/2004  . Other abnormal blood chemistry 06/25/2010  . Overweight and obesity(278.0) 11/11/2012  . Pain in joint, ankle and foot 12/28/2008  . Plantar fasciitis   . Shortness of breath    WITH EXERTION  . Thyroid disease   . Unspecified asthma, with exacerbation 12/02/2005  . Unspecified essential hypertension 08/15/2004  . Unspecified  hypothyroidism 08/06/2010  . Vitamin D deficiency     Past Surgical History:  Procedure Laterality Date  . ABDOMINAL HYSTERECTOMY  2001   Total Dr. Rexford Maus  . BASAL CELL CARCINOMA EXCISION  2006   Above left side of lip  . CATARACT EXTRACTION W/ INTRAOCULAR LENS  IMPLANT, BILATERAL  08/2015   Dr. Maricela Curet  . COLONOSCOPY  09/27/2008   Dr. Sammuel Cooper normal  . [place]  . [place]  . FRACTURE SURGERY Left 2012   hip ORIF  . SKIN CANCER EXCISION  2003   NOSE  . TONSILLECTOMY      Social History   Social History  . Marital status: Married    Spouse name: N/A  . Number of children: N/A  . Years of education: N/A   Occupational History  . 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 Smoking status: Never Smoker  . Smokeless tobacco: Never Used  . Alcohol use 0.0 oz/week    3 - 4 Glasses of wine per week     Comment: OCCASIONAL GLASS OF WINE  . Drug use: No  . Sexual activity: Not Currently   Other Topics Concern  . None   Social History Narrative   Married 1954, [name].    Retired Pharmacist, hospital.    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 Spends summers in [place].    No smoking history    drinks minimal amount of alcohol.   Exercise predominantly weight training, walking dog   DNR    Family History  Problem Relation Age of Onset  . Stroke Mother 81    FOLLOWING A BROKEN HIP  . Stroke Father   . Aneurysm Father     Abdominal aortic, repaired    Review of Systems: As noted in HPI.  All other systems were reviewed and are negative.  Physical Exam: BP 120/82   Pulse 92   Ht 5\' 2"  (1.575 m)   Wt 187 lb 9.6 oz (85.1 kg)   BMI 34.31 kg/m  Filed Weights   10/21/16 1153  Weight: 187 lb 9.6 oz (85.1 kg)  GENERAL:  Well appearing obese WF in NAD. HEENT:  PERRL, EOMI, sclera are clear. Oropharynx is clear. NECK:  No jugular venous distention, carotid  upstroke brisk and symmetric, no bruits, no thyromegaly or adenopathy LUNGS:  Clear to auscultation bilaterally CHEST:  Unremarkable HEART:  IRRR,  PMI not displaced or sustained,S1 and S2 within normal limits, no S3, no S4: no clicks, no rubs, no murmurs ABD:  Soft, nontender. BS +, no masses or bruits. No hepatomegaly,  no splenomegaly EXT:  2 + pulses throughout, 2-3+ bilateral edema, no cyanosis no clubbing.  SKIN:  Warm and dry.  No rashes NEURO:  Alert and oriented x 3. Cranial nerves II through XII intact. PSYCH:  Cognitively intact   LABORATORY DATA: Lab Results  Component Value Date   WBC 6.6 09/13/2016   HGB 12.2 09/13/2016   HCT 36.0 09/13/2016   PLT 290 09/13/2016   GLUCOSE 110 (H) 10/18/2016   CHOL 178 05/07/2016   TRIG 85 05/07/2016   HDL 87 (A) 05/07/2016   LDLCALC 74 05/07/2016   ALT 15 06/13/2015   AST 19 06/13/2015   NA 139 10/18/2016   K 4.4 10/18/2016   CL 98 10/18/2016   CREATININE 0.84 10/18/2016   BUN 18 10/18/2016   CO2 32 (H) 10/18/2016   TSH 3.63 05/07/2016   HGBA1C 6.0 05/07/2016     PORTABLE CHEST 1 VIEW  COMPARISON:  Chest radiograph dated 09/03/2016 and CT dated 05/23/2016  FINDINGS: The patient is slightly rotated to the left. The lungs are clear. Left upper lobe calcified granuloma. There is no pleural effusion or pneumothorax. The cardiac silhouette is within normal limits given patient's rotation. Old healed right posterior rib fracture. No acute fracture.  IMPRESSION: No active disease.   Electronically Signed   By: Anner Crete M.D.   On: 09/13/2016 21:59  Assessment / Plan: 1. Atrial fibrillation chronic.  Rate currently well controlled on metoprolol.  Anticoagulated with Xarelto with elevated CHAD-vasc score of 4.   2. HTN controlled. On metoprolol and lasix.  3. Acute on Chronic diastolic CHF. She appears to be significantly volume overloaded.  Will increase lasix to 60 mg twice daily. Add potassium 20 meq  twice a day. Update Echo. Follow up in one week with BMET and BNP.  Normal right and left heart cath in 2011. Continue  sodium restriction. Pulmonary embolus is unlikely on anticoagulant therapy.  4. Hypothyroidism. On replacement.   5. Hypercholesterolemia.   6. Bronchitis with COPD exacerbation in January. Improving. I think current limitation more from CHF.  I will follow up in 6 months.

## 2016-10-21 ENCOUNTER — Ambulatory Visit (INDEPENDENT_AMBULATORY_CARE_PROVIDER_SITE_OTHER): Payer: Medicare Other | Admitting: Cardiology

## 2016-10-21 ENCOUNTER — Encounter: Payer: Self-pay | Admitting: Cardiology

## 2016-10-21 ENCOUNTER — Encounter: Payer: Self-pay | Admitting: *Deleted

## 2016-10-21 VITALS — BP 120/82 | HR 92 | Ht 62.0 in | Wt 187.6 lb

## 2016-10-21 DIAGNOSIS — E7800 Pure hypercholesterolemia, unspecified: Secondary | ICD-10-CM

## 2016-10-21 DIAGNOSIS — I5033 Acute on chronic diastolic (congestive) heart failure: Secondary | ICD-10-CM

## 2016-10-21 DIAGNOSIS — Z7901 Long term (current) use of anticoagulants: Secondary | ICD-10-CM | POA: Diagnosis not present

## 2016-10-21 DIAGNOSIS — I482 Chronic atrial fibrillation, unspecified: Secondary | ICD-10-CM

## 2016-10-21 DIAGNOSIS — I10 Essential (primary) hypertension: Secondary | ICD-10-CM | POA: Diagnosis not present

## 2016-10-21 DIAGNOSIS — I110 Hypertensive heart disease with heart failure: Secondary | ICD-10-CM

## 2016-10-21 MED ORDER — POTASSIUM CHLORIDE CRYS ER 20 MEQ PO TBCR: 20.0000 meq | EXTENDED_RELEASE_TABLET | Freq: Two times a day (BID) | ORAL | 3 refills | Status: DC

## 2016-10-21 NOTE — Patient Instructions (Addendum)
Increase lasix to 60 mg twice a day- this is one and a half tablets twice a day  Start potassium 20 meq twice a day  We will schedule you for an Echocardiogram  I will see you in one week with blood work.

## 2016-10-23 DIAGNOSIS — L720 Epidermal cyst: Secondary | ICD-10-CM | POA: Diagnosis not present

## 2016-10-23 DIAGNOSIS — L438 Other lichen planus: Secondary | ICD-10-CM | POA: Diagnosis not present

## 2016-10-23 DIAGNOSIS — Z85828 Personal history of other malignant neoplasm of skin: Secondary | ICD-10-CM | POA: Diagnosis not present

## 2016-10-23 DIAGNOSIS — L570 Actinic keratosis: Secondary | ICD-10-CM | POA: Diagnosis not present

## 2016-10-23 DIAGNOSIS — L853 Xerosis cutis: Secondary | ICD-10-CM | POA: Diagnosis not present

## 2016-10-23 DIAGNOSIS — L821 Other seborrheic keratosis: Secondary | ICD-10-CM | POA: Diagnosis not present

## 2016-10-23 DIAGNOSIS — D1801 Hemangioma of skin and subcutaneous tissue: Secondary | ICD-10-CM | POA: Diagnosis not present

## 2016-10-24 ENCOUNTER — Ambulatory Visit (INDEPENDENT_AMBULATORY_CARE_PROVIDER_SITE_OTHER): Payer: Medicare Other | Admitting: Internal Medicine

## 2016-10-24 ENCOUNTER — Encounter: Payer: Self-pay | Admitting: Internal Medicine

## 2016-10-24 VITALS — BP 110/70 | HR 110 | Temp 98.1°F | Wt 185.0 lb

## 2016-10-24 DIAGNOSIS — J4520 Mild intermittent asthma, uncomplicated: Secondary | ICD-10-CM | POA: Diagnosis not present

## 2016-10-24 DIAGNOSIS — I5033 Acute on chronic diastolic (congestive) heart failure: Secondary | ICD-10-CM | POA: Diagnosis not present

## 2016-10-24 DIAGNOSIS — I482 Chronic atrial fibrillation, unspecified: Secondary | ICD-10-CM

## 2016-10-24 DIAGNOSIS — R0902 Hypoxemia: Secondary | ICD-10-CM

## 2016-10-24 NOTE — Progress Notes (Signed)
Location:  [facility] clinic Provider:  Melody Cirrincione L. Mariea Clonts, D.O., C.M.D.  Code Status: DNR Goals of Care:  Advanced Directives 10/02/2016  Does Patient Have a Medical Advance Directive? Yes  Type of Paramedic of [place];Out of facility DNR (pink MOST or yellow form)  Does patient want to make changes to medical advance directive? -  Copy of [place] in Chart? Yes  Pre-existing out of facility DNR order (yellow form or pink MOST form) Yellow form placed in chart (order not valid for inpatient use)   Chief Complaint  Patient presents with  . Medical Management of Chronic Issues    f/u on sob, chf    HPI: Patient is a 83 y.o. female seen today for medical management of chronic diseases to f/u on sob and chf.   At cardiology, Dr. [place] on 3/5:   Increase lasix to 60 mg twice a day- this is one and a half tablets twice a day.  Start potassium 20 meq twice a day Echocardiogram ordered.  F/u in 1 week for labs.  Today, she feels about the same, maybe a little tireder.   Her oxygen was 89% coming in.  HR was also 110.   Her daughter reports she is even having difficulty getting in and out of the car.  She has trouble getting up out of a chair.  She did lose 5 lbs of fluid since 3/2 appt.  185 lbs.   She is thinking of switching practices to [facility] b/c it's too hard to get in to see me.    Past Medical History:  Diagnosis Date  . Abnormality of gait 10/09/2008  . Aortic valve sclerosis   . Atrial fibrillation, persistent ([place])   . Closed fracture of shaft of femur ([place]) 11.14/2012  . Enthesopathy of unspecified site 03/19/2009  . Fatigue   . H/O total hysterectomy   . Hyperlipidemia   . Hypertension   . MVP (mitral valve prolapse)   . Myalgia and myositis, unspecified 08/06/2010  . Nonspecific abnormal results of pulmonary system function study 08/06/2010  . Nontoxic uninodular goiter 08/06/2010  . OA (osteoarthritis)   . Obesity   .  Osteoporosis, senile 08/15/2004  . Other abnormal blood chemistry 06/25/2010  . Overweight and obesity(278.0) 11/11/2012  . Pain in joint, ankle and foot 12/28/2008  . Plantar fasciitis   . Shortness of breath    WITH EXERTION  . Thyroid disease   . Unspecified asthma, with exacerbation 12/02/2005  . Unspecified essential hypertension 08/15/2004  . Unspecified hypothyroidism 08/06/2010  . Vitamin D deficiency     Past Surgical History:  Procedure Laterality Date  . ABDOMINAL HYSTERECTOMY  2001   Total Dr. Rexford Maus  . BASAL CELL CARCINOMA EXCISION  2006   Above left side of lip  . CATARACT EXTRACTION W/ INTRAOCULAR LENS  IMPLANT, BILATERAL  08/2015   Dr. Maricela Curet  . COLONOSCOPY  09/27/2008   Dr. Sammuel Cooper normal  . [place]  . [place]  . FRACTURE SURGERY Left 2012   hip ORIF  . SKIN CANCER EXCISION  2003   NOSE  . TONSILLECTOMY      No Known Allergies  Allergies as of 10/24/2016   No Known Allergies     Medication List       Accurate as of 10/24/16  3:28 PM. Always use your most recent med list.  atorvastatin 10 MG tablet Commonly known as:  LIPITOR Take 1 tablet (10 mg total) by mouth daily.   calcium carbonate 600 MG Tabs tablet Commonly known as:  OS-CAL Take 600 mg by mouth. Take one tablet twice daily   Fish Oil 1000 MG Caps Take 1 capsule by mouth daily. Take 1 capsule once a day.   furosemide 40 MG tablet Commonly known as:  LASIX Take one and half tablets (60 mg) twice a day   levalbuterol 0.63 MG/3ML nebulizer solution Commonly known as:  XOPENEX Take 3 mLs (0.63 mg total) by nebulization 2 (two) times daily. Use one time in the morning and one time in the evening DX J45.21   levothyroxine 50 MCG tablet Commonly known as:  SYNTHROID, LEVOTHROID Take 1 tablet (50 mcg total) by mouth daily.   metoprolol tartrate 25 MG tablet Commonly known as:  LOPRESSOR Take 1 tablet (25 mg total) by mouth 2 (two) times  daily.   multivitamin tablet Take 1 tablet by mouth daily. Take 1 tablet once a day.   potassium chloride SA 20 MEQ tablet Commonly known as:  K-DUR,KLOR-CON Take 1 tablet (20 mEq total) by mouth 2 (two) times daily.   rivaroxaban 20 MG Tabs tablet Commonly known as:  XARELTO Take 1 tablet (20 mg total) by mouth daily with supper.   Vitamin D-3 1000 units Caps Take 1 capsule by mouth daily.       Review of Systems:  Review of Systems  Constitutional: Positive for malaise/fatigue. Negative for chills and fever.  HENT:       Still some hoarseness and raspiness  Eyes: Negative for blurred vision.  Respiratory: Positive for shortness of breath. Negative for cough and wheezing.   Cardiovascular: Positive for leg swelling. Negative for chest pain and palpitations.  Gastrointestinal: Negative for abdominal pain.  Genitourinary: Positive for frequency. Negative for dysuria.  Musculoskeletal: Negative for falls.  Neurological: Positive for weakness. Negative for dizziness.  Psychiatric/Behavioral: The patient is nervous/anxious.     Health Maintenance  Topic Date Due  . TETANUS/TDAP  10/17/2012  . INFLUENZA VACCINE  Completed  . DEXA SCAN  Completed  . PNA vac Low Risk Adult  Completed    Physical Exam: Vitals:   10/24/16 1512  BP: 110/70  Pulse: (!) 110  Temp: 98.1 F (36.7 C)  TempSrc: Oral  SpO2: (!) 89%  Weight: 185 lb (83.9 kg)   Body mass index is 33.84 kg/m. Physical Exam  Constitutional: She is oriented to person, place, and time.  Dyspneic on exertion walking in with sats down to 86% on RA, 89% at rest on RA, then 96% on 2L [place]  Cardiovascular:  Tachy, irreg; pitting edema, but improved from last visit--no longer tense  Pulmonary/Chest: No respiratory distress. She has no wheezes. She has no rales.  Increased effort; no rales this time  Musculoskeletal: Normal range of motion.  Very weak and having difficulty getting up out of chair on her own now even in  chair with arms and even with O2 in use  Neurological: She is alert and oriented to person, place, and time.  Skin: Skin is warm and dry. There is pallor.  Psychiatric:  Anxious     Labs reviewed: Basic Metabolic Panel:  Recent Labs  12/26/15  05/07/16 0700 09/13/16 2138 09/13/16 2152 10/18/16 1120  NA  --   --  140 133* 134* 139  K  --   < > 4.3 3.2* 3.3* 4.4  CL  --   --   --  95* 95* 98  CO2  --   --   --  25  --  32*  GLUCOSE  --   --   --  151* 151* 110*  BUN  --   --  27* 12 12 18   CREATININE  --   --  1.0 0.81 0.80 0.84  CALCIUM  --   --   --  7.9*  --  8.9  TSH 4.56  --  3.63  --   --   --   < > = values in this interval not displayed. Liver Function Tests: No results for input(s): AST, ALT, ALKPHOS, BILITOT, PROT, ALBUMIN in the last 8760 hours. No results for input(s): LIPASE, AMYLASE in the last 8760 hours. No results for input(s): AMMONIA in the last 8760 hours. CBC:  Recent Labs  05/07/16 0700 09/13/16 2138 09/13/16 2152  WBC 6.6 6.6  --   NEUTROABS  --  5.2  --   HGB 13.1 12.3 12.2  HCT 43 37.2 36.0  MCV  --  84.7  --   PLT 214 290  --    Lipid Panel:  Recent Labs  05/07/16 0700  CHOL 178  HDL 87*  LDLCALC 74  TRIG 85   Lab Results  Component Value Date   HGBA1C 6.0 05/07/2016    Assessment/Plan 1. Acute on chronic diastolic heart failure (HCC) -improving with weight down to 185, no longer having crackles, edema better than it was (still significant), but pt hypoxic and fatigued -keep diuretics the same (lasix 60mg  po bid, kcl 50meq bid) -keep f/u labs and appt at cardiology as scheduled  2. Mild intermittent asthma without complication -cont nebs twice a day -no wheezing heard  3. Hypoxia -actually worse today and now qualifies for oxygen therapy -86% ambulating in here on RA, 89% at rest on RA -96% at rest on 2L -use oxygen 2L via [place] with activity (portable) and as needed for sob at rest -still needs pulmonary eval for PFTs  when chf under control  4. Chronic atrial fibrillation (HCC) -ongoing, cont lopressor bid 25mg , cannot up this as bp is too low, cont xarelto   Labs/tests ordered:  Oxygen therapy Next appt:  12/25/2016 and prn (pt may be transferring care)   Jerilynn Feldmeier L. Corean Yoshimura, D.O. [facility] [address], [place] [address] Cell Phone (Mon-Fri 8am-5pm):  [phone] On Call:  [phone] & follow prompts after 5pm & weekends Office Phone:  [phone] Office Fax:  [phone]

## 2016-10-25 ENCOUNTER — Encounter: Payer: Self-pay | Admitting: Cardiology

## 2016-10-25 NOTE — Progress Notes (Signed)
Gerald Leitz Date of Birth: 1928/12/13 Medical Record #[id number]  History of Present Illness: [name] is seen for follow up of atrial fibrillation and CHF. She was seen in 2011 with symptoms of dyspnea. She had an Echo at that time that showed Normal LV function, AV sclerosis, and mild MR. A stress test showed very poor exercise tolerance. She had a cardiac cath that showed no CAD and normal right heart pressures.She had pulmonary evaluation at that time with Dr. Gwenette Greet with normal PFTs.  In 2015 she was found to be in atrial fibrillation.  She was asymptomatic.   Started on metoprolol and Xarelto. She later developed findings of diastolic CHF. She was placed on lasix with improvement.   She was seen in the ED on 09/13/16 with dyspnea and concern about an abnormal Ecg. Findings felt to be consistent with COPD exacerbation and she was treated for this. Ecg showed no change from prior. She had a bad case of bronchitis for over a month and has been in the Rehab center at [facility]. She was seen by me February 7 and her SOB had improved.   She had lost 10 lbs with her recent illness. Mild edema in right ankle noted.  She was seen by Dr Mariea Clonts on 10/18/16 and noted to have weight gain, increased edema and marked increase in SOB. Lasix increased to 40 mg bid. She was seen and noted that she could not do anything  due to SOB and marked fatigue. No cough or chest pain. Marked edema. When seen last week lasix increased further to 60 mg bid and potassium added. Echo ordered. Seen today for follow up.  She is seen with her daughter today. Continues to have edema and SOB. Feeling better generally since wearing oxygen continually and sleeping better. Weight is unchanged. Using nebulizer twice a day and that seems to help.     Allergies as of 10/28/2016   No Known Allergies     Medication List       Accurate as of 10/28/16 10:29 AM. Always use your most recent med list.          atorvastatin 10 MG  tablet Commonly known as:  LIPITOR Take 1 tablet (10 mg total) by mouth daily.   calcium carbonate 600 MG Tabs tablet Commonly known as:  OS-CAL Take 600 mg by mouth. Take one tablet twice daily   Fish Oil 1000 MG Caps Take 1 capsule by mouth daily. Take 1 capsule once a day.   furosemide 40 MG tablet Commonly known as:  LASIX Take one and half tablets (60 mg) twice a day   levalbuterol 0.63 MG/3ML nebulizer solution Commonly known as:  XOPENEX Take 3 mLs (0.63 mg total) by nebulization 2 (two) times daily. Use one time in the morning and one time in the evening DX J45.21   levothyroxine 50 MCG tablet Commonly known as:  SYNTHROID, LEVOTHROID Take 1 tablet (50 mcg total) by mouth daily.   metolazone 2.5 MG tablet Commonly known as:  ZAROXOLYN Take 1 tablet (2.5 mg total) by mouth daily.   metoprolol tartrate 25 MG tablet Commonly known as:  LOPRESSOR Take 1 tablet (25 mg total) by mouth 2 (two) times daily.   multivitamin tablet Take 1 tablet by mouth daily. Take 1 tablet once a day.   potassium chloride SA 20 MEQ tablet Commonly known as:  K-DUR,KLOR-CON Take 1 tablet (20 mEq total) by mouth 2 (two) times daily.   rivaroxaban 20  MG Tabs tablet Commonly known as:  XARELTO Take 1 tablet (20 mg total) by mouth daily with supper.   Vitamin D-3 1000 units Caps Take 1 capsule by mouth daily.       No Known Allergies  Past Medical History:  Diagnosis Date  . Abnormality of gait 10/09/2008  . Aortic valve sclerosis   . Atrial fibrillation, persistent ([place])   . Closed fracture of shaft of femur ([place]) 11.14/2012  . Enthesopathy of unspecified site 03/19/2009  . Fatigue   . H/O total hysterectomy   . Hyperlipidemia   . Hypertension   . MVP (mitral valve prolapse)   . Myalgia and myositis, unspecified 08/06/2010  . Nonspecific abnormal results of pulmonary system function study 08/06/2010  . Nontoxic uninodular goiter 08/06/2010  . OA (osteoarthritis)   . Obesity     . Osteoporosis, senile 08/15/2004  . Other abnormal blood chemistry 06/25/2010  . Overweight and obesity(278.0) 11/11/2012  . Pain in joint, ankle and foot 12/28/2008  . Plantar fasciitis   . Shortness of breath    WITH EXERTION  . Thyroid disease   . Unspecified asthma, with exacerbation 12/02/2005  . Unspecified essential hypertension 08/15/2004  . Unspecified hypothyroidism 08/06/2010  . Vitamin D deficiency     Past Surgical History:  Procedure Laterality Date  . ABDOMINAL HYSTERECTOMY  2001   Total Dr. Rexford Maus  . BASAL CELL CARCINOMA EXCISION  2006   Above left side of lip  . CATARACT EXTRACTION W/ INTRAOCULAR LENS  IMPLANT, BILATERAL  08/2015   Dr. Maricela Curet  . COLONOSCOPY  09/27/2008   Dr. Sammuel Cooper normal  . [place]  . [place]  . FRACTURE SURGERY Left 2012   hip ORIF  . SKIN CANCER EXCISION  2003   NOSE  . TONSILLECTOMY      Social History   Social History  . Marital status: Married    Spouse name: N/A  . Number of children: N/A  . Years of education: N/A   Occupational History  . 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 Smoking status: Never Smoker  . Smokeless tobacco: Never Used  . Alcohol use 0.0 oz/week    3 - 4 Glasses of wine per week     Comment: OCCASIONAL GLASS OF WINE  . Drug use: No  . Sexual activity: Not Currently   Other Topics Concern  . None   Social History Narrative   Married 1954, [name].    Retired Pharmacist, hospital.    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 Spends summers in [place].    No smoking history    drinks minimal amount of alcohol.   Exercise predominantly weight training, walking dog   DNR    Family History  Problem Relation Age of Onset  . Stroke Mother 36    FOLLOWING A BROKEN HIP  . Stroke Father   . Aneurysm Father     Abdominal aortic, repaired    Review of Systems: As noted in HPI.  All other  systems were reviewed and are negative.  Physical Exam: BP 120/68   Pulse 88   Ht 5\' 2"  (1.575 m)   Wt 185 lb (83.9 kg)   BMI 33.84 kg/m  Filed Weights   10/28/16 0956  Weight: 185 lb (83.9 kg)  GENERAL:  Well appearing obese WF in NAD. HEENT:  PERRL, EOMI, sclera are  clear. Oropharynx is clear. NECK:  No jugular venous distention, carotid upstroke brisk and symmetric, no bruits, no thyromegaly or adenopathy LUNGS:  Clear to auscultation bilaterally CHEST:  Unremarkable HEART:  IRRR,  PMI not displaced or sustained,S1 and S2 within normal limits, no S3, no S4: no clicks, no rubs, no murmurs ABD:  Soft, nontender. BS +, no masses or bruits. No hepatomegaly, no splenomegaly EXT:  2 + pulses throughout, 2-3+ bilateral edema, no cyanosis no clubbing.  SKIN:  Warm and dry.  No rashes NEURO:  Alert and oriented x 3. Cranial nerves II through XII intact. PSYCH:  Cognitively intact   LABORATORY DATA: Lab Results  Component Value Date   WBC 6.6 09/13/2016   HGB 12.2 09/13/2016   HCT 36.0 09/13/2016   PLT 290 09/13/2016   GLUCOSE 110 (H) 10/18/2016   CHOL 178 05/07/2016   TRIG 85 05/07/2016   HDL 87 (A) 05/07/2016   LDLCALC 74 05/07/2016   ALT 15 06/13/2015   AST 19 06/13/2015   NA 139 10/18/2016   K 4.4 10/18/2016   CL 98 10/18/2016   CREATININE 0.84 10/18/2016   BUN 18 10/18/2016   CO2 32 (H) 10/18/2016   TSH 3.63 05/07/2016   HGBA1C 6.0 05/07/2016     PORTABLE CHEST 1 VIEW  COMPARISON:  Chest radiograph dated 09/03/2016 and CT dated 05/23/2016  FINDINGS: The patient is slightly rotated to the left. The lungs are clear. Left upper lobe calcified granuloma. There is no pleural effusion or pneumothorax. The cardiac silhouette is within normal limits given patient's rotation. Old healed right posterior rib fracture. No acute fracture.  IMPRESSION: No active disease.   Electronically Signed   By: Anner Crete M.D.   On: 09/13/2016 21:59  Assessment /  Plan: 1. Atrial fibrillation chronic.  Rate currently well controlled on metoprolol.  Anticoagulated with Xarelto with elevated CHAD-vasc score of 4.   2. HTN controlled. On metoprolol and lasix.  3. Acute on Chronic diastolic CHF. She appears to be significantly volume overloaded still. She has not responded to increased lasix.  Will continue lasix to 60 mg twice daily.  Update Echo on March 22. Add metolazone 2.5 mg daily for three days then every third day. Follow up on March 23. Will check BMET and BNP today and on follow up.  Normal right and left heart cath in 2011. Continue  sodium restriction. Pulmonary embolus is unlikely on anticoagulant therapy.  4. Hypothyroidism. On replacement.   5. Hypercholesterolemia.   6. Bronchitis with COPD exacerbation in January. Improving. I think current limitation more from CHF.

## 2016-10-28 ENCOUNTER — Encounter: Payer: Self-pay | Admitting: Cardiology

## 2016-10-28 ENCOUNTER — Ambulatory Visit (INDEPENDENT_AMBULATORY_CARE_PROVIDER_SITE_OTHER): Payer: Medicare Other | Admitting: Cardiology

## 2016-10-28 VITALS — BP 120/68 | HR 88 | Ht 62.0 in | Wt 185.0 lb

## 2016-10-28 DIAGNOSIS — Z7901 Long term (current) use of anticoagulants: Secondary | ICD-10-CM | POA: Insufficient documentation

## 2016-10-28 DIAGNOSIS — I5033 Acute on chronic diastolic (congestive) heart failure: Secondary | ICD-10-CM | POA: Diagnosis not present

## 2016-10-28 DIAGNOSIS — I482 Chronic atrial fibrillation, unspecified: Secondary | ICD-10-CM

## 2016-10-28 DIAGNOSIS — I10 Essential (primary) hypertension: Secondary | ICD-10-CM

## 2016-10-28 LAB — BASIC METABOLIC PANEL
BUN: 29 mg/dL — ABNORMAL HIGH (ref 7–25)
CALCIUM: 8.8 mg/dL (ref 8.6–10.4)
CHLORIDE: 94 mmol/L — AB (ref 98–110)
CO2: 33 mmol/L — ABNORMAL HIGH (ref 20–31)
CREATININE: 0.89 mg/dL — AB (ref 0.60–0.88)
Glucose, Bld: 183 mg/dL — ABNORMAL HIGH (ref 65–99)
Potassium: 4.2 mmol/L (ref 3.5–5.3)
Sodium: 139 mmol/L (ref 135–146)

## 2016-10-28 MED ORDER — METOLAZONE 2.5 MG PO TABS: 2.5000 mg | ORAL_TABLET | Freq: Every day | ORAL | 3 refills | Status: DC

## 2016-10-28 NOTE — Patient Instructions (Addendum)
Continue your current therapy  Add metolazone 2.5 mg daily for three days then every third day  We will check blood work today  I will see you back after your Echo is done

## 2016-10-29 ENCOUNTER — Telehealth: Payer: Self-pay | Admitting: Cardiology

## 2016-10-29 ENCOUNTER — Telehealth: Payer: Self-pay | Admitting: *Deleted

## 2016-10-29 DIAGNOSIS — I10 Essential (primary) hypertension: Secondary | ICD-10-CM

## 2016-10-29 DIAGNOSIS — I5033 Acute on chronic diastolic (congestive) heart failure: Secondary | ICD-10-CM

## 2016-10-29 LAB — BRAIN NATRIURETIC PEPTIDE: Brain Natriuretic Peptide: 292.3 pg/mL — ABNORMAL HIGH (ref <100)

## 2016-10-29 NOTE — Telephone Encounter (Signed)
Received fax from [facility] [phone] for CMN for Oxygen. Completed form and placed in Dr. Cyndi Lennert folder for review and signature. To be faxed back to [phone]

## 2016-10-29 NOTE — Telephone Encounter (Signed)
Follow up    Missed Dr Morrison Old call when he called her back

## 2016-10-29 NOTE — Telephone Encounter (Signed)
Please call, needs to talk to you about her bathroom.Marland Kitchen

## 2016-10-29 NOTE — Telephone Encounter (Signed)
Returned call to patient she stated she is taking metolazone 2.5 mg daily for 3 days then 2.5 mg every 3rd day.Stated she is on her 3rd day.Stated she feels like she is not urinating as much.She does feel alittle better.She is also constipated,stools are hard since she started taking metolazone.Advised she can take a stool softener.Also stated KDur too big to swallow she has been breaking in half and still having problems.Advise to put in applesauce.Advised I will send message to [place] for review.

## 2016-10-29 NOTE — Telephone Encounter (Signed)
Returned call to patient she stated she thought Dr.Jordan called her.Advised Dr.Jordan out of office this afternoon.Advised I am waiting on message sent to him earlier.Advised may be tomorrow before I call her back.

## 2016-10-30 NOTE — Telephone Encounter (Signed)
She can stay on metolazone daily for now. Needs repeat BMET this Friday   Keisi Eckford Martinique MD, [facility]

## 2016-10-31 NOTE — Telephone Encounter (Signed)
Returned call to patient no answer.LMTC. 

## 2016-10-31 NOTE — Telephone Encounter (Signed)
New Message     Pt is returning [place] call about fluid pill please call

## 2016-10-31 NOTE — Telephone Encounter (Signed)
Spoke to patient Dr.Jordan advised to take Metolazone 2.5 mg daily for now.Patient will repeat Bmet on Monday 11/04/16 lab order mailed.

## 2016-11-01 DIAGNOSIS — Z1389 Encounter for screening for other disorder: Secondary | ICD-10-CM | POA: Diagnosis not present

## 2016-11-01 DIAGNOSIS — E038 Other specified hypothyroidism: Secondary | ICD-10-CM | POA: Diagnosis not present

## 2016-11-01 DIAGNOSIS — I4891 Unspecified atrial fibrillation: Secondary | ICD-10-CM | POA: Diagnosis not present

## 2016-11-01 DIAGNOSIS — Z6832 Body mass index (BMI) 32.0-32.9, adult: Secondary | ICD-10-CM | POA: Diagnosis not present

## 2016-11-01 DIAGNOSIS — R600 Localized edema: Secondary | ICD-10-CM | POA: Diagnosis not present

## 2016-11-01 DIAGNOSIS — J45998 Other asthma: Secondary | ICD-10-CM | POA: Diagnosis not present

## 2016-11-01 DIAGNOSIS — I509 Heart failure, unspecified: Secondary | ICD-10-CM | POA: Diagnosis not present

## 2016-11-01 DIAGNOSIS — E784 Other hyperlipidemia: Secondary | ICD-10-CM | POA: Diagnosis not present

## 2016-11-04 DIAGNOSIS — I10 Essential (primary) hypertension: Secondary | ICD-10-CM | POA: Diagnosis not present

## 2016-11-04 DIAGNOSIS — I5033 Acute on chronic diastolic (congestive) heart failure: Secondary | ICD-10-CM | POA: Diagnosis not present

## 2016-11-04 LAB — BASIC METABOLIC PANEL
BUN: 18 mg/dL (ref 7–25)
CO2: 32 mmol/L — ABNORMAL HIGH (ref 20–31)
Calcium: 9.4 mg/dL (ref 8.6–10.4)
Chloride: 98 mmol/L (ref 98–110)
Creat: 0.87 mg/dL (ref 0.60–0.88)
GLUCOSE: 142 mg/dL — AB (ref 65–99)
POTASSIUM: 4.3 mmol/L (ref 3.5–5.3)
SODIUM: 140 mmol/L (ref 135–146)

## 2016-11-06 NOTE — Progress Notes (Signed)
Cheyenne Cruz Date of Birth: [birth year] Medical Record #[id number]  History of Present Illness: Cheyenne Cruz is seen for follow up of atrial fibrillation and CHF. She was seen in 2011 with symptoms of dyspnea. She had an Echo at that time that showed Normal LV function, AV sclerosis, and mild MR. A stress test showed very poor exercise tolerance. She had a cardiac cath that showed no CAD and normal right heart pressures.She had pulmonary evaluation at that time with Cheyenne Cruz with normal PFTs.  In 2015 she was found to be in atrial fibrillation.  She was asymptomatic.   Started on metoprolol and Xarelto. She later developed findings of diastolic CHF. She was placed on lasix with improvement.   She was seen in the ED on 09/13/16 with dyspnea and concern about an abnormal Ecg. Findings felt to be consistent with COPD exacerbation and she was treated for this. Ecg showed no change from prior. She had a bad case of bronchitis for over a month and has been in the Rehab center at [facility]. She was seen by me February 7 and her SOB had improved.   She had lost 10 lbs with her recent illness. Mild edema in right ankle noted.  She was seen by Cheyenne Cruz on 10/18/16 and noted to have weight gain, increased edema and marked increase in SOB. Lasix increased to 40 mg bid. She was seen and noted that she could not do anything  due to SOB and marked fatigue. No cough or chest pain. Marked edema. When in our office lasix increased further to 60 mg bid and potassium added. She noted no improvement on follow up one week later and was started on metolazone 2.5 mg daily. Echo performed on 11/07/16. Seen today for follow up.  She is seen with her husband today. She is feeling much better. Marked improvement in SOB. Edema has resolved. She does note the metolazone really increased her urination.    Allergies as of 11/08/2016   No Known Allergies     Medication List       Accurate as of 11/08/16 10:44 AM. Always use  your most recent med list.          atorvastatin 10 MG tablet Commonly known as:  LIPITOR Take 1 tablet (10 mg total) by mouth daily.   calcium carbonate 600 MG Tabs tablet Commonly known as:  OS-CAL Take 600 mg by mouth. Take one tablet twice daily   Fish Oil 1000 MG Caps Take 1 capsule by mouth daily. Take 1 capsule once a day.   furosemide 40 MG tablet Commonly known as:  LASIX Take one and half tablets (60 mg) twice a day   levalbuterol 0.63 MG/3ML nebulizer solution Commonly known as:  XOPENEX Take 3 mLs (0.63 mg total) by nebulization 2 (two) times daily. Use one time in the morning and one time in the evening DX J45.21   levothyroxine 50 MCG tablet Commonly known as:  SYNTHROID, LEVOTHROID Take 1 tablet (50 mcg total) by mouth daily.   metolazone 2.5 MG tablet Commonly known as:  ZAROXOLYN Take 1 tablet (2.5 mg total) by mouth daily as needed. Take for increased swelling or weight gain more than 3 lbs in one day or 5 lbs in one week   metoprolol tartrate 25 MG tablet Commonly known as:  LOPRESSOR Take 1 tablet (25 mg total) by mouth 2 (two) times daily.   multivitamin tablet Take 1 tablet by mouth daily. Take 1  tablet once a day.   potassium chloride SA 20 MEQ tablet Commonly known as:  K-DUR,KLOR-CON Take 1 tablet (20 mEq total) by mouth 2 (two) times daily.   rivaroxaban 20 MG Tabs tablet Commonly known as:  XARELTO Take 1 tablet (20 mg total) by mouth daily with supper.   Vitamin D-3 1000 units Caps Take 1 capsule by mouth daily.       No Known Allergies  Past Medical History:  Diagnosis Date  . Abnormality of gait 10/09/2008  . Aortic valve sclerosis   . Atrial fibrillation, persistent ([place])   . Closed fracture of shaft of femur ([place]) 11.14/2012  . Enthesopathy of unspecified site 03/19/2009  . Fatigue   . H/O total hysterectomy   . Hyperlipidemia   . Hypertension   . MVP (mitral valve prolapse)   . Myalgia and myositis, unspecified  08/06/2010  . Nonspecific abnormal results of pulmonary system function study 08/06/2010  . Nontoxic uninodular goiter 08/06/2010  . OA (osteoarthritis)   . Obesity   . Osteoporosis, senile 08/15/2004  . Other abnormal blood chemistry 06/25/2010  . Overweight and obesity(278.0) 11/11/2012  . Pain in joint, ankle and foot 12/28/2008  . Plantar fasciitis   . Shortness of breath    WITH EXERTION  . Thyroid disease   . Unspecified asthma, with exacerbation 12/02/2005  . Unspecified essential hypertension 08/15/2004  . Unspecified hypothyroidism 08/06/2010  . Vitamin D deficiency     Past Surgical History:  Procedure Laterality Date  . ABDOMINAL HYSTERECTOMY  2001   Total Cheyenne. Rexford Cruz  . BASAL CELL CARCINOMA EXCISION  2006   Above left side of lip  . CATARACT EXTRACTION W/ INTRAOCULAR LENS  IMPLANT, BILATERAL  08/2015   Cheyenne. Maricela Cruz  . COLONOSCOPY  09/27/2008   Cheyenne. Sammuel Cruz normal  . [place]  . [place]  . FRACTURE SURGERY Left 2012   hip ORIF  . SKIN CANCER EXCISION  2003   NOSE  . TONSILLECTOMY      Social History   Social History  . Marital status: Married    Spouse name: N/A  . Number of children: N/A  . Years of education: N/A   Occupational History  . 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 Smoking status: Never Smoker  . Smokeless tobacco: Never Used  . Alcohol use 0.0 oz/week    3 - 4 Glasses of wine per week     Comment: OCCASIONAL GLASS OF WINE  . Drug use: No  . Sexual activity: Not Currently   Other Topics Concern  . None   Social History Narrative   Married 1954, Cheyenne Cruz.    Retired Pharmacist, hospital.    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 Spends summers in [place].    No smoking history    drinks minimal amount of alcohol.   Exercise predominantly weight training, walking dog   DNR    Family History  Problem Relation Age of Onset    . Stroke Mother 67    FOLLOWING A BROKEN HIP  . Stroke Father   . Aneurysm Father     Abdominal aortic, repaired    Review of Systems: As noted in HPI.  All other systems were reviewed and are negative.  Physical Exam: BP 116/74   Pulse 90   Ht 5\' 2"  (1.575 m)   Wt 173 lb (78.5 kg)  BMI 31.64 kg/m  Filed Weights   11/08/16 1023  Weight: 173 lb (78.5 kg)  GENERAL:  Well appearing obese WF in NAD. HEENT:  PERRL, EOMI, sclera are clear. Oropharynx is clear. NECK:  No jugular venous distention, carotid upstroke brisk and symmetric, no bruits, no thyromegaly or adenopathy LUNGS:  Clear to auscultation bilaterally CHEST:  Unremarkable HEART:  IRRR,  PMI not displaced or sustained,S1 and S2 within normal limits, no S3, no S4: no clicks, no rubs, no murmurs ABD:  Soft, nontender. BS +, no masses or bruits. No hepatomegaly, no splenomegaly EXT:  2 + pulses throughout, No edema, no cyanosis no clubbing.  SKIN:  Warm and dry.  No rashes NEURO:  Alert and oriented x 3. Cranial nerves II through XII intact. PSYCH:  Cognitively intact   LABORATORY DATA: Lab Results  Component Value Date   WBC 6.6 09/13/2016   HGB 12.2 09/13/2016   HCT 36.0 09/13/2016   PLT 290 09/13/2016   GLUCOSE 142 (H) 11/04/2016   CHOL 178 05/07/2016   TRIG 85 05/07/2016   HDL 87 (A) 05/07/2016   LDLCALC 74 05/07/2016   ALT 15 06/13/2015   AST 19 06/13/2015   NA 140 11/04/2016   K 4.3 11/04/2016   CL 98 11/04/2016   CREATININE 0.87 11/04/2016   BUN 18 11/04/2016   CO2 32 (H) 11/04/2016   TSH 3.63 05/07/2016   HGBA1C 6.0 05/07/2016     PORTABLE CHEST 1 VIEW  COMPARISON:  Chest radiograph dated 09/03/2016 and CT dated 05/23/2016  FINDINGS: The patient is slightly rotated to the left. The lungs are clear. Left upper lobe calcified granuloma. There is no pleural effusion or pneumothorax. The cardiac silhouette is within normal limits given patient's rotation. Old healed right posterior rib  fracture. No acute fracture.  IMPRESSION: No active disease.   Electronically Signed   By: Anner Crete M.D.   On: 09/13/2016 21:59    Echo 11/07/16: Study Conclusions  - Left ventricle: The cavity size was normal. Wall thickness was   normal. Systolic function was normal. The estimated ejection   fraction was in the range of 50% to 55%. Wall motion was normal;   there were no regional wall motion abnormalities. - Aortic valve: There was trivial regurgitation. - Mitral valve: Systolic bowing without prolapse. - Left atrium: The atrium was moderately dilated. - Right atrium: The atrium was moderately dilated. - Atrial septum: No defect or patent foramen ovale was identified.  Assessment / Plan: 1. Atrial fibrillation chronic.  Rate currently well controlled on metoprolol.  Anticoagulated with Xarelto with elevated CHAD-vasc score of 4.   2. HTN controlled. On metoprolol and lasix.  3. Acute on Chronic diastolic CHF. She had a great response to metolazone. Weight is down 12 lbs. Edema has resolved. Breathing is much better. Off oxygen. Will drop metolazone now. May use prn for increased swelling or weight gain- instructions given. Will continue current lasix dose. Sodium restriction. I will see back in 2 months.  4. Hypothyroidism. On replacement. Now seeing Cheyenne. Ardeth Perfect.  5. Hypercholesterolemia.   6. Bronchitis with COPD exacerbation in January- resolved.

## 2016-11-07 ENCOUNTER — Other Ambulatory Visit: Payer: Self-pay

## 2016-11-07 ENCOUNTER — Ambulatory Visit (HOSPITAL_COMMUNITY): Payer: Medicare Other | Attending: Cardiovascular Disease

## 2016-11-07 DIAGNOSIS — J449 Chronic obstructive pulmonary disease, unspecified: Secondary | ICD-10-CM | POA: Insufficient documentation

## 2016-11-07 DIAGNOSIS — Z6833 Body mass index (BMI) 33.0-33.9, adult: Secondary | ICD-10-CM | POA: Insufficient documentation

## 2016-11-07 DIAGNOSIS — I482 Chronic atrial fibrillation, unspecified: Secondary | ICD-10-CM

## 2016-11-07 DIAGNOSIS — E785 Hyperlipidemia, unspecified: Secondary | ICD-10-CM | POA: Diagnosis not present

## 2016-11-07 DIAGNOSIS — I071 Rheumatic tricuspid insufficiency: Secondary | ICD-10-CM | POA: Insufficient documentation

## 2016-11-07 DIAGNOSIS — I5033 Acute on chronic diastolic (congestive) heart failure: Secondary | ICD-10-CM | POA: Diagnosis not present

## 2016-11-07 DIAGNOSIS — I110 Hypertensive heart disease with heart failure: Secondary | ICD-10-CM | POA: Insufficient documentation

## 2016-11-07 DIAGNOSIS — E669 Obesity, unspecified: Secondary | ICD-10-CM | POA: Insufficient documentation

## 2016-11-08 ENCOUNTER — Encounter: Payer: Self-pay | Admitting: Cardiology

## 2016-11-08 ENCOUNTER — Ambulatory Visit (INDEPENDENT_AMBULATORY_CARE_PROVIDER_SITE_OTHER): Payer: Medicare Other | Admitting: Cardiology

## 2016-11-08 VITALS — BP 116/74 | HR 90 | Ht 62.0 in | Wt 173.0 lb

## 2016-11-08 DIAGNOSIS — Z7901 Long term (current) use of anticoagulants: Secondary | ICD-10-CM | POA: Diagnosis not present

## 2016-11-08 DIAGNOSIS — I10 Essential (primary) hypertension: Secondary | ICD-10-CM | POA: Diagnosis not present

## 2016-11-08 DIAGNOSIS — I482 Chronic atrial fibrillation, unspecified: Secondary | ICD-10-CM

## 2016-11-08 DIAGNOSIS — I5033 Acute on chronic diastolic (congestive) heart failure: Secondary | ICD-10-CM

## 2016-11-08 MED ORDER — METOLAZONE 2.5 MG PO TABS: 2.5000 mg | ORAL_TABLET | Freq: Every day | ORAL | 3 refills | Status: DC | PRN

## 2016-11-08 NOTE — Patient Instructions (Signed)
Stop taking metolazone. You may use this prn weight gain greater than 3 lbs in a day or 5 lbs in a week.   Continue your other therapy  I will see you in 2 months.

## 2016-11-13 DIAGNOSIS — Z6832 Body mass index (BMI) 32.0-32.9, adult: Secondary | ICD-10-CM | POA: Diagnosis not present

## 2016-11-13 DIAGNOSIS — I509 Heart failure, unspecified: Secondary | ICD-10-CM | POA: Diagnosis not present

## 2016-11-13 DIAGNOSIS — I4891 Unspecified atrial fibrillation: Secondary | ICD-10-CM | POA: Diagnosis not present

## 2016-11-13 DIAGNOSIS — R600 Localized edema: Secondary | ICD-10-CM | POA: Diagnosis not present

## 2016-11-13 DIAGNOSIS — J45998 Other asthma: Secondary | ICD-10-CM | POA: Diagnosis not present

## 2016-11-15 ENCOUNTER — Telehealth: Payer: Self-pay | Admitting: Cardiology

## 2016-11-15 NOTE — Telephone Encounter (Signed)
New message    Pt daughter verbalized that she is calling for rn because she states that [facility]  needs a order stating that pt no longer needs oxygen   Fax [phone]

## 2016-11-15 NOTE — Telephone Encounter (Signed)
New Message  Pt voiced wanting to speak to the nurse.  Please f/u

## 2016-11-15 NOTE — Telephone Encounter (Signed)
Returned call to patient-patient wondering if she still needs oxygen.    Per chart review, last OV noted patient was off oxygen.  Advised patient to contact company in regards to picking up equipment.  Patient verbalized understanding.

## 2016-11-15 NOTE — Telephone Encounter (Signed)
Faxed via Epic to [facility]-as requested

## 2016-11-15 NOTE — Telephone Encounter (Signed)
OK to order cessation of oxygen  Peter Martinique MD, [facility]

## 2016-11-15 NOTE — Telephone Encounter (Signed)
Left message to call back.  Routed to MD for approval.

## 2016-12-02 ENCOUNTER — Other Ambulatory Visit: Payer: Self-pay | Admitting: Internal Medicine

## 2016-12-25 ENCOUNTER — Encounter: Payer: Self-pay | Admitting: Internal Medicine

## 2017-01-01 DIAGNOSIS — E038 Other specified hypothyroidism: Secondary | ICD-10-CM | POA: Diagnosis not present

## 2017-01-01 DIAGNOSIS — N390 Urinary tract infection, site not specified: Secondary | ICD-10-CM | POA: Diagnosis not present

## 2017-01-01 DIAGNOSIS — E784 Other hyperlipidemia: Secondary | ICD-10-CM | POA: Diagnosis not present

## 2017-01-01 DIAGNOSIS — R8299 Other abnormal findings in urine: Secondary | ICD-10-CM | POA: Diagnosis not present

## 2017-01-07 DIAGNOSIS — E038 Other specified hypothyroidism: Secondary | ICD-10-CM | POA: Diagnosis not present

## 2017-01-07 DIAGNOSIS — M859 Disorder of bone density and structure, unspecified: Secondary | ICD-10-CM | POA: Diagnosis not present

## 2017-01-07 DIAGNOSIS — Z0000 Encounter for general adult medical examination without abnormal findings: Secondary | ICD-10-CM | POA: Diagnosis not present

## 2017-01-07 DIAGNOSIS — Z1389 Encounter for screening for other disorder: Secondary | ICD-10-CM | POA: Diagnosis not present

## 2017-01-07 DIAGNOSIS — R600 Localized edema: Secondary | ICD-10-CM | POA: Diagnosis not present

## 2017-01-07 DIAGNOSIS — I509 Heart failure, unspecified: Secondary | ICD-10-CM | POA: Diagnosis not present

## 2017-01-07 DIAGNOSIS — E784 Other hyperlipidemia: Secondary | ICD-10-CM | POA: Diagnosis not present

## 2017-01-07 DIAGNOSIS — I4891 Unspecified atrial fibrillation: Secondary | ICD-10-CM | POA: Diagnosis not present

## 2017-01-07 DIAGNOSIS — Z6831 Body mass index (BMI) 31.0-31.9, adult: Secondary | ICD-10-CM | POA: Diagnosis not present

## 2017-01-07 NOTE — Progress Notes (Signed)
Gerald Leitz Date of Birth: 1929/02/25 Medical Record #[id number]  History of Present Illness: Cheyenne Cruz is seen for follow up of atrial fibrillation and CHF. She was seen in 2011 with symptoms of dyspnea. She had an Echo at that time that showed Normal LV function, AV sclerosis, and mild MR. A stress test showed very poor exercise tolerance. She had a cardiac cath that showed no CAD and normal right heart pressures.She had pulmonary evaluation at that time with Dr. Gwenette Greet with normal PFTs.  In 2015 she was found to be in atrial fibrillation.  She was asymptomatic.   Started on metoprolol and Xarelto. She later developed findings of diastolic CHF. She was placed on lasix with improvement.   She was seen in the ED on 09/13/16 with dyspnea and concern about an abnormal Ecg. Findings felt to be consistent with COPD exacerbation and she was treated for this. Ecg showed no change from prior. She had a bad case of bronchitis for over a month and has been in the Rehab center at [facility]. She was seen by me February 7 and her SOB had improved.   She had lost 10 lbs with her recent illness. Mild edema in right ankle noted.  She was seen by Dr Mariea Clonts on 10/18/16 and noted to have weight gain, increased edema and marked increase in SOB. Lasix increased to 40 mg bid. She was seen and noted that she could not do anything due to SOB and marked fatigue. No cough or chest pain. Marked edema. When in our office lasix increased further to 60 mg bid and potassium added. She noted no improvement on follow up one week later and was started on metolazone 2.5 mg daily. Echo performed on 11/07/16 showed low normal LV function with moderate biatrial enlargment. No significant valvular disease. When seen in March she was doing much better with a 12 lb weight loss. Metolazone was reduced to prn. Home oxygen was discontinued.   She is seen with her husband today. She is feeling very well. Notes her breathing is doing much  better.  She has used metolazone on 2 occasions since her last visit with a brisk response.  She also notes that if she eats too much salt she immediately swells more. She is getting ready to return to [place] for the summer.   Allergies as of 01/09/2017   No Known Allergies     Medication List       Accurate as of 01/09/17  8:59 AM. Always use your most recent med list.          atorvastatin 10 MG tablet Commonly known as:  LIPITOR Take 1 tablet (10 mg total) by mouth daily.   calcium carbonate 600 MG Tabs tablet Commonly known as:  OS-CAL Take 600 mg by mouth. Take one tablet twice daily   Fish Oil 1000 MG Caps Take 1 capsule by mouth daily. Take 1 capsule once a day.   furosemide 40 MG tablet Commonly known as:  LASIX Take one and half tablets (60 mg) twice a day   levalbuterol 0.63 MG/3ML nebulizer solution Commonly known as:  XOPENEX Take 3 mLs (0.63 mg total) by nebulization 2 (two) times daily. Use one time in the morning and one time in the evening DX J45.21   levothyroxine 50 MCG tablet Commonly known as:  SYNTHROID, LEVOTHROID Take 1 tablet (50 mcg total) by mouth daily.   metolazone 2.5 MG tablet Commonly known as:  ZAROXOLYN Take 1  tablet (2.5 mg total) by mouth daily as needed. Take for increased swelling or weight gain more than 3 lbs in one day or 5 lbs in one week   metoprolol tartrate 25 MG tablet Commonly known as:  LOPRESSOR Take 1 tablet (25 mg total) by mouth 2 (two) times daily.   multivitamin tablet Take 1 tablet by mouth daily. Take 1 tablet once a day.   potassium chloride SA 20 MEQ tablet Commonly known as:  K-DUR,KLOR-CON Take 1 tablet (20 mEq total) by mouth 2 (two) times daily.   rivaroxaban 20 MG Tabs tablet Commonly known as:  XARELTO Take 1 tablet (20 mg total) by mouth daily with supper.   Vitamin D-3 1000 units Caps Take 1 capsule by mouth daily.       No Known Allergies  Past Medical History:  Diagnosis Date  .  Abnormality of gait 10/09/2008  . Aortic valve sclerosis   . Atrial fibrillation, persistent ([place])   . Closed fracture of shaft of femur ([place]) 11.14/2012  . Enthesopathy of unspecified site 03/19/2009  . Fatigue   . H/O total hysterectomy   . Hyperlipidemia   . Hypertension   . MVP (mitral valve prolapse)   . Myalgia and myositis, unspecified 08/06/2010  . Nonspecific abnormal results of pulmonary system function study 08/06/2010  . Nontoxic uninodular goiter 08/06/2010  . OA (osteoarthritis)   . Obesity   . Osteoporosis, senile 08/15/2004  . Other abnormal blood chemistry 06/25/2010  . Overweight and obesity(278.0) 11/11/2012  . Pain in joint, ankle and foot 12/28/2008  . Plantar fasciitis   . Shortness of breath    WITH EXERTION  . Thyroid disease   . Unspecified asthma, with exacerbation 12/02/2005  . Unspecified essential hypertension 08/15/2004  . Unspecified hypothyroidism 08/06/2010  . Vitamin D deficiency     Past Surgical History:  Procedure Laterality Date  . ABDOMINAL HYSTERECTOMY  2001   Total Dr. Rexford Maus  . BASAL CELL CARCINOMA EXCISION  2006   Above left side of lip  . CATARACT EXTRACTION W/ INTRAOCULAR LENS  IMPLANT, BILATERAL  08/2015   Dr. Maricela Curet  . COLONOSCOPY  09/27/2008   Dr. Sammuel Cooper normal  . [place]  . [place]  . FRACTURE SURGERY Left 2012   hip ORIF  . SKIN CANCER EXCISION  2003   NOSE  . TONSILLECTOMY      Social History   Social History  . Marital status: Married    Spouse name: N/A  . Number of children: N/A  . Years of education: N/A   Occupational History  . 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 Smoking status: Never Smoker  . Smokeless tobacco: Never Used  . Alcohol use 0.0 oz/week    3 - 4 Glasses of wine per week     Comment: OCCASIONAL GLASS OF WINE  . Drug use: No  . Sexual activity: Not Currently   Other Topics Concern  . None   Social  History Narrative   Married 1954, [name].    Retired Pharmacist, hospital.    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 Spends summers in [place].    No smoking history    drinks minimal amount of alcohol.   Exercise predominantly weight training, walking dog   DNR    Family History  Problem Relation Age of Onset  . Stroke Mother 6  FOLLOWING A BROKEN HIP  . Stroke Father   . Aneurysm Father        Abdominal aortic, repaired    Review of Systems: As noted in HPI.  All other systems were reviewed and are negative.  Physical Exam: BP 98/64   Pulse 68   Ht 5\' 2"  (1.575 m)   Wt 173 lb 6.4 oz (78.7 kg)   SpO2 96%   BMI 31.72 kg/m  Filed Weights   01/09/17 0835  Weight: 173 lb 6.4 oz (78.7 kg)  GENERAL:  Well appearing obese WF in NAD. HEENT:  PERRL, EOMI, sclera are clear. Oropharynx is clear. NECK:  No jugular venous distention, carotid upstroke brisk and symmetric, no bruits, no thyromegaly or adenopathy LUNGS:  Clear to auscultation bilaterally CHEST:  Unremarkable HEART:  IRRR,  PMI not displaced or sustained,S1 and S2 within normal limits, no S3, no S4: no clicks, no rubs, no murmurs ABD:  Soft, nontender. BS +, no masses or bruits. No hepatomegaly, no splenomegaly EXT:  2 + pulses throughout, she has pitting edema in right ankle and foot but not the left. SKIN:  Warm and dry.  No rashes NEURO:  Alert and oriented x 3. Cranial nerves II through XII intact. PSYCH:  Cognitively intact   LABORATORY DATA: Lab Results  Component Value Date   WBC 6.6 09/13/2016   HGB 12.2 09/13/2016   HCT 36.0 09/13/2016   PLT 290 09/13/2016   GLUCOSE 142 (H) 11/04/2016   CHOL 178 05/07/2016   TRIG 85 05/07/2016   HDL 87 (A) 05/07/2016   LDLCALC 74 05/07/2016   ALT 15 06/13/2015   AST 19 06/13/2015   NA 140 11/04/2016   K 4.3 11/04/2016   CL 98 11/04/2016   CREATININE 0.87 11/04/2016   BUN 18 11/04/2016   CO2 32 (H) 11/04/2016   TSH 3.63  05/07/2016   HGBA1C 6.0 05/07/2016   Labs dated 01/01/17: cholesterol 181, triglycerides 83, HDL 55, LDL 109. CMET, TSH, and CBC normal.   PORTABLE CHEST 1 VIEW  COMPARISON:  Chest radiograph dated 09/03/2016 and CT dated 05/23/2016  FINDINGS: The patient is slightly rotated to the left. The lungs are clear. Left upper lobe calcified granuloma. There is no pleural effusion or pneumothorax. The cardiac silhouette is within normal limits given patient's rotation. Old healed right posterior rib fracture. No acute fracture.  IMPRESSION: No active disease.   Electronically Signed   By: Anner Crete M.D.   On: 09/13/2016 21:59    Echo 11/07/16: Study Conclusions  - Left ventricle: The cavity size was normal. Wall thickness was   normal. Systolic function was normal. The estimated ejection   fraction was in the range of 50% to 55%. Wall motion was normal;   there were no regional wall motion abnormalities. - Aortic valve: There was trivial regurgitation. - Mitral valve: Systolic bowing without prolapse. - Left atrium: The atrium was moderately dilated. - Right atrium: The atrium was moderately dilated. - Atrial septum: No defect or patent foramen ovale was identified.  Assessment / Plan: 1. Atrial fibrillation chronic.  Rate currently well controlled on metoprolol.  Anticoagulated with Xarelto with elevated CHAD-vasc score of 4.   2. HTN controlled. On metoprolol and lasix.  3.  Chronic diastolic CHF. She appears to be doing well today. She does have some right ankle and foot swelling. I suspect she has more venous insufficiency on that side.  Continue to use prn metolazone for increased swelling or weight gain.  Will continue current lasix dose. Sodium restriction. Encourage use of support hose when possible. Recent TSH normal.   5. Hypercholesterolemia.   6. Bronchitis with COPD exacerbation in January- resolved.    I will follow up in 6 months when she returns  from [place].

## 2017-01-09 ENCOUNTER — Ambulatory Visit (INDEPENDENT_AMBULATORY_CARE_PROVIDER_SITE_OTHER): Payer: Medicare Other | Admitting: Cardiology

## 2017-01-09 ENCOUNTER — Encounter: Payer: Self-pay | Admitting: Cardiology

## 2017-01-09 ENCOUNTER — Ambulatory Visit: Payer: Medicare Other | Admitting: Cardiology

## 2017-01-09 VITALS — BP 98/64 | HR 68 | Ht 62.0 in | Wt 173.4 lb

## 2017-01-09 DIAGNOSIS — I482 Chronic atrial fibrillation, unspecified: Secondary | ICD-10-CM

## 2017-01-09 DIAGNOSIS — I10 Essential (primary) hypertension: Secondary | ICD-10-CM

## 2017-01-09 DIAGNOSIS — I5032 Chronic diastolic (congestive) heart failure: Secondary | ICD-10-CM | POA: Diagnosis not present

## 2017-01-09 DIAGNOSIS — Z7901 Long term (current) use of anticoagulants: Secondary | ICD-10-CM | POA: Diagnosis not present

## 2017-01-09 NOTE — Patient Instructions (Signed)
Continue your current therapy  Watch your salt intake.  Wear support hose when possible.  I will see you in 6 months.

## 2017-01-10 ENCOUNTER — Telehealth: Payer: Self-pay | Admitting: Cardiology

## 2017-01-10 MED ORDER — RIVAROXABAN 20 MG PO TABS: 20.0000 mg | ORAL_TABLET | Freq: Every day | ORAL | 3 refills | Status: DC

## 2017-01-10 NOTE — Telephone Encounter (Signed)
New Message   *STAT* If patient is at the pharmacy, call can be transferred to refill team.   1. Which medications need to be refilled? (please list name of each medication and dose if known)  rivaroxaban (Xarelto) 20 mg tab once daily  2. Which pharmacy/location (including street and city if local pharmacy) is medication to be sent to? [facility] [phone], [address], [place], [place]  3. Do they need a 30 day or 90 day supply?  30 day supply  Pt voiced she will be going out of town and needs her prescription filled by today.

## 2017-02-27 DIAGNOSIS — M545 Low back pain: Secondary | ICD-10-CM | POA: Diagnosis not present

## 2017-03-17 DIAGNOSIS — R2689 Other abnormalities of gait and mobility: Secondary | ICD-10-CM | POA: Diagnosis not present

## 2017-03-27 DIAGNOSIS — R2689 Other abnormalities of gait and mobility: Secondary | ICD-10-CM | POA: Diagnosis not present

## 2017-04-03 DIAGNOSIS — R2689 Other abnormalities of gait and mobility: Secondary | ICD-10-CM | POA: Diagnosis not present

## 2017-05-14 IMAGING — CR DG CHEST 2V
2 series · 2 of 2 positions shown · non-contrast
Comparison: [DATE]

CLINICAL DATA: Shortness of Breath

EXAM:
CHEST  2 VIEW

[w chest pa]
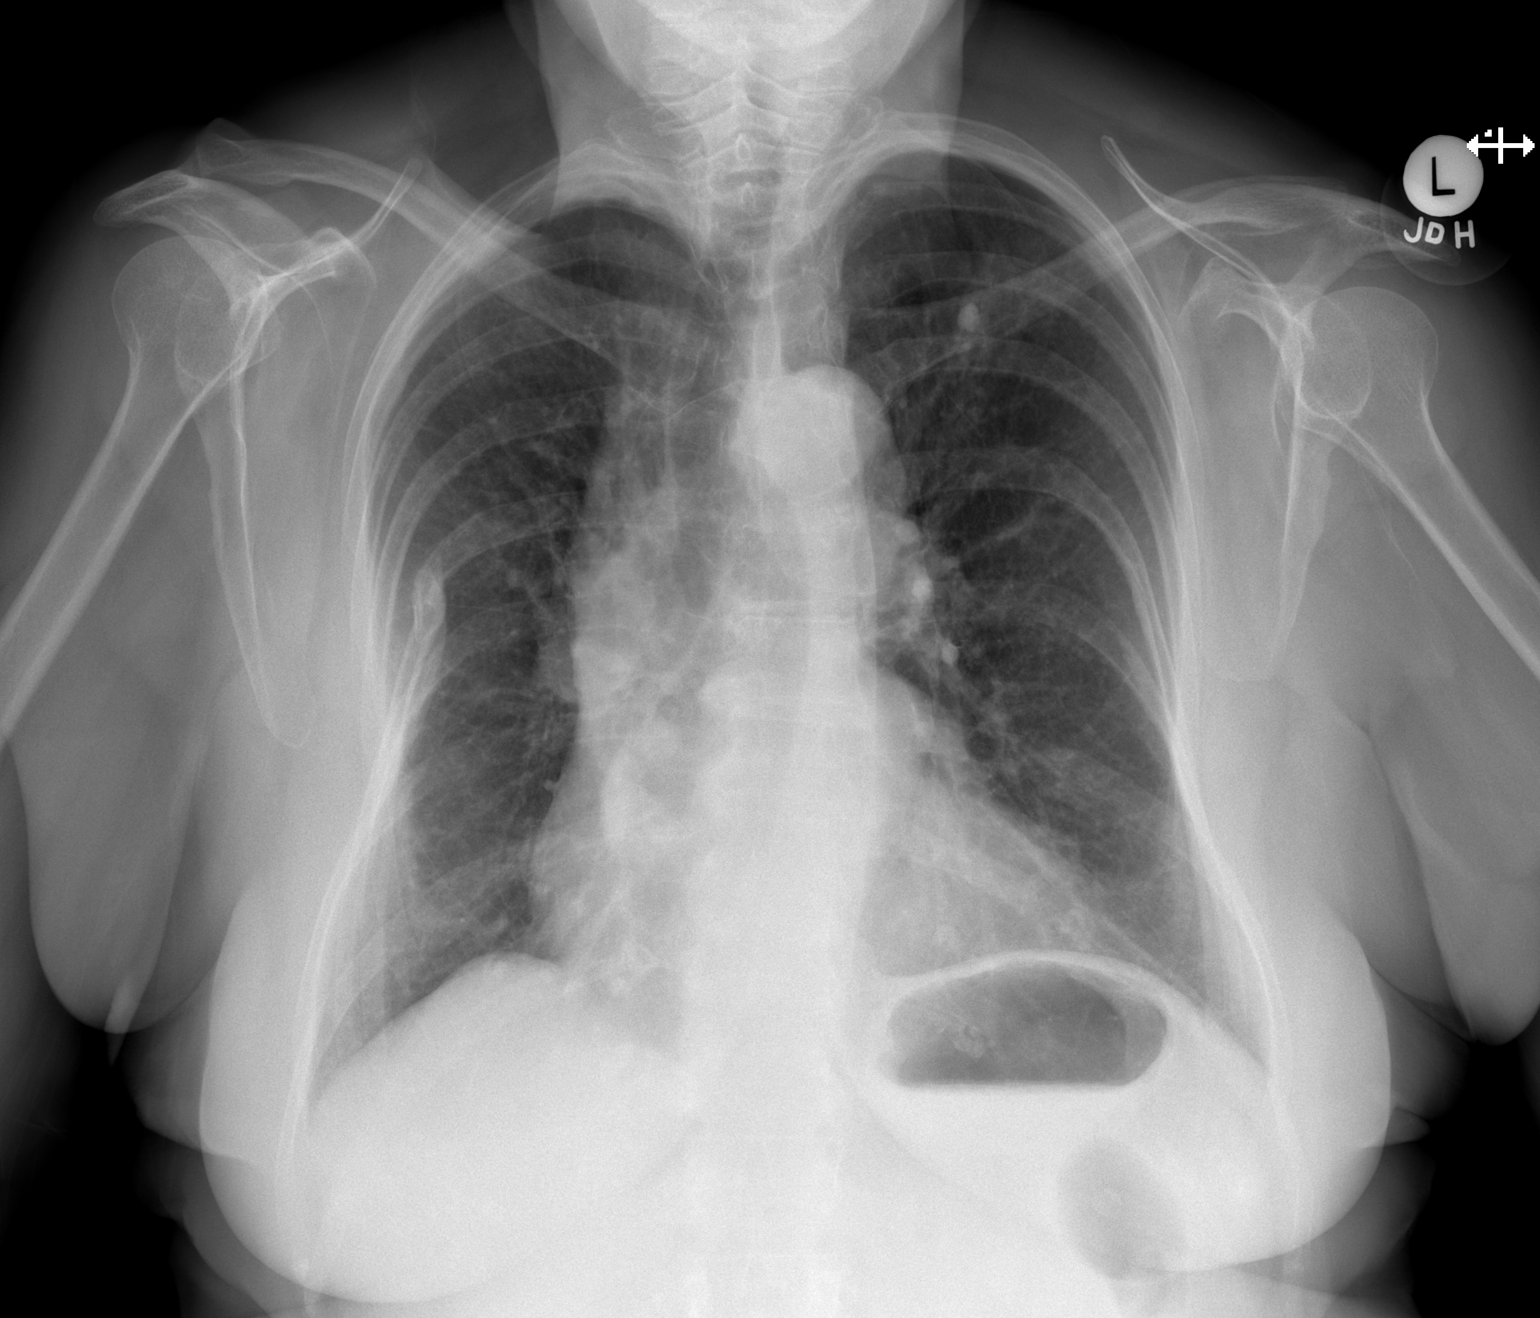

[w chest lat]
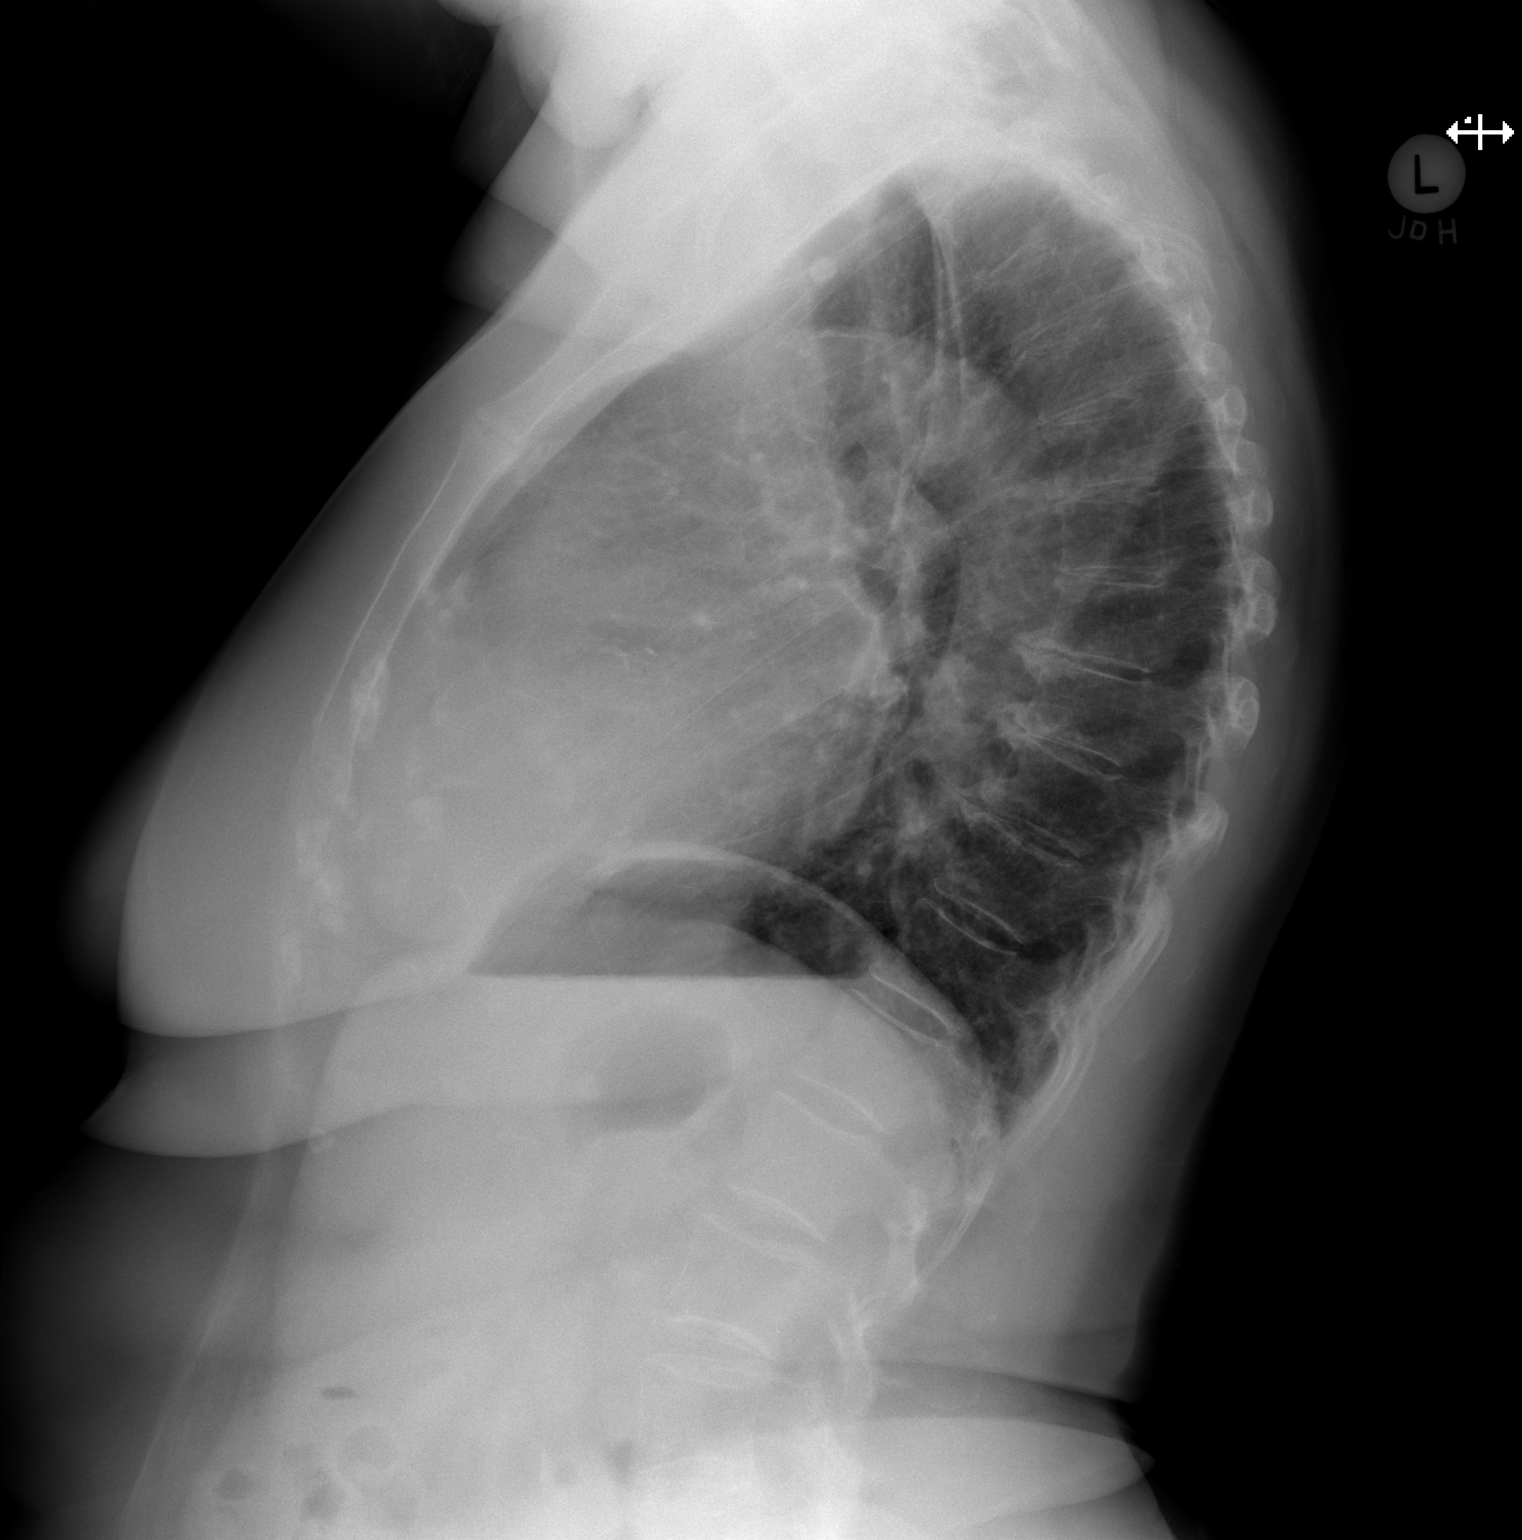

[2 of 2 positions shown; findings below may reference images not displayed]

FINDINGS: Borderline cardiomegaly. No acute infiltrate or pleural effusion. No
pulmonary edema. Calcified granuloma left upper lobe again noted.
Old fracture of the right seventh rib. Osteopenia and degenerative
changes thoracic spine.
IMPRESSION: No active cardiopulmonary disease.

## 2017-05-16 ENCOUNTER — Other Ambulatory Visit: Payer: Self-pay | Admitting: *Deleted

## 2017-05-16 MED ORDER — METOPROLOL TARTRATE 25 MG PO TABS: 25.0000 mg | ORAL_TABLET | Freq: Two times a day (BID) | ORAL | 1 refills | Status: DC

## 2017-05-19 MED ORDER — RIVAROXABAN 20 MG PO TABS: 20.0000 mg | ORAL_TABLET | Freq: Every day | ORAL | 3 refills | Status: DC

## 2017-05-20 ENCOUNTER — Other Ambulatory Visit: Payer: Self-pay | Admitting: Internal Medicine

## 2017-06-06 ENCOUNTER — Encounter: Payer: Self-pay | Admitting: Cardiology

## 2017-06-12 DIAGNOSIS — Z23 Encounter for immunization: Secondary | ICD-10-CM | POA: Diagnosis not present

## 2017-06-18 NOTE — Progress Notes (Signed)
Cheyenne Cruz Date of Birth: 01-24-1929 Medical Record #[id number]  History of Present Illness: Cheyenne Cruz is seen for follow up of atrial fibrillation and CHF. She was seen in 2011 with symptoms of dyspnea. She had an Echo at that time that showed Normal LV function, AV sclerosis, and mild MR. A stress test showed very poor exercise tolerance. She had a cardiac cath that showed no CAD and normal right heart pressures.She had pulmonary evaluation at that time with Dr. Gwenette Cruz with normal PFTs.  In 2015 she was found to be in atrial fibrillation.  She was asymptomatic.   Started on metoprolol and Xarelto. She later developed findings of diastolic CHF. She was placed on lasix with improvement.   She was seen in the ED on 09/13/16 with dyspnea and concern about an abnormal Ecg. Findings felt to be consistent with COPD exacerbation and she was treated for this. Ecg showed no change from prior. She had a bad case of bronchitis for over a month and has been in the Rehab center at [facility]. She was seen by me February 7 and her SOB had improved.     She was seen by Dr Cheyenne Cruz on 10/18/16 and noted to have weight gain, increased edema and marked increase in SOB. Lasix increased to 40 mg bid. She was seen and noted that she could not do anything due to SOB and marked fatigue. No cough or chest pain. Marked edema. When in our office lasix increased further to 60 mg bid and potassium added.  Echo performed on 11/07/16 showed low normal LV function with moderate biatrial enlargment. No significant valvular disease. When seen in March she was doing much better with a 12 lb weight loss. Metolazone was changed to prn. Home oxygen was discontinued.   She is seen with her husband today. She is feeling very well. They have returned from [place]. Notes her breathing is doing much better.  She has not used metolazone. Has rarely used albuterol.   Her weight at home has been stable. She has some chronic right  ankle edema but this also is stable. She does not exercise.   Allergies as of 06/23/2017   No Known Allergies     Medication List        Accurate as of 06/23/17  9:56 AM. Always use your most recent med list.          atorvastatin 10 MG tablet Commonly known as:  LIPITOR Take 1 tablet (10 mg total) by mouth daily.   calcium carbonate 600 MG Tabs tablet Commonly known as:  OS-CAL Take 600 mg by mouth. Take one tablet twice daily   Fish Oil 1000 MG Caps Take 1 capsule by mouth daily. Take 1 capsule once a day.   furosemide 40 MG tablet Commonly known as:  LASIX Take one and half tablets (60 mg) twice a day   levalbuterol 0.63 MG/3ML nebulizer solution Commonly known as:  XOPENEX Take 3 mLs (0.63 mg total) by nebulization 2 (two) times daily. Use one time in the morning and one time in the evening DX J45.21   levothyroxine 50 MCG tablet Commonly known as:  SYNTHROID, LEVOTHROID Take 1 tablet (50 mcg total) by mouth daily.   metolazone 2.5 MG tablet Commonly known as:  ZAROXOLYN Take 1 tablet (2.5 mg total) by mouth daily as needed. Take for increased swelling or weight gain more than 3 lbs in one day or 5 lbs in one week  metoprolol tartrate 25 MG tablet Commonly known as:  LOPRESSOR Take 1 tablet (25 mg total) by mouth 2 (two) times daily.   multivitamin tablet Take 1 tablet by mouth daily. Take 1 tablet once a day.   potassium chloride SA 20 MEQ tablet Commonly known as:  K-DUR,KLOR-CON Take 1 tablet (20 mEq total) by mouth 2 (two) times daily.   rivaroxaban 20 MG Tabs tablet Commonly known as:  XARELTO Take 1 tablet (20 mg total) daily with supper by mouth.   Vitamin D-3 1000 units Caps Take 1 capsule by mouth daily.       No Known Allergies  Past Medical History:  Diagnosis Date  . Abnormality of gait 10/09/2008  . Aortic valve sclerosis   . Atrial fibrillation, persistent ([place])   . Closed fracture of shaft of femur ([place]) 11.14/2012  .  Enthesopathy of unspecified site 03/19/2009  . Fatigue   . H/O total hysterectomy   . Hyperlipidemia   . Hypertension   . MVP (mitral valve prolapse)   . Myalgia and myositis, unspecified 08/06/2010  . Nonspecific abnormal results of pulmonary system function study 08/06/2010  . Nontoxic uninodular goiter 08/06/2010  . OA (osteoarthritis)   . Obesity   . Osteoporosis, senile 08/15/2004  . Other abnormal blood chemistry 06/25/2010  . Overweight and obesity(278.0) 11/11/2012  . Pain in joint, ankle and foot 12/28/2008  . Plantar fasciitis   . Shortness of breath    WITH EXERTION  . Thyroid disease   . Unspecified asthma, with exacerbation 12/02/2005  . Unspecified essential hypertension 08/15/2004  . Unspecified hypothyroidism 08/06/2010  . Vitamin D deficiency     Past Surgical History:  Procedure Laterality Date  . ABDOMINAL HYSTERECTOMY  2001   Total Dr. Rexford Cruz  . BASAL CELL CARCINOMA EXCISION  2006   Above left side of lip  . CATARACT EXTRACTION W/ INTRAOCULAR LENS  IMPLANT, BILATERAL  08/2015   Dr. Maricela Cruz  . COLONOSCOPY  09/27/2008   Dr. Sammuel Cruz normal  . [place]  . [place]  . FRACTURE SURGERY Left 2012   hip ORIF  . SKIN CANCER EXCISION  2003   NOSE  . TONSILLECTOMY      Social History   Socioeconomic History  . Marital status: Married    Spouse name: None  . Number of children: None  . Years of education: None  . Highest education level: None  Social Needs  . Financial resource strain: None  . Food insecurity - worry: None  . Food insecurity - inability: None  . Transportation needs - medical: None  . Transportation needs - non-medical: None  Occupational History  . 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 Smoking status: Never Smoker  . Smokeless tobacco: Never Used  Substance and Sexual Activity  . Alcohol use: Yes    Alcohol/week: 0.0 oz    Types: 3 - 4 Glasses of  wine per week    Comment: OCCASIONAL GLASS OF WINE  . Drug use: No  . Sexual activity: Not Currently  Other Topics Concern  . None  Social History Narrative   Married 1954, Cheyenne Cruz.    Retired Pharmacist, hospital.    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 Spends summers in [place].    No smoking history    drinks minimal amount of alcohol.   Exercise predominantly weight training, walking dog   DNR  Family History  Problem Relation Age of Onset  . Stroke Mother 17       FOLLOWING A BROKEN HIP  . Stroke Father   . Aneurysm Father        Abdominal aortic, repaired    Review of Systems: As noted in HPI.  All other systems were reviewed and are negative.  Physical Exam: BP 132/82   Pulse 61   Ht 5\' 2"  (1.575 m)   Wt 178 lb (80.7 kg)   SpO2 99%   BMI 32.56 kg/m  Filed Weights   06/23/17 0914  Weight: 178 lb (80.7 kg)  GENERAL:  Well appearing obese WF in NAD. HEENT:  PERRL, EOMI, sclera are clear. Oropharynx is clear. NECK:  No jugular venous distention, carotid upstroke brisk and symmetric, no bruits, no thyromegaly or adenopathy LUNGS:  Clear to auscultation bilaterally CHEST:  Unremarkable HEART:  IRRR,  PMI not displaced or sustained,S1 and S2 within normal limits, no S3, no S4: no clicks, no rubs, no murmurs ABD:  Soft, nontender. BS +, no masses or bruits. No hepatomegaly, no splenomegaly EXT:  2 + pulses throughout, Mild right ankle edema, no cyanosis no clubbing SKIN:  Warm and dry.  No rashes NEURO:  Alert and oriented x 3. Cranial nerves II through XII intact. PSYCH:  Cognitively intact     LABORATORY DATA: Lab Results  Component Value Date   WBC 6.6 09/13/2016   HGB 12.2 09/13/2016   HCT 36.0 09/13/2016   PLT 290 09/13/2016   GLUCOSE 142 (H) 11/04/2016   CHOL 178 05/07/2016   TRIG 85 05/07/2016   HDL 87 (A) 05/07/2016   LDLCALC 74 05/07/2016   ALT 15 06/13/2015   AST 19 06/13/2015   NA 140 11/04/2016   K 4.3  11/04/2016   CL 98 11/04/2016   CREATININE 0.87 11/04/2016   BUN 18 11/04/2016   CO2 32 (H) 11/04/2016   TSH 3.63 05/07/2016   HGBA1C 6.0 05/07/2016   Labs dated 01/01/17: cholesterol 181, triglycerides 83, HDL 55, LDL 109. CMET, TSH, and CBC normal.   Ecg today shows Afib with rate 65. Low voltage. I have personally reviewed and interpreted this study.  Echo 11/07/16: Study Conclusions  - Left ventricle: The cavity size was normal. Wall thickness was   normal. Systolic function was normal. The estimated ejection   fraction was in the range of 50% to 55%. Wall motion was normal;   there were no regional wall motion abnormalities. - Aortic valve: There was trivial regurgitation. - Mitral valve: Systolic bowing without prolapse. - Left atrium: The atrium was moderately dilated. - Right atrium: The atrium was moderately dilated. - Atrial septum: No defect or patent foramen ovale was identified.  Assessment / Plan: 1. Atrial fibrillation chronic.  Rate  well controlled on metoprolol.  Anticoagulated with Xarelto with elevated CHAD-vasc score of 4. Rx refilled  2. HTN controlled. On metoprolol and lasix.  3.  Chronic diastolic CHF. She appears to be euvolemic today.  She does have mild right ankle edema. I suspect she has more venous insufficiency on that side.  Continue to use prn metolazone for increased swelling or weight gain. Will continue current lasix dose. Sodium restriction.   4. Hypercholesterolemia.   5. Bronchitis with COPD - improved.     I will follow up in 6 months

## 2017-06-23 ENCOUNTER — Encounter: Payer: Self-pay | Admitting: Cardiology

## 2017-06-23 ENCOUNTER — Ambulatory Visit (INDEPENDENT_AMBULATORY_CARE_PROVIDER_SITE_OTHER): Payer: Medicare Other | Admitting: Cardiology

## 2017-06-23 VITALS — BP 132/82 | HR 61 | Ht 62.0 in | Wt 178.0 lb

## 2017-06-23 DIAGNOSIS — Z7901 Long term (current) use of anticoagulants: Secondary | ICD-10-CM

## 2017-06-23 DIAGNOSIS — I10 Essential (primary) hypertension: Secondary | ICD-10-CM | POA: Diagnosis not present

## 2017-06-23 DIAGNOSIS — I482 Chronic atrial fibrillation, unspecified: Secondary | ICD-10-CM

## 2017-06-23 DIAGNOSIS — I5032 Chronic diastolic (congestive) heart failure: Secondary | ICD-10-CM

## 2017-06-23 MED ORDER — RIVAROXABAN 20 MG PO TABS: 20.0000 mg | ORAL_TABLET | Freq: Every day | ORAL | 3 refills | Status: DC

## 2017-06-23 NOTE — Patient Instructions (Signed)
Continue your current therapy  I will see you in 6 months.   

## 2017-09-16 DIAGNOSIS — H5211 Myopia, right eye: Secondary | ICD-10-CM | POA: Diagnosis not present

## 2017-09-16 DIAGNOSIS — H26491 Other secondary cataract, right eye: Secondary | ICD-10-CM | POA: Diagnosis not present

## 2017-10-30 DIAGNOSIS — H26491 Other secondary cataract, right eye: Secondary | ICD-10-CM | POA: Diagnosis not present

## 2017-11-18 ENCOUNTER — Other Ambulatory Visit: Payer: Self-pay | Admitting: Cardiology

## 2017-11-18 MED ORDER — METOPROLOL TARTRATE 25 MG PO TABS: 25.0000 mg | ORAL_TABLET | Freq: Two times a day (BID) | ORAL | 1 refills | Status: DC

## 2017-11-18 NOTE — Telephone Encounter (Signed)
Rx(s) sent to pharmacy electronically.  

## 2017-11-18 NOTE — Telephone Encounter (Signed)
Pt calling requesting a refill on metoprolol, to be sent to [facility] on [address]. Please address

## 2017-11-20 ENCOUNTER — Telehealth: Payer: Self-pay | Admitting: Cardiology

## 2017-11-20 NOTE — Telephone Encounter (Signed)
Patient calling, states that she would like to speak with Malachy Mood in regards to Metoprolol Tartrate medication.

## 2017-11-20 NOTE — Telephone Encounter (Signed)
°*  STAT* If patient is at the pharmacy, call can be transferred to refill team.   1. Which medications need to be refilled? (please list name of each medication and dose if known) Metoprolol Tartrate 25 mg  2. Which pharmacy/location (including street and city if local pharmacy) is medication to be sent to?[place], [place] - [address]  3. Do they need a 30 day or 90 day supply? [place]

## 2017-11-20 NOTE — Telephone Encounter (Signed)
Spoke with patient and she needed Metoprolol filled. Called and confirmed Rx received at [facility], advised patient

## 2017-11-20 NOTE — Telephone Encounter (Signed)
This has been sent to pharmacy, spoke with [facility] and they have Rx

## 2017-11-27 DIAGNOSIS — C44321 Squamous cell carcinoma of skin of nose: Secondary | ICD-10-CM | POA: Diagnosis not present

## 2017-11-27 DIAGNOSIS — L570 Actinic keratosis: Secondary | ICD-10-CM | POA: Diagnosis not present

## 2017-11-27 DIAGNOSIS — C44519 Basal cell carcinoma of skin of other part of trunk: Secondary | ICD-10-CM | POA: Diagnosis not present

## 2017-11-27 DIAGNOSIS — L821 Other seborrheic keratosis: Secondary | ICD-10-CM | POA: Diagnosis not present

## 2017-11-27 DIAGNOSIS — D485 Neoplasm of uncertain behavior of skin: Secondary | ICD-10-CM | POA: Diagnosis not present

## 2017-11-27 DIAGNOSIS — L918 Other hypertrophic disorders of the skin: Secondary | ICD-10-CM | POA: Diagnosis not present

## 2017-11-27 DIAGNOSIS — Z85828 Personal history of other malignant neoplasm of skin: Secondary | ICD-10-CM | POA: Diagnosis not present

## 2017-12-21 NOTE — Progress Notes (Signed)
Cheyenne Cruz Date of Birth: [birth year] Medical Record #[id number]  History of Present Illness: Cheyenne Cruz is seen for follow up of atrial fibrillation and CHF. She was seen in 2011 with symptoms of dyspnea. She had an Echo at that time that showed Normal LV function, AV sclerosis, and mild MR. A stress test showed very poor exercise tolerance. She had a cardiac cath that showed no CAD and normal right heart pressures.She had pulmonary evaluation at that time with Dr. Gwenette Greet with normal PFTs.  In 2015 she was found to be in atrial fibrillation.  She was asymptomatic.   Started on metoprolol and Xarelto. She later developed findings of diastolic CHF. She was placed on lasix with improvement.    She was seen by Dr Mariea Clonts on 10/18/16 and noted to have weight gain, increased edema and marked increase in SOB. Lasix increased to 40 mg bid. She was seen and noted that she could not do anything due to SOB and marked fatigue. No cough or chest pain. Marked edema. When in our office lasix increased further to 60 mg bid and potassium added.  Echo performed on 11/07/16 showed low normal LV function with moderate biatrial enlargment. No significant valvular disease. When seen in March 2018 she was doing much better with a 12 lb weight loss. Metolazone was changed to prn. Home oxygen was discontinued.   She is seen with her husband today. She feels well. She has noted more swelling and weight has increased 14 lbs from a year ago and 9 lbs since November. She admits she reduced her lasix to 40 mg once a day because she felt tied to the bathroom. Notes she can walk about a block before she gives out of breath. She gets SOB if she has to walk further or go uphill. No chest pain or dizziness.    Allergies as of 12/24/2017   No Known Allergies     Medication List        Accurate as of 12/24/17 11:29 AM. Always use your most recent med list.          atorvastatin 10 MG tablet Commonly known as:  LIPITOR Take 1  tablet (10 mg total) by mouth daily.   calcium carbonate 600 MG Tabs tablet Commonly known as:  OS-CAL Take 600 mg by mouth. Take one tablet twice daily   Fish Oil 1000 MG Caps Take 1 capsule by mouth daily. Take 1 capsule once a day.   furosemide 40 MG tablet Commonly known as:  LASIX Take 1 tablet (40 mg total) by mouth 2 (two) times daily.   levothyroxine 50 MCG tablet Commonly known as:  SYNTHROID, LEVOTHROID Take 1 tablet (50 mcg total) by mouth daily.   metoprolol tartrate 25 MG tablet Commonly known as:  LOPRESSOR Take 1 tablet (25 mg total) by mouth 2 (two) times daily.   multivitamin tablet Take 1 tablet by mouth daily. Take 1 tablet once a day.   potassium chloride SA 20 MEQ tablet Commonly known as:  K-DUR,KLOR-CON Take 1 tablet (20 mEq total) by mouth 2 (two) times daily.   rivaroxaban 20 MG Tabs tablet Commonly known as:  XARELTO Take 1 tablet (20 mg total) by mouth daily with supper.   Vitamin D-3 1000 units Caps Take 1 capsule by mouth daily.       No Known Allergies  Past Medical History:  Diagnosis Date  . Abnormality of gait 10/09/2008  . Aortic valve sclerosis   . Atrial  fibrillation, persistent ([place])   . Closed fracture of shaft of femur ([place]) 11.14/2012  . Enthesopathy of unspecified site 03/19/2009  . Fatigue   . H/O total hysterectomy   . Hyperlipidemia   . Hypertension   . MVP (mitral valve prolapse)   . Myalgia and myositis, unspecified 08/06/2010  . Nonspecific abnormal results of pulmonary system function study 08/06/2010  . Nontoxic uninodular goiter 08/06/2010  . OA (osteoarthritis)   . Obesity   . Osteoporosis, senile 08/15/2004  . Other abnormal blood chemistry 06/25/2010  . Overweight and obesity(278.0) 11/11/2012  . Pain in joint, ankle and foot 12/28/2008  . Plantar fasciitis   . Shortness of breath    WITH EXERTION  . Thyroid disease   . Unspecified asthma, with exacerbation 12/02/2005  . Unspecified essential hypertension  08/15/2004  . Unspecified hypothyroidism 08/06/2010  . Vitamin D deficiency     Past Surgical History:  Procedure Laterality Date  . ABDOMINAL HYSTERECTOMY  2001   Total Dr. Rexford Maus  . BASAL CELL CARCINOMA EXCISION  2006   Above left side of lip  . CATARACT EXTRACTION W/ INTRAOCULAR LENS  IMPLANT, BILATERAL  08/2015   Dr. Maricela Curet  . COLONOSCOPY  09/27/2008   Dr. Sammuel Cooper normal  . [place]  . [place]  . FRACTURE SURGERY Left 2012   hip ORIF  . SKIN CANCER EXCISION  2003   NOSE  . TONSILLECTOMY      Social History   Socioeconomic History  . Marital status: Married    Spouse name: Not on file  . Number of children: Not on file  . Years of education: Not on file  . Highest education level: Not on file  Occupational History  . Occupation: RETIRED Engineer, structural    Comment: Automotive engineer  Social Needs  . Financial resource strain: Not on file  . Food insecurity:    Worry: Not on file    Inability: Not on file  . Transportation needs:    Medical: Not on file    Non-medical: Not on file  Tobacco Use  . Smoking status: Never Smoker  . Smokeless tobacco: Never Used  Substance and Sexual Activity  . Alcohol use: Yes    Alcohol/week: 0.0 oz    Types: 3 - 4 Glasses of wine per week    Comment: OCCASIONAL GLASS OF WINE  . Drug use: No  . Sexual activity: Not Currently  Lifestyle  . Physical activity:    Days per week: Not on file    Minutes per session: Not on file  . Stress: Not on file  Relationships  . Social connections:    Talks on phone: Not on file    Gets together: Not on file    Attends religious service: Not on file    Active member of club or organization: Not on file    Attends meetings of clubs or organizations: Not on file    Relationship status: Not on file  Other Topics Concern  . Not on file  Social History Narrative   Married 1954, Cheyenne Cruz.    Retired Pharmacist, hospital.    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 Spends summers in [place].    No smoking history    drinks minimal amount of alcohol.   Exercise predominantly weight training, walking dog   DNR    Family History  Problem Relation Age of Onset  . Stroke Mother 106  FOLLOWING A BROKEN HIP  . Stroke Father   . Aneurysm Father        Abdominal aortic, repaired    Review of Systems: As noted in HPI.  All other systems were reviewed and are negative.  Physical Exam: BP 126/78 (BP Location: Left Arm, Patient Position: Sitting, Cuff Size: Large)   Pulse 67   Ht 5\' 2"  (1.575 m)   Wt 187 lb (84.8 kg)   BMI 34.20 kg/m  Filed Weights   12/24/17 1051  Weight: 187 lb (84.8 kg)  GENERAL:  Well appearing. Obese WF in NAD HEENT:  PERRL, EOMI, sclera are clear. Oropharynx is clear. NECK:  No jugular venous distention, carotid upstroke brisk and symmetric, no bruits, no thyromegaly or adenopathy LUNGS:  Clear to auscultation bilaterally CHEST:  Unremarkable HEART:  IRRR,  PMI not displaced or sustained,S1 and S2 within normal limits, no S3, no S4: no clicks, no rubs, no murmurs ABD:  Soft, nontender. BS +, no masses or bruits. No hepatomegaly, no splenomegaly EXT:  2 + pulses throughout, 2+ ankle edema, no cyanosis no clubbing SKIN:  Warm and dry.  No rashes NEURO:  Alert and oriented x 3. Cranial nerves II through XII intact. PSYCH:  Cognitively intact   LABORATORY DATA: Lab Results  Component Value Date   WBC 6.6 09/13/2016   HGB 12.2 09/13/2016   HCT 36.0 09/13/2016   PLT 290 09/13/2016   GLUCOSE 142 (H) 11/04/2016   CHOL 178 05/07/2016   TRIG 85 05/07/2016   HDL 87 (A) 05/07/2016   LDLCALC 74 05/07/2016   ALT 15 06/13/2015   AST 19 06/13/2015   NA 140 11/04/2016   K 4.3 11/04/2016   CL 98 11/04/2016   CREATININE 0.87 11/04/2016   BUN 18 11/04/2016   CO2 32 (H) 11/04/2016   TSH 3.63 05/07/2016   HGBA1C 6.0 05/07/2016   Labs dated 01/01/17: cholesterol 181,  triglycerides 83, HDL 55, LDL 109. CMET, TSH, and CBC normal.   Echo 11/07/16: Study Conclusions  - Left ventricle: The cavity size was normal. Wall thickness was   normal. Systolic function was normal. The estimated ejection   fraction was in the range of 50% to 55%. Wall motion was normal;   there were no regional wall motion abnormalities. - Aortic valve: There was trivial regurgitation. - Mitral valve: Systolic bowing without prolapse. - Left atrium: The atrium was moderately dilated. - Right atrium: The atrium was moderately dilated. - Atrial septum: No defect or patent foramen ovale was identified.  Assessment / Plan: 1. Atrial fibrillation chronic.  Rate  well controlled on metoprolol.  Anticoagulated with Xarelto with elevated CHAD-vasc score of 4. Continue current therapy  2. HTN controlled. On metoprolol and lasix.  3.  Chronic diastolic CHF. She appears volume overloaded today. Weight is up and she is more symptomatic. Recommend she take lasix 40 mg bid.   Continue Sodium restriction. Take potassium daily. She reports she is scheduled for lab work with Dr. Ardeth Perfect next week.  4. Hypercholesterolemia.     I will follow up in 6 months

## 2017-12-23 DIAGNOSIS — Z85828 Personal history of other malignant neoplasm of skin: Secondary | ICD-10-CM | POA: Diagnosis not present

## 2017-12-23 DIAGNOSIS — C44311 Basal cell carcinoma of skin of nose: Secondary | ICD-10-CM | POA: Diagnosis not present

## 2017-12-24 ENCOUNTER — Encounter: Payer: Self-pay | Admitting: Cardiology

## 2017-12-24 ENCOUNTER — Ambulatory Visit (INDEPENDENT_AMBULATORY_CARE_PROVIDER_SITE_OTHER): Payer: Medicare Other | Admitting: Cardiology

## 2017-12-24 VITALS — BP 126/78 | HR 67 | Ht 62.0 in | Wt 187.0 lb

## 2017-12-24 DIAGNOSIS — I10 Essential (primary) hypertension: Secondary | ICD-10-CM

## 2017-12-24 DIAGNOSIS — I5032 Chronic diastolic (congestive) heart failure: Secondary | ICD-10-CM | POA: Diagnosis not present

## 2017-12-24 DIAGNOSIS — I482 Chronic atrial fibrillation, unspecified: Secondary | ICD-10-CM

## 2017-12-24 MED ORDER — POTASSIUM CHLORIDE CRYS ER 20 MEQ PO TBCR: 20.0000 meq | EXTENDED_RELEASE_TABLET | Freq: Two times a day (BID) | ORAL | 6 refills | Status: DC

## 2017-12-24 MED ORDER — METOPROLOL TARTRATE 25 MG PO TABS: 25.0000 mg | ORAL_TABLET | Freq: Two times a day (BID) | ORAL | 6 refills | Status: DC

## 2017-12-24 MED ORDER — ATORVASTATIN CALCIUM 10 MG PO TABS: 10.0000 mg | ORAL_TABLET | Freq: Every day | ORAL | 6 refills | Status: DC

## 2017-12-24 MED ORDER — LEVOTHYROXINE SODIUM 50 MCG PO TABS: 50.0000 ug | ORAL_TABLET | Freq: Every day | ORAL | 6 refills | Status: AC

## 2017-12-24 MED ORDER — FUROSEMIDE 40 MG PO TABS: 40.0000 mg | ORAL_TABLET | Freq: Two times a day (BID) | ORAL | 6 refills | Status: DC

## 2017-12-24 MED ORDER — RIVAROXABAN 20 MG PO TABS: 20.0000 mg | ORAL_TABLET | Freq: Every day | ORAL | 6 refills | Status: DC

## 2017-12-24 NOTE — Patient Instructions (Signed)
Take lasix 40 mg twice a day and potassium once a day  Continue your other therapy  I will see you in 6 months.

## 2017-12-29 ENCOUNTER — Telehealth: Payer: Self-pay | Admitting: Cardiology

## 2017-12-29 NOTE — Telephone Encounter (Signed)
°*  STAT* If patient is at the pharmacy, call can be transferred to refill team.   1. Which medications need to be refilled? (please list name of each medication and dose if known) Metoprolol,Xarelto  and Furosemide-need new prescriptions  Mailed to her-she need this  To take with her to [place]  2. Which pharmacy/location (including street and city if local pharmacy) is medication to be sent to?  3. Do they need a 30 day or 90 day supply? 90 days supply for Xarelto,Metoporololl and Furosemide 180 and refills

## 2017-12-30 ENCOUNTER — Other Ambulatory Visit: Payer: Self-pay

## 2017-12-30 DIAGNOSIS — Z85828 Personal history of other malignant neoplasm of skin: Secondary | ICD-10-CM | POA: Diagnosis not present

## 2017-12-30 DIAGNOSIS — C44519 Basal cell carcinoma of skin of other part of trunk: Secondary | ICD-10-CM | POA: Diagnosis not present

## 2017-12-30 MED ORDER — METOPROLOL TARTRATE 25 MG PO TABS: 25.0000 mg | ORAL_TABLET | Freq: Two times a day (BID) | ORAL | 2 refills | Status: DC

## 2017-12-30 MED ORDER — FUROSEMIDE 40 MG PO TABS: 40.0000 mg | ORAL_TABLET | Freq: Two times a day (BID) | ORAL | 1 refills | Status: DC

## 2017-12-30 MED ORDER — RIVAROXABAN 20 MG PO TABS: 20.0000 mg | ORAL_TABLET | Freq: Every day | ORAL | 1 refills | Status: DC

## 2017-12-30 NOTE — Telephone Encounter (Signed)
Printed off RX's to be signed and mailed to patient. Thank you!

## 2018-01-05 DIAGNOSIS — M859 Disorder of bone density and structure, unspecified: Secondary | ICD-10-CM | POA: Diagnosis not present

## 2018-01-05 DIAGNOSIS — E7849 Other hyperlipidemia: Secondary | ICD-10-CM | POA: Diagnosis not present

## 2018-01-05 DIAGNOSIS — E038 Other specified hypothyroidism: Secondary | ICD-10-CM | POA: Diagnosis not present

## 2018-01-06 DIAGNOSIS — Z6834 Body mass index (BMI) 34.0-34.9, adult: Secondary | ICD-10-CM | POA: Diagnosis not present

## 2018-01-06 DIAGNOSIS — M859 Disorder of bone density and structure, unspecified: Secondary | ICD-10-CM | POA: Diagnosis not present

## 2018-01-06 DIAGNOSIS — Z1389 Encounter for screening for other disorder: Secondary | ICD-10-CM | POA: Diagnosis not present

## 2018-01-06 DIAGNOSIS — Z0000 Encounter for general adult medical examination without abnormal findings: Secondary | ICD-10-CM | POA: Diagnosis not present

## 2018-01-06 DIAGNOSIS — N183 Chronic kidney disease, stage 3 (moderate): Secondary | ICD-10-CM | POA: Diagnosis not present

## 2018-01-06 DIAGNOSIS — I509 Heart failure, unspecified: Secondary | ICD-10-CM | POA: Diagnosis not present

## 2018-01-06 DIAGNOSIS — I4891 Unspecified atrial fibrillation: Secondary | ICD-10-CM | POA: Diagnosis not present

## 2018-01-06 DIAGNOSIS — D692 Other nonthrombocytopenic purpura: Secondary | ICD-10-CM | POA: Diagnosis not present

## 2018-01-06 DIAGNOSIS — Z7689 Persons encountering health services in other specified circumstances: Secondary | ICD-10-CM | POA: Diagnosis not present

## 2018-01-06 DIAGNOSIS — E559 Vitamin D deficiency, unspecified: Secondary | ICD-10-CM | POA: Diagnosis not present

## 2018-01-06 DIAGNOSIS — E7849 Other hyperlipidemia: Secondary | ICD-10-CM | POA: Diagnosis not present

## 2018-01-06 DIAGNOSIS — E038 Other specified hypothyroidism: Secondary | ICD-10-CM | POA: Diagnosis not present

## 2018-01-23 DIAGNOSIS — M545 Low back pain: Secondary | ICD-10-CM | POA: Diagnosis not present

## 2018-03-03 ENCOUNTER — Other Ambulatory Visit: Payer: Self-pay | Admitting: Cardiology

## 2018-03-03 NOTE — Telephone Encounter (Signed)
Rx sent to pharmacy   

## 2018-05-11 DIAGNOSIS — M546 Pain in thoracic spine: Secondary | ICD-10-CM | POA: Diagnosis not present

## 2018-05-11 DIAGNOSIS — Z6833 Body mass index (BMI) 33.0-33.9, adult: Secondary | ICD-10-CM | POA: Diagnosis not present

## 2018-05-26 DIAGNOSIS — L720 Epidermal cyst: Secondary | ICD-10-CM | POA: Diagnosis not present

## 2018-05-26 DIAGNOSIS — L821 Other seborrheic keratosis: Secondary | ICD-10-CM | POA: Diagnosis not present

## 2018-05-26 DIAGNOSIS — Z85828 Personal history of other malignant neoplasm of skin: Secondary | ICD-10-CM | POA: Diagnosis not present

## 2018-05-26 DIAGNOSIS — L738 Other specified follicular disorders: Secondary | ICD-10-CM | POA: Diagnosis not present

## 2018-05-26 DIAGNOSIS — L570 Actinic keratosis: Secondary | ICD-10-CM | POA: Diagnosis not present

## 2018-06-12 DIAGNOSIS — Z23 Encounter for immunization: Secondary | ICD-10-CM | POA: Diagnosis not present

## 2018-07-04 ENCOUNTER — Other Ambulatory Visit: Payer: Self-pay | Admitting: Cardiology

## 2018-08-03 NOTE — Progress Notes (Signed)
Cheyenne Cruz Date of Birth: 10/10/1928 Medical Record #[id number]  History of Present Illness: Cheyenne Cruz is seen for follow up of atrial fibrillation and CHF. She was seen in 2011 with symptoms of dyspnea. She had an Echo at that time that showed Normal LV function, AV sclerosis, and mild MR. A stress Cruz showed very poor exercise tolerance. She had a cardiac cath that showed no CAD and normal right heart pressures.She had pulmonary evaluation at that time with Dr. Gwenette Greet with normal PFTs.  In 2015 she was found to be in atrial fibrillation.  She was asymptomatic.   Started on metoprolol and Xarelto. She later developed findings of diastolic CHF. She was placed on lasix with improvement.    She was seen by Dr Mariea Clonts on 10/18/16 and noted to have weight gain, increased edema and marked increase in SOB. Lasix increased to 40 mg bid. She was seen and noted that she could not do anything due to SOB and marked fatigue. No cough or chest pain. Marked edema. When in our office lasix increased further to 60 mg bid and potassium added.  Echo performed on 11/07/16 showed low normal LV function with moderate biatrial enlargment. No significant valvular disease. When seen in March 2018 she was doing much better with a 12 lb weight loss. Metolazone was changed to prn. Home oxygen was discontinued.   On her last visit in May she had increased swelling and significant weight gain. We recommended increasing her lasix to 40 mg bid.   On follow up today she is doing well. Her weight is down 3 lbs and her breathing is good. She has no edema now and has reduced her potassium and lasix to once a day. She is mainly concerned about her husband who she says has a significant decline in cognitive function. Fortunately they are at [facility] and have a lot of family support. They have sold their [place] home.    Allergies as of 08/04/2018   No Known Allergies     Medication List       Accurate as of August 03, 2018   9:46 AM. Always use your most recent med list.        atorvastatin 10 MG tablet Commonly known as:  LIPITOR Take 1 tablet (10 mg total) by mouth daily.   calcium carbonate 600 MG Tabs tablet Commonly known as:  OS-CAL Take 600 mg by mouth. Take one tablet twice daily   Fish Oil 1000 MG Caps Take 1 capsule by mouth daily. Take 1 capsule once a day.   furosemide 40 MG tablet Commonly known as:  LASIX Take 1 tablet (40 mg total) by mouth 2 (two) times daily.   levothyroxine 50 MCG tablet Commonly known as:  SYNTHROID, LEVOTHROID Take 1 tablet (50 mcg total) by mouth daily.   metoprolol tartrate 25 MG tablet Commonly known as:  LOPRESSOR Take 1 tablet (25 mg total) by mouth 2 (two) times daily.   multivitamin tablet Take 1 tablet by mouth daily. Take 1 tablet once a day.   potassium chloride SA 20 MEQ tablet Commonly known as:  K-DUR,KLOR-CON take 1 tablet by mouth twice a day   rivaroxaban 20 MG Tabs tablet Commonly known as:  XARELTO Take 1 tablet (20 mg total) by mouth daily with supper.   XARELTO 20 MG Tabs tablet Generic drug:  rivaroxaban TAKE 1 TABLET BY MOUTH ONCE DAILY WITH  SUPPER   Vitamin D-3 25 MCG (1000 UT) Caps Take 1  capsule by mouth daily.       No Known Allergies  Past Medical History:  Diagnosis Date  . Abnormality of gait 10/09/2008  . Aortic valve sclerosis   . Atrial fibrillation, persistent ([place])   . Closed fracture of shaft of femur ([place]) 11.14/2012  . Enthesopathy of unspecified site 03/19/2009  . Fatigue   . H/O total hysterectomy   . Hyperlipidemia   . Hypertension   . MVP (mitral valve prolapse)   . Myalgia and myositis, unspecified 08/06/2010  . Nonspecific abnormal results of pulmonary system function study 08/06/2010  . Nontoxic uninodular goiter 08/06/2010  . OA (osteoarthritis)   . Obesity   . Osteoporosis, senile 08/15/2004  . Other abnormal blood chemistry 06/25/2010  . Overweight and obesity(278.0) 11/11/2012  . Pain in  joint, ankle and foot 12/28/2008  . Plantar fasciitis   . Shortness of breath    WITH EXERTION  . Thyroid disease   . Unspecified asthma, with exacerbation 12/02/2005  . Unspecified essential hypertension 08/15/2004  . Unspecified hypothyroidism 08/06/2010  . Vitamin D deficiency     Past Surgical History:  Procedure Laterality Date  . ABDOMINAL HYSTERECTOMY  2001   Total Dr. Rexford Maus  . BASAL CELL CARCINOMA EXCISION  2006   Above left side of lip  . CATARACT EXTRACTION W/ INTRAOCULAR LENS  IMPLANT, BILATERAL  08/2015   Dr. Maricela Curet  . COLONOSCOPY  09/27/2008   Dr. Sammuel Cooper normal  . [place]  . [place]  . FRACTURE SURGERY Left 2012   hip ORIF  . SKIN CANCER EXCISION  2003   NOSE  . TONSILLECTOMY      Social History   Socioeconomic History  . Marital status: Married    Spouse name: Not on file  . Number of children: Not on file  . Years of education: Not on file  . Highest education level: Not on file  Occupational History  . Occupation: RETIRED Engineer, structural    Comment: Automotive engineer  Social Needs  . Financial resource strain: Not on file  . Food insecurity:    Worry: Not on file    Inability: Not on file  . Transportation needs:    Medical: Not on file    Non-medical: Not on file  Tobacco Use  . Smoking status: Never Smoker  . Smokeless tobacco: Never Used  Substance and Sexual Activity  . Alcohol use: Yes    Alcohol/week: 3.0 - 4.0 standard drinks    Types: 3 - 4 Glasses of wine per week    Comment: OCCASIONAL GLASS OF WINE  . Drug use: No  . Sexual activity: Not Currently  Lifestyle  . Physical activity:    Days per week: Not on file    Minutes per session: Not on file  . Stress: Not on file  Relationships  . Social connections:    Talks on phone: Not on file    Gets together: Not on file    Attends religious service: Not on file    Active member of club or organization: Not on file    Attends meetings  of clubs or organizations: Not on file    Relationship status: Not on file  Other Topics Concern  . Not on file  Social History Narrative   Married 1954, [name].    Retired Pharmacist, hospital.    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 Spends summers in [place].  No smoking history    drinks minimal amount of alcohol.   Exercise predominantly weight training, walking dog   DNR    Family History  Problem Relation Age of Onset  . Stroke Mother 75       FOLLOWING A BROKEN HIP  . Stroke Father   . Aneurysm Father        Abdominal aortic, repaired    Review of Systems: As noted in HPI.  All other systems were reviewed and are negative.  Physical Exam: There were no vitals taken for this visit. There were no vitals filed for this visit.  GENERAL:  Well appearing, overweight WF in NAD HEENT:  PERRL, EOMI, sclera are clear. Oropharynx is clear. NECK:  No jugular venous distention, carotid upstroke brisk and symmetric, no bruits, no thyromegaly or adenopathy LUNGS:  Clear to auscultation bilaterally CHEST:  Unremarkable HEART:  IRRR,  PMI not displaced or sustained,S1 and S2 within normal limits, no S3, no S4: no clicks, no rubs, no murmurs ABD:  Soft, nontender. BS +, no masses or bruits. No hepatomegaly, no splenomegaly EXT:  2 + pulses throughout, no edema, no cyanosis no clubbing SKIN:  Warm and dry.  No rashes NEURO:  Alert and oriented x 3. Cranial nerves II through XII intact. PSYCH:  Cognitively intact     LABORATORY DATA: Lab Results  Component Value Date   WBC 6.6 09/13/2016   HGB 12.2 09/13/2016   HCT 36.0 09/13/2016   PLT 290 09/13/2016   GLUCOSE 142 (H) 11/04/2016   CHOL 178 05/07/2016   TRIG 85 05/07/2016   HDL 87 (A) 05/07/2016   LDLCALC 74 05/07/2016   ALT 15 06/13/2015   AST 19 06/13/2015   NA 140 11/04/2016   K 4.3 11/04/2016   CL 98 11/04/2016   CREATININE 0.87 11/04/2016   BUN 18 11/04/2016   CO2 32 (H)  11/04/2016   TSH 3.63 05/07/2016   HGBA1C 6.0 05/07/2016   Labs dated 01/01/17: cholesterol 181, triglycerides 83, HDL 55, LDL 109. CMET, TSH, and CBC normal.  Dated 01/05/18: cholesterol 186, triglycerides 71, HDL 61, LDL 111. Creatinine 1.1. other chemistries and CBC normal  Ecg today shows Afib with rate 65. Old septal infarct. I have personally reviewed and interpreted this study.   Echo 11/07/16: Study Conclusions  - Left ventricle: The cavity size was normal. Wall thickness was   normal. Systolic function was normal. The estimated ejection   fraction was in the range of 50% to 55%. Wall motion was normal;   there were no regional wall motion abnormalities. - Aortic valve: There was trivial regurgitation. - Mitral valve: Systolic bowing without prolapse. - Left atrium: The atrium was moderately dilated. - Right atrium: The atrium was moderately dilated. - Atrial septum: No defect or patent foramen ovale was identified.  Assessment / Plan: 1. Atrial fibrillation chronic.  Rate is  well controlled on metoprolol.  Anticoagulated with Xarelto with elevated CHAD-vasc score of 4. Continue current therapy  2. HTN controlled. On metoprolol and lasix.  3.  Chronic diastolic CHF. She appears euvolemic today.  Weight is down and she is asymptomatic.  She has reduced her lasix use. Take potassium daily. Sodium restriction.   4. Hypercholesterolemia.     I will follow up in 6 months

## 2018-08-04 ENCOUNTER — Encounter: Payer: Self-pay | Admitting: Cardiology

## 2018-08-04 ENCOUNTER — Ambulatory Visit (INDEPENDENT_AMBULATORY_CARE_PROVIDER_SITE_OTHER): Payer: Medicare Other | Admitting: Cardiology

## 2018-08-04 VITALS — BP 130/80 | HR 65 | Ht 62.0 in | Wt 184.2 lb

## 2018-08-04 DIAGNOSIS — I5032 Chronic diastolic (congestive) heart failure: Secondary | ICD-10-CM | POA: Diagnosis not present

## 2018-08-04 DIAGNOSIS — I482 Chronic atrial fibrillation, unspecified: Secondary | ICD-10-CM | POA: Diagnosis not present

## 2018-08-04 DIAGNOSIS — I10 Essential (primary) hypertension: Secondary | ICD-10-CM | POA: Diagnosis not present

## 2018-08-08 ENCOUNTER — Other Ambulatory Visit: Payer: Self-pay | Admitting: Cardiology

## 2018-08-20 ENCOUNTER — Ambulatory Visit (INDEPENDENT_AMBULATORY_CARE_PROVIDER_SITE_OTHER): Payer: Medicare Other

## 2018-08-20 ENCOUNTER — Ambulatory Visit (INDEPENDENT_AMBULATORY_CARE_PROVIDER_SITE_OTHER): Payer: Medicare Other | Admitting: Orthopedic Surgery

## 2018-08-20 ENCOUNTER — Encounter (INDEPENDENT_AMBULATORY_CARE_PROVIDER_SITE_OTHER): Payer: Self-pay | Admitting: Orthopedic Surgery

## 2018-08-20 VITALS — Ht 62.0 in | Wt 184.2 lb

## 2018-08-20 DIAGNOSIS — M79672 Pain in left foot: Secondary | ICD-10-CM | POA: Diagnosis not present

## 2018-08-20 DIAGNOSIS — M170 Bilateral primary osteoarthritis of knee: Secondary | ICD-10-CM

## 2018-08-20 MED ORDER — COLCHICINE 0.6 MG PO CAPS: 0.6000 mg | ORAL_CAPSULE | Freq: Every day | ORAL | 1 refills | Status: DC | PRN

## 2018-08-20 MED ORDER — ALLOPURINOL 100 MG PO TABS: 100.0000 mg | ORAL_TABLET | Freq: Every day | ORAL | 3 refills | Status: DC

## 2018-08-21 ENCOUNTER — Telehealth (INDEPENDENT_AMBULATORY_CARE_PROVIDER_SITE_OTHER): Payer: Self-pay

## 2018-08-21 LAB — URIC ACID: URIC ACID, SERUM: 7 mg/dL (ref 2.5–7.0)

## 2018-08-21 NOTE — Telephone Encounter (Signed)
-----   Message from Newt Minion, MD sent at 08/21/2018  2:22 PM EST ----- Yes she has gout take meds as perscribed

## 2018-08-21 NOTE — Telephone Encounter (Signed)
I called pt to advise oflab results. She will continue with her medicine as directed from yesterdays visit. would like to follow up in the office in 1 month. appt made for 09/21/18

## 2018-08-25 ENCOUNTER — Encounter (INDEPENDENT_AMBULATORY_CARE_PROVIDER_SITE_OTHER): Payer: Self-pay | Admitting: Orthopedic Surgery

## 2018-08-25 NOTE — Progress Notes (Signed)
Office Visit Note   Patient: Cheyenne Cruz           Date of Birth: 1929-03-01           MRN: [id number] Visit Date: 08/20/2018              Requested by: Velna Hatchet, MD [address] [place], [place] [address] PCP: Velna Hatchet, MD  Chief Complaint  Patient presents with  . Left Foot - Pain      HPI: Patient is an 85 year old woman with no history of diabetes no history of gout who presents with acute pain and redness and swelling across the left midfoot she states it is been present for about a week.  Patient states she has had previous trauma to her midfoot and previously had internal fixation she states she currently has no hardware.  Assessment & Plan: Visit Diagnoses:  1. Left foot pain   2. Primary osteoarthritis of both knees     Plan: Patient has clinical signs and symptoms of gout we will start her on allopurinol and colchicine and will draw a uric acid and follow-up.  We will call her with the results.  Follow-Up Instructions: Return in about 4 weeks (around 09/17/2018).   Ortho Exam  Patient is alert, oriented, no adenopathy, well-dressed, normal affect, normal respiratory effort. Examination patient has a palpable pulse she has redness pain and swelling over the medial aspect of the midfoot left foot.  Patient has no open wounds no ulcers no clinical signs of infection.  She states she is currently on Lasix.  Patient denies any acute trauma states that she is started developing the redness pain and swelling without trauma.  Imaging: No results found. No images are attached to the encounter.  Labs: Lab Results  Component Value Date   HGBA1C 6.0 05/07/2016   HGBA1C 6.1 (A) 06/13/2015   LABURIC 7.0 08/20/2018     Lab Results  Component Value Date   LABURIC 7.0 08/20/2018    Body mass index is 33.69 kg/m.  Orders:  Orders Placed This Encounter  Procedures  . XR Foot Complete Left  . Uric acid   Meds ordered this encounter    Medications  . allopurinol (ZYLOPRIM) 100 MG tablet    Sig: Take 1 tablet (100 mg total) by mouth daily.    Dispense:  60 tablet    Refill:  3  . Colchicine 0.6 MG CAPS    Sig: Take 0.6 mg by mouth daily as needed.    Dispense:  60 capsule    Refill:  1     Procedures: No procedures performed  Clinical Data: No additional findings.  ROS:  All other systems negative, except as noted in the HPI. Review of Systems  Objective: Vital Signs: Ht 5\' 2"  (1.575 m)   Wt 184 lb 3.2 oz (83.6 kg)   BMI 33.69 kg/m   Specialty Comments:  No specialty comments available.  PMFS History: Patient Active Problem List   Diagnosis Date Noted  . Acute on chronic diastolic CHF (congestive heart failure) ([place]) 10/28/2016  . Chronic anticoagulation- Eliquis, CHADS2VASC=4 10/28/2016  . Mild intermittent asthma 10/13/2016  . Diastolic CHF ([place]) 96/29/5284  . Osteoarthritis 07/20/2014  . Atrial fibrillation ([place]) 07/06/2014  . Healthcare maintenance 12/22/2013  . Hyperglycemia 01/06/2013  . Overweight and obesity(278.0) 11/11/2012  . Dyspnea 08/29/2010  . Hypothyroidism 08/28/2010  . HYPERCHOLESTEROLEMIA 08/28/2010  . Essential hypertension 08/28/2010  . SENILE OSTEOPOROSIS 08/28/2010  . SHORTNESS OF BREATH  08/28/2010   Past Medical History:  Diagnosis Date  . Abnormality of gait 10/09/2008  . Aortic valve sclerosis   . Atrial fibrillation, persistent   . Closed fracture of shaft of femur ([place]) 11.14/2012  . Enthesopathy of unspecified site 03/19/2009  . Fatigue   . H/O total hysterectomy   . Hyperlipidemia   . Hypertension   . MVP (mitral valve prolapse)   . Myalgia and myositis, unspecified 08/06/2010  . Nonspecific abnormal results of pulmonary system function study 08/06/2010  . Nontoxic uninodular goiter 08/06/2010  . OA (osteoarthritis)   . Obesity   . Osteoporosis, senile 08/15/2004  . Other abnormal blood chemistry 06/25/2010  . Overweight and obesity(278.0) 11/11/2012  .  Pain in joint, ankle and foot 12/28/2008  . Plantar fasciitis   . Shortness of breath    WITH EXERTION  . Thyroid disease   . Unspecified asthma, with exacerbation 12/02/2005  . Unspecified essential hypertension 08/15/2004  . Unspecified hypothyroidism 08/06/2010  . Vitamin D deficiency     Family History  Problem Relation Age of Onset  . Stroke Mother 2       FOLLOWING A BROKEN HIP  . Stroke Father   . Aneurysm Father        Abdominal aortic, repaired    Past Surgical History:  Procedure Laterality Date  . ABDOMINAL HYSTERECTOMY  2001   Total Dr. Rexford Maus  . BASAL CELL CARCINOMA EXCISION  2006   Above left side of lip  . CATARACT EXTRACTION W/ INTRAOCULAR LENS  IMPLANT, BILATERAL  08/2015   Dr. Maricela Curet  . COLONOSCOPY  09/27/2008   Dr. Sammuel Cooper normal  . [place]  . [place]  . FRACTURE SURGERY Left 2012   hip ORIF  . SKIN CANCER EXCISION  2003   NOSE  . TONSILLECTOMY     Social History   Occupational History  . 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 Smoking status: Never Smoker  . Smokeless tobacco: Never Used  Substance and Sexual Activity  . Alcohol use: Yes    Alcohol/week: 3.0 - 4.0 standard drinks    Types: 3 - 4 Glasses of wine per week    Comment: OCCASIONAL GLASS OF WINE  . Drug use: No  . Sexual activity: Not Currently

## 2018-09-21 ENCOUNTER — Encounter (INDEPENDENT_AMBULATORY_CARE_PROVIDER_SITE_OTHER): Payer: Self-pay | Admitting: Orthopedic Surgery

## 2018-09-21 ENCOUNTER — Ambulatory Visit (INDEPENDENT_AMBULATORY_CARE_PROVIDER_SITE_OTHER): Payer: Medicare Other | Admitting: Physician Assistant

## 2018-09-21 VITALS — Ht 62.0 in | Wt 184.2 lb

## 2018-09-21 DIAGNOSIS — M79672 Pain in left foot: Secondary | ICD-10-CM | POA: Diagnosis not present

## 2018-09-21 DIAGNOSIS — M1A0720 Idiopathic chronic gout, left ankle and foot, without tophus (tophi): Secondary | ICD-10-CM | POA: Diagnosis not present

## 2018-09-22 LAB — URIC ACID: Uric Acid, Serum: 5.7 mg/dL (ref 2.5–7.0)

## 2018-09-23 ENCOUNTER — Encounter (INDEPENDENT_AMBULATORY_CARE_PROVIDER_SITE_OTHER): Payer: Self-pay | Admitting: Physician Assistant

## 2018-09-23 NOTE — Progress Notes (Signed)
Office Visit Note   Patient: Cheyenne Cruz           Date of Birth: 11/09/1928           MRN: [id number] Visit Date: 09/21/2018              Requested by: Velna Hatchet, MD [address] [place] [facility] [place], [place] [address] PCP: Velna Hatchet, MD  Chief Complaint  Patient presents with  . Left Foot - Pain, Follow-up      HPI: The patient is a 85 year old woman who is seen for follow-up of her left midfoot pain and swelling and erythema.  She was suspected to have gout and was started on colchicine and allopurinol.  Her uric acid level was 7.  She has been taking the colchicine and allopurinol daily and reports that the swelling erythema and pain over her foot is markedly improved.  She reports only very mild discomfort currently.  Assessment & Plan: Visit Diagnoses:  1. Idiopathic chronic gout of left foot without tophus   2. Left foot pain     Plan: Advised the patient to stop the colchicine at this point but to continue her allopurinol daily.  Will have her follow-up in 3 months with a repeat uric acid level at this time.  Follow-Up Instructions: Return in about 3 months (around 12/20/2018).   Ortho Exam  Patient is alert, oriented, no adenopathy, well-dressed, normal affect, normal respiratory effort. The left foot edema and erythema are markedly improved.  She is nontender to palpation and ambulates weightbearing as tolerated in her regular shoe.  She has good pedal pulses.  No signs of cellulitis or infection.  Imaging: No results found. No images are attached to the encounter.  Labs: Lab Results  Component Value Date   HGBA1C 6.0 05/07/2016   HGBA1C 6.1 (A) 06/13/2015   LABURIC 5.7 09/21/2018   LABURIC 7.0 08/20/2018     Lab Results  Component Value Date   LABURIC 5.7 09/21/2018   LABURIC 7.0 08/20/2018    Body mass index is 33.69 kg/m.  Orders:  Orders Placed This Encounter  Procedures  . Uric acid   No orders of the defined types were placed in this  encounter.    Procedures: No procedures performed  Clinical Data: No additional findings.  ROS:  All other systems negative, except as noted in the HPI. Review of Systems  Objective: Vital Signs: Ht 5\' 2"  (1.575 m)   Wt 184 lb 3.2 oz (83.6 kg)   BMI 33.69 kg/m   Specialty Comments:  No specialty comments available.  PMFS History: Patient Active Problem List   Diagnosis Date Noted  . Acute on chronic diastolic CHF (congestive heart failure) ([facility]) 10/28/2016  . Chronic anticoagulation- Eliquis, CHADS2VASC=4 10/28/2016  . Mild intermittent asthma 10/13/2016  . Diastolic CHF ([place]) 51/09/5850  . Osteoarthritis 07/20/2014  . Atrial fibrillation ([place]) 07/06/2014  . Healthcare maintenance 12/22/2013  . Hyperglycemia 01/06/2013  . Overweight and obesity(278.0) 11/11/2012  . Dyspnea 08/29/2010  . Hypothyroidism 08/28/2010  . HYPERCHOLESTEROLEMIA 08/28/2010  . Essential hypertension 08/28/2010  . SENILE OSTEOPOROSIS 08/28/2010  . SHORTNESS OF BREATH 08/28/2010   Past Medical History:  Diagnosis Date  . Abnormality of gait 10/09/2008  . Aortic valve sclerosis   . Atrial fibrillation, persistent   . Closed fracture of shaft of femur ([place]) 11.14/2012  . Enthesopathy of unspecified site 03/19/2009  . Fatigue   . H/O total hysterectomy   . Hyperlipidemia   . Hypertension   .  MVP (mitral valve prolapse)   . Myalgia and myositis, unspecified 08/06/2010  . Nonspecific abnormal results of pulmonary system function study 08/06/2010  . Nontoxic uninodular goiter 08/06/2010  . OA (osteoarthritis)   . Obesity   . Osteoporosis, senile 08/15/2004  . Other abnormal blood chemistry 06/25/2010  . Overweight and obesity(278.0) 11/11/2012  . Pain in joint, ankle and foot 12/28/2008  . Plantar fasciitis   . Shortness of breath    WITH EXERTION  . Thyroid disease   . Unspecified asthma, with exacerbation 12/02/2005  . Unspecified essential hypertension 08/15/2004  . Unspecified  hypothyroidism 08/06/2010  . Vitamin D deficiency     Family History  Problem Relation Age of Onset  . Stroke Mother 65       FOLLOWING A BROKEN HIP  . Stroke Father   . Aneurysm Father        Abdominal aortic, repaired    Past Surgical History:  Procedure Laterality Date  . ABDOMINAL HYSTERECTOMY  2001   Total Dr. Rexford Maus  . BASAL CELL CARCINOMA EXCISION  2006   Above left side of lip  . CATARACT EXTRACTION W/ INTRAOCULAR LENS  IMPLANT, BILATERAL  08/2015   Dr. Maricela Curet  . COLONOSCOPY  09/27/2008   Dr. Sammuel Cooper normal  . [place]  . [place]  . FRACTURE SURGERY Left 2012   hip ORIF  . SKIN CANCER EXCISION  2003   NOSE  . TONSILLECTOMY     Social History   Occupational History  . 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 Smoking status: Never Smoker  . Smokeless tobacco: Never Used  Substance and Sexual Activity  . Alcohol use: Yes    Alcohol/week: 3.0 - 4.0 standard drinks    Types: 3 - 4 Glasses of wine per week    Comment: OCCASIONAL GLASS OF WINE  . Drug use: No  . Sexual activity: Not Currently

## 2018-10-01 NOTE — Progress Notes (Signed)
LMOM for patient of the below results

## 2018-10-09 ENCOUNTER — Other Ambulatory Visit: Payer: Self-pay | Admitting: Cardiology

## 2018-10-09 MED ORDER — RIVAROXABAN 15 MG PO TABS: 15.0000 mg | ORAL_TABLET | Freq: Every day | ORAL | 1 refills | Status: DC

## 2018-10-09 NOTE — Telephone Encounter (Signed)
°*  STAT* If patient is at the pharmacy, call can be transferred to refill team.   1. Which medications need to be refilled? (please list name of each medication and dose if known) xarelto 25 mg  2. Which pharmacy/location (including street and city if local pharmacy) is medication to be sent to? [facility] [place]  3. Do they need a 30 day or 90 day supply? [place]

## 2018-10-09 NOTE — Telephone Encounter (Signed)
Patient age 85, weight 83.6 kg, SCr 1.2  = CrCl at 47.6,  Switched patient to 15 mg tablets due to CrCl < 50.  Spoke with patient and she is aware.

## 2018-12-28 ENCOUNTER — Ambulatory Visit (INDEPENDENT_AMBULATORY_CARE_PROVIDER_SITE_OTHER): Payer: Medicare Other | Admitting: Orthopedic Surgery

## 2018-12-28 ENCOUNTER — Encounter: Payer: Self-pay | Admitting: Orthopedic Surgery

## 2018-12-28 ENCOUNTER — Other Ambulatory Visit: Payer: Self-pay

## 2018-12-28 VITALS — Ht 62.0 in | Wt 184.0 lb

## 2018-12-28 DIAGNOSIS — M1A0720 Idiopathic chronic gout, left ankle and foot, without tophus (tophi): Secondary | ICD-10-CM

## 2018-12-28 DIAGNOSIS — M79672 Pain in left foot: Secondary | ICD-10-CM | POA: Diagnosis not present

## 2018-12-28 LAB — URIC ACID: Uric Acid, Serum: 6.7 mg/dL (ref 2.5–7.0)

## 2018-12-28 NOTE — Progress Notes (Signed)
Office Visit Note   Patient: Cheyenne Cruz           Date of Birth: [birth year]           MRN: [id number] Visit Date: 12/28/2018              Requested by: Velna Hatchet, MD [address] [place], [place] [address] PCP: Velna Hatchet, MD  Chief Complaint  Patient presents with  . Left Foot - Follow-up      HPI: The patient is a 85 yo woman who is seen for follow up of left foot midfoot pain and swelling and erythema. Her pain resolved following a course of colchicine and she was started on Allopurinol. Her initial uric acid level was 7. She reports she has had no further episodes and she has not been taking any medications for the foot now. She is wearing her regular shoes.  She reports she has tried to decrease the animal products somewhat in her diet.   Assessment & Plan: Visit Diagnoses:  1. 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 She would prefer to try and avoid taking medications regularly for this if possible.   Follow-Up Instructions: Return in about 1 year (around 12/28/2019).   Ortho Exam  Patient is alert, oriented, no adenopathy, well-dressed, normal affect, normal respiratory effort. She ambulates without antalgic component or gait abnormality. The left foot is plantigrade and without edema, no erythema, no increased warmth. Non tender to palpation. Good pedal pulses.   Imaging: No results found. No images are attached to the encounter.  Labs: Lab Results  Component Value Date   HGBA1C 6.0 05/07/2016   HGBA1C 6.1 (A) 06/13/2015   LABURIC 5.7 09/21/2018   LABURIC 7.0 08/20/2018     Lab Results  Component Value Date   LABURIC 5.7 09/21/2018   LABURIC 7.0 08/20/2018    Body mass index is 33.65 kg/m.  Orders:  No orders of the defined types were placed in this encounter.  No orders of the defined types were placed in this encounter.    Procedures: No procedures performed  Clinical Data: No additional findings.  ROS:  All other systems negative, except as noted in the HPI. Review of Systems  Objective: Vital Signs: Ht 5\' 2"  (1.575 m)   Wt 184 lb (83.5 kg)   BMI 33.65 kg/m   Specialty Comments:  No specialty comments available.  PMFS History: Patient Active Problem List   Diagnosis Date Noted  . Acute on chronic diastolic CHF (congestive heart failure) ([place]) 10/28/2016  . Chronic anticoagulation- Eliquis, CHADS2VASC=4 10/28/2016  . Mild intermittent asthma 10/13/2016  . Diastolic CHF ([place]) 56/43/3295  . Osteoarthritis 07/20/2014  . Atrial fibrillation ([place]) 07/06/2014  . Healthcare maintenance 12/22/2013  . Hyperglycemia 01/06/2013  . Overweight and obesity(278.0) 11/11/2012  . Dyspnea 08/29/2010  . Hypothyroidism 08/28/2010  . HYPERCHOLESTEROLEMIA 08/28/2010  . Essential hypertension 08/28/2010  . SENILE OSTEOPOROSIS 08/28/2010  . SHORTNESS OF BREATH 08/28/2010   Past Medical History:  Diagnosis Date  . Abnormality of gait 10/09/2008  . Aortic valve sclerosis   . Atrial fibrillation, persistent   . Closed fracture of shaft of femur ([place]) 11.14/2012  . Enthesopathy of unspecified site 03/19/2009  . Fatigue   . H/O total hysterectomy   . Hyperlipidemia   . Hypertension   . MVP (mitral valve prolapse)   . Myalgia and myositis,  unspecified 08/06/2010  . Nonspecific abnormal results of pulmonary system function study 08/06/2010  . Nontoxic uninodular goiter 08/06/2010  . OA (osteoarthritis)   . Obesity   . Osteoporosis, senile 08/15/2004  . Other abnormal blood chemistry 06/25/2010  . Overweight and obesity(278.0) 11/11/2012  . Pain in joint, ankle and foot 12/28/2008  . Plantar fasciitis   . Shortness of breath    WITH EXERTION  . Thyroid disease   . Unspecified asthma, with exacerbation 12/02/2005  . Unspecified essential hypertension 08/15/2004  . Unspecified hypothyroidism 08/06/2010  .  Vitamin D deficiency     Family History  Problem Relation Age of Onset  . Stroke Mother 22       FOLLOWING A BROKEN HIP  . Stroke Father   . Aneurysm Father        Abdominal aortic, repaired    Past Surgical History:  Procedure Laterality Date  . ABDOMINAL HYSTERECTOMY  2001   Total Dr. Rexford Maus  . BASAL CELL CARCINOMA EXCISION  2006   Above left side of lip  . CATARACT EXTRACTION W/ INTRAOCULAR LENS  IMPLANT, BILATERAL  08/2015   Dr. Maricela Curet  . COLONOSCOPY  09/27/2008   Dr. Sammuel Cooper normal  . [place]  . [place]  . FRACTURE SURGERY Left 2012   hip ORIF  . SKIN CANCER EXCISION  2003   NOSE  . TONSILLECTOMY     Social History   Occupational History  . 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 Smoking status: Never Smoker  . Smokeless tobacco: Never Used  Substance and Sexual Activity  . Alcohol use: Yes    Alcohol/week: 3.0 - 4.0 standard drinks    Types: 3 - 4 Glasses of wine per week    Comment: OCCASIONAL GLASS OF WINE  . Drug use: No  . Sexual activity: Not Currently

## 2018-12-28 NOTE — Addendum Note (Signed)
Addended by: Lorayne Bender on: 12/28/2018 10:28 AM   Modules accepted: Orders

## 2018-12-30 ENCOUNTER — Telehealth: Payer: Self-pay

## 2018-12-30 NOTE — Telephone Encounter (Signed)
Called pt to advise of lab result. Voiced understanding and will call with any questions.

## 2018-12-30 NOTE — Telephone Encounter (Signed)
-----   Message from Newt Minion, MD sent at 12/30/2018 11:12 AM EDT ----- Call patient.  Her uric acid is in a good range at 6.7.  She should continue with her diet modifications to decrease her uric acid level.

## 2019-01-12 ENCOUNTER — Other Ambulatory Visit: Payer: Self-pay | Admitting: Cardiology

## 2019-01-12 DIAGNOSIS — E7849 Other hyperlipidemia: Secondary | ICD-10-CM | POA: Diagnosis not present

## 2019-01-12 DIAGNOSIS — E038 Other specified hypothyroidism: Secondary | ICD-10-CM | POA: Diagnosis not present

## 2019-01-12 DIAGNOSIS — R7301 Impaired fasting glucose: Secondary | ICD-10-CM | POA: Diagnosis not present

## 2019-01-12 DIAGNOSIS — E559 Vitamin D deficiency, unspecified: Secondary | ICD-10-CM | POA: Diagnosis not present

## 2019-01-12 MED ORDER — FUROSEMIDE 40 MG PO TABS: 40.0000 mg | ORAL_TABLET | Freq: Two times a day (BID) | ORAL | 1 refills | Status: DC

## 2019-01-12 NOTE — Telephone Encounter (Signed)
°*  STAT* If patient is at the pharmacy, call can be transferred to refill team.   1. Which medications need to be refilled? (please list name of each medication and dose if known) furosemide (LASIX) 40 MG tablet  2. Which pharmacy/location (including street and city if local pharmacy) is medication to be sent to? [place], [place] - [address]  3. Do they need a 30 day or 90 day supply? 90 days

## 2019-01-12 NOTE — Telephone Encounter (Signed)
Rx request sent to pharmacy.  

## 2019-01-13 DIAGNOSIS — I4891 Unspecified atrial fibrillation: Secondary | ICD-10-CM | POA: Diagnosis not present

## 2019-01-13 DIAGNOSIS — Z0000 Encounter for general adult medical examination without abnormal findings: Secondary | ICD-10-CM | POA: Diagnosis not present

## 2019-01-13 DIAGNOSIS — Z7689 Persons encountering health services in other specified circumstances: Secondary | ICD-10-CM | POA: Diagnosis not present

## 2019-01-13 DIAGNOSIS — R600 Localized edema: Secondary | ICD-10-CM | POA: Diagnosis not present

## 2019-01-13 DIAGNOSIS — Z1331 Encounter for screening for depression: Secondary | ICD-10-CM | POA: Diagnosis not present

## 2019-01-13 DIAGNOSIS — E559 Vitamin D deficiency, unspecified: Secondary | ICD-10-CM | POA: Diagnosis not present

## 2019-01-13 DIAGNOSIS — M8580 Other specified disorders of bone density and structure, unspecified site: Secondary | ICD-10-CM | POA: Diagnosis not present

## 2019-01-13 DIAGNOSIS — I509 Heart failure, unspecified: Secondary | ICD-10-CM | POA: Diagnosis not present

## 2019-01-13 DIAGNOSIS — N183 Chronic kidney disease, stage 3 (moderate): Secondary | ICD-10-CM | POA: Diagnosis not present

## 2019-01-13 DIAGNOSIS — E039 Hypothyroidism, unspecified: Secondary | ICD-10-CM | POA: Diagnosis not present

## 2019-01-13 DIAGNOSIS — E785 Hyperlipidemia, unspecified: Secondary | ICD-10-CM | POA: Diagnosis not present

## 2019-01-13 DIAGNOSIS — D692 Other nonthrombocytopenic purpura: Secondary | ICD-10-CM | POA: Diagnosis not present

## 2019-02-05 ENCOUNTER — Ambulatory Visit: Payer: Medicare Other | Admitting: Cardiology

## 2019-02-09 ENCOUNTER — Other Ambulatory Visit: Payer: Self-pay

## 2019-02-09 ENCOUNTER — Encounter: Payer: Self-pay | Admitting: Orthopedic Surgery

## 2019-02-09 ENCOUNTER — Ambulatory Visit (INDEPENDENT_AMBULATORY_CARE_PROVIDER_SITE_OTHER): Payer: Medicare Other

## 2019-02-09 ENCOUNTER — Ambulatory Visit (INDEPENDENT_AMBULATORY_CARE_PROVIDER_SITE_OTHER): Payer: Medicare Other | Admitting: Physician Assistant

## 2019-02-09 VITALS — Ht 62.0 in | Wt 184.0 lb

## 2019-02-09 DIAGNOSIS — M546 Pain in thoracic spine: Secondary | ICD-10-CM

## 2019-02-09 DIAGNOSIS — G8929 Other chronic pain: Secondary | ICD-10-CM

## 2019-02-09 DIAGNOSIS — M545 Low back pain: Secondary | ICD-10-CM

## 2019-02-09 MED ORDER — TRAMADOL HCL 50 MG PO TABS: 50.0000 mg | ORAL_TABLET | Freq: Four times a day (QID) | ORAL | 0 refills | Status: DC | PRN

## 2019-02-09 MED ORDER — METHOCARBAMOL 500 MG PO TABS: 500.0000 mg | ORAL_TABLET | Freq: Four times a day (QID) | ORAL | 2 refills | Status: DC

## 2019-02-10 ENCOUNTER — Encounter: Payer: Self-pay | Admitting: Physician Assistant

## 2019-02-10 NOTE — Progress Notes (Signed)
Office Visit Note   Patient: Cheyenne Cruz           Date of Birth: 1928/12/13           MRN: [id number] Visit Date: 02/09/2019              Requested by: Velna Hatchet, MD [address] [place],  [place] [address] PCP: Velna Hatchet, MD  Chief Complaint  Patient presents with  . Lower Back - Pain      HPI: The patient is a 85 year old woman who is seen for evaluation of her mid back pain.  She reports that she has had intermittent episodes of mid back pain for the past several years.  She reports that the episodes only last for short period of time and then resolve on their own.  She notes that her most recent episode started a couple of weeks ago.  She reports that it feels like a muscle spasm.  She is on vitamin D and calcium supplementation.  She reports taking some Tylenol and alternating heat and cold to the area but this is not helped much.  She has no radicular type pain just pain localized to the mid to upper portion of her lumbar spine.  She reports she has utilize some lidocaine patches to the area and this does help.  Assessment & Plan: Visit Diagnoses:  1. Chronic midline thoracic back pain   2. Chronic midline low back pain without sciatica     Plan: We reviewed the patient's radiographs and discussed her significant arthritic changes particularly about the thoracic spine.  She was prescribed some Robaxin 500 mg 4 times daily as needed muscle spasm as well as tramadol 50 mg as needed severe pain only for judicious use for more severe pain.  We also discussed utilizing some Voltaren gel to the area up to 4 times daily.  She will continue on her usual vitamin D and calcium supplementation.  She will follow-up in several weeks for recheck.  Follow-Up Instructions: Return in about 3 weeks (around 03/02/2019).   Ortho Exam  Patient is alert, oriented, no adenopathy, well-dressed, normal affect, normal respiratory effort. The patient ambulates with a single-point cane  with minimal antalgic component.  She is tender to palpation over the midline of the lower thoracic spine without evidence of step-off.  She has good symmetric strength in the lower extremities and otherwise is without focal neurologic deficit.  She has significant dorsal kyphosis.  Imaging: Xr Thoracic Spine 2 View  Result Date: 02/10/2019 Radiographs of the thoracic spine show significant dorsal kyphosis without evidence of acute compression fractures.  She has severe osteophyte formation anteriorly particular about the lower thoracic spine.  Xr Lumbar Spine 2-3 Views  Result Date: 02/10/2019 Radiographs of the lumbar sacral spine show fairly good preservation of the disc space but arthritic changes with anterior osteophyte formation, no acute fractures appreciated.  No images are attached to the encounter.  Labs: Lab Results  Component Value Date   HGBA1C 6.0 05/07/2016   HGBA1C 6.1 (A) 06/13/2015   LABURIC 6.7 12/28/2018   LABURIC 5.7 09/21/2018   LABURIC 7.0 08/20/2018     Lab Results  Component Value Date   LABURIC 6.7 12/28/2018   LABURIC 5.7 09/21/2018   LABURIC 7.0 08/20/2018    Body mass index is 33.65 kg/m.  Orders:  Orders Placed This Encounter  Procedures  . XR Thoracic Spine 2 View  . XR Lumbar Spine 2-3 Views   Meds ordered  this encounter  Medications  . methocarbamol (ROBAXIN) 500 MG tablet    Sig: Take 1 tablet (500 mg total) by mouth 4 (four) times daily.    Dispense:  40 tablet    Refill:  2  . traMADol (ULTRAM) 50 MG tablet    Sig: Take 1 tablet (50 mg total) by mouth every 6 (six) hours as needed for severe pain.    Dispense:  20 tablet    Refill:  0     Procedures: No procedures performed  Clinical Data: No additional findings.  ROS:  All other systems negative, except as noted in the HPI. Review of Systems  Objective: Vital Signs: Ht 5\' 2"  (1.575 m)   Wt 184 lb (83.5 kg)   BMI 33.65 kg/m   Specialty Comments:  No specialty  comments available.  PMFS History: Patient Active Problem List   Diagnosis Date Noted  . Acute on chronic diastolic CHF (congestive heart failure) ([place]) 10/28/2016  . Chronic anticoagulation- Eliquis, CHADS2VASC=4 10/28/2016  . Mild intermittent asthma 10/13/2016  . Diastolic CHF ([place]) 09/81/1914  . Osteoarthritis 07/20/2014  . Atrial fibrillation ([facility]) 07/06/2014  . Healthcare maintenance 12/22/2013  . Hyperglycemia 01/06/2013  . Overweight and obesity(278.0) 11/11/2012  . Dyspnea 08/29/2010  . Hypothyroidism 08/28/2010  . HYPERCHOLESTEROLEMIA 08/28/2010  . Essential hypertension 08/28/2010  . SENILE OSTEOPOROSIS 08/28/2010  . SHORTNESS OF BREATH 08/28/2010   Past Medical History:  Diagnosis Date  . Abnormality of gait 10/09/2008  . Aortic valve sclerosis   . Atrial fibrillation, persistent   . Closed fracture of shaft of femur ([facility]) 11.14/2012  . Enthesopathy of unspecified site 03/19/2009  . Fatigue   . H/O total hysterectomy   . Hyperlipidemia   . Hypertension   . MVP (mitral valve prolapse)   . Myalgia and myositis, unspecified 08/06/2010  . Nonspecific abnormal results of pulmonary system function study 08/06/2010  . Nontoxic uninodular goiter 08/06/2010  . OA (osteoarthritis)   . Obesity   . Osteoporosis, senile 08/15/2004  . Other abnormal blood chemistry 06/25/2010  . Overweight and obesity(278.0) 11/11/2012  . Pain in joint, ankle and foot 12/28/2008  . Plantar fasciitis   . Shortness of breath    WITH EXERTION  . Thyroid disease   . Unspecified asthma, with exacerbation 12/02/2005  . Unspecified essential hypertension 08/15/2004  . Unspecified hypothyroidism 08/06/2010  . Vitamin D deficiency     Family History  Problem Relation Age of Onset  . Stroke Mother 15       FOLLOWING A BROKEN HIP  . Stroke Father   . Aneurysm Father        Abdominal aortic, repaired    Past Surgical History:  Procedure Laterality Date  . ABDOMINAL HYSTERECTOMY  2001   Total  Dr. Rexford Maus  . BASAL CELL CARCINOMA EXCISION  2006   Above left side of lip  . CATARACT EXTRACTION W/ INTRAOCULAR LENS  IMPLANT, BILATERAL  08/2015   Dr. Maricela Curet  . COLONOSCOPY  09/27/2008   Dr. Sammuel Cooper normal  . [place]  . [place]  . FRACTURE SURGERY Left 2012   hip ORIF  . SKIN CANCER EXCISION  2003   NOSE  . TONSILLECTOMY     Social History   Occupational History  . 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 Smoking status: Never Smoker  . Smokeless tobacco: Never Used  Substance and Sexual Activity  .  Alcohol use: Yes    Alcohol/week: 3.0 - 4.0 standard drinks    Types: 3 - 4 Glasses of wine per week    Comment: OCCASIONAL GLASS OF WINE  . Drug use: No  . Sexual activity: Not Currently

## 2019-03-01 ENCOUNTER — Other Ambulatory Visit: Payer: Self-pay | Admitting: Cardiology

## 2019-03-01 MED ORDER — METOPROLOL TARTRATE 25 MG PO TABS: 25.0000 mg | ORAL_TABLET | Freq: Two times a day (BID) | ORAL | 0 refills | Status: DC

## 2019-03-01 NOTE — Telephone Encounter (Signed)
New Message     *STAT* If patient is at the pharmacy, call can be transferred to refill team.   1. Which medications need to be refilled? (please list name of each medication and dose if known) Metoprolol Tartrate 25mg   2. Which pharmacy/location (including street and city if local pharmacy) is medication to be sent to? [facility] [address]  3. Do they need a 30 day or 90 day supply? 90 day supply

## 2019-03-01 NOTE — Telephone Encounter (Signed)
Metoprolol tartrate 25 mg twice daily refilled.

## 2019-03-19 ENCOUNTER — Telehealth: Payer: Self-pay | Admitting: Cardiology

## 2019-03-19 NOTE — Telephone Encounter (Signed)
LVM for patient to let us know if she wants a phone or video visit.

## 2019-03-23 DIAGNOSIS — Z961 Presence of intraocular lens: Secondary | ICD-10-CM | POA: Diagnosis not present

## 2019-03-23 DIAGNOSIS — H5212 Myopia, left eye: Secondary | ICD-10-CM | POA: Diagnosis not present

## 2019-04-07 ENCOUNTER — Other Ambulatory Visit: Payer: Self-pay | Admitting: Cardiology

## 2019-04-07 NOTE — Telephone Encounter (Signed)
Pt calling requesting a refill on Xarelto 15 mg tablet, sent to [facility] on [address]. Please address

## 2019-04-09 MED ORDER — RIVAROXABAN 15 MG PO TABS: 15.0000 mg | ORAL_TABLET | Freq: Every day | ORAL | 1 refills | Status: DC

## 2019-04-09 NOTE — Telephone Encounter (Signed)
89 F 83.5 kg SCr 1.1, CrCl 47.5

## 2019-04-14 NOTE — Progress Notes (Signed)
Virtual Visit via Telephone Note   This visit type was conducted due to national recommendations for restrictions regarding the COVID-19 Pandemic (e.g. social distancing) in an effort to limit this patient's exposure and mitigate transmission in our community.  Due to her co-morbid illnesses, this patient is at least at moderate risk for complications without adequate follow up.  This format is felt to be most appropriate for this patient at this time.  The patient did not have access to video technology/had technical difficulties with video requiring transitioning to audio format only (telephone).  All issues noted in this document were discussed and addressed.  No physical exam could be performed with this format.  Please refer to the patient's chart for her  consent to telehealth for [facility].   Date:  04/15/2019   ID:  [name], DOB 05/03/1929, MRN [id number]  Patient Location: Home Provider Location: Home  PCP:  Velna Hatchet, MD  Cardiologist:  Nikki Rusnak [place] MD Electrophysiologist:  None   Evaluation Performed:  Follow-Up Visit  Chief Complaint:  Follow up CHF and Afib  History of Present Illness:    Cheyenne Cruz is a 85 y.o. female with a history of atrial fibrillation and CHF. She was seen in 2011 with symptoms of dyspnea. She had an Echo at that time that showed Normal LV function, AV sclerosis, and mild MR. A stress test showed very poor exercise tolerance. She had a cardiac cath that showed no CAD and normal right heart pressures.She had pulmonary evaluation at that time with Dr. Gwenette Greet with normal PFTs.  In 2015 she was found to be in atrial fibrillation.  She was asymptomatic.   Started on metoprolol and Xarelto. She later developed findings of diastolic CHF. She was placed on lasix with improvement.   She was seen by Dr Mariea Clonts on 10/18/16 and noted to have weight gain, increased edema and marked increase in SOB. Lasix increased to 40 mg bid. She was seen and  noted that she could not do anything due to SOB and marked fatigue. No cough or chest pain. Marked edema. When in our office lasix increased further to 60 mg bid and potassium added.  Echo performed on 11/07/16 showed low normal LV function with moderate biatrial enlargment. No significant valvular disease. When seen in March 2018 she was doing much better with a 12 lb weight loss. Metolazone was changed to prn.   On follow up today she is doing well. Denies any increased swelling or dyspnea. Weight is stable. No chest pain or palpitations. She did have an episode of gout some time ago but no recurrence and she is no longer on medication.   The patient does not have symptoms concerning for COVID-19 infection (fever, chills, cough, or new shortness of breath).    Past Medical History:  Diagnosis Date  . Abnormality of gait 10/09/2008  . Aortic valve sclerosis   . Atrial fibrillation, persistent   . Closed fracture of shaft of femur ([place]) 11.14/2012  . Enthesopathy of unspecified site 03/19/2009  . Fatigue   . H/O total hysterectomy   . Hyperlipidemia   . Hypertension   . MVP (mitral valve prolapse)   . Myalgia and myositis, unspecified 08/06/2010  . Nonspecific abnormal results of pulmonary system function study 08/06/2010  . Nontoxic uninodular goiter 08/06/2010  . OA (osteoarthritis)   . Obesity   . Osteoporosis, senile 08/15/2004  . Other abnormal blood chemistry 06/25/2010  . Overweight and obesity(278.0) 11/11/2012  .  Pain in joint, ankle and foot 12/28/2008  . Plantar fasciitis   . Shortness of breath    WITH EXERTION  . Thyroid disease   . Unspecified asthma, with exacerbation 12/02/2005  . Unspecified essential hypertension 08/15/2004  . Unspecified hypothyroidism 08/06/2010  . Vitamin D deficiency    Past Surgical History:  Procedure Laterality Date  . ABDOMINAL HYSTERECTOMY  2001   Total Dr. Rexford Maus  . BASAL CELL CARCINOMA EXCISION  2006   Above left side of lip  .  CATARACT EXTRACTION W/ INTRAOCULAR LENS  IMPLANT, BILATERAL  08/2015   Dr. Maricela Curet  . COLONOSCOPY  09/27/2008   Dr. Sammuel Cooper normal  . [place]  . [place]  . FRACTURE SURGERY Left 2012   hip ORIF  . SKIN CANCER EXCISION  2003   NOSE  . TONSILLECTOMY       Current Meds  Medication Sig  . atorvastatin (LIPITOR) 10 MG tablet Take 1 tablet (10 mg total) by mouth daily.  . calcium carbonate (OS-CAL) 600 MG TABS tablet Take 600 mg by mouth. Take one tablet twice daily  . Cholecalciferol (VITAMIN D-3) 1000 UNITS CAPS Take 1 capsule by mouth daily.   . furosemide (LASIX) 40 MG tablet Take 1 tablet (40 mg total) by mouth 2 (two) times daily.  Marland Kitchen levothyroxine (SYNTHROID, LEVOTHROID) 50 MCG tablet Take 1 tablet (50 mcg total) by mouth daily.  . methocarbamol (ROBAXIN) 500 MG tablet Take 500 mg by mouth every 6 (six) hours as needed for muscle spasms.  . metoprolol tartrate (LOPRESSOR) 25 MG tablet Take 1 tablet (25 mg total) by mouth 2 (two) times daily.  . Multiple Vitamin (MULTIVITAMIN) tablet Take 1 tablet by mouth daily. Take 1 tablet once a day.  . potassium chloride SA (K-DUR,KLOR-CON) 20 MEQ tablet take 1 tablet by mouth twice a day  . Rivaroxaban (XARELTO) 15 MG TABS tablet Take 1 tablet (15 mg total) by mouth daily with supper.  . traMADol (ULTRAM) 50 MG tablet Take 1 tablet (50 mg total) by mouth every 6 (six) hours as needed for severe pain.     Allergies:   Patient has no known allergies.   Social History   Tobacco Use  . Smoking status: Never Smoker  . Smokeless tobacco: Never Used  Substance Use Topics  . Alcohol use: Yes    Alcohol/week: 3.0 - 4.0 standard drinks    Types: 3 - 4 Glasses of wine per week    Comment: OCCASIONAL GLASS OF WINE  . Drug use: No     Family Hx: The patient's family history includes Aneurysm in her father; Stroke in her father; Stroke (age of onset: 49) in her mother.  ROS:   Please see the history of present  illness.    All other systems reviewed and are negative.   Prior CV studies:   The following studies were reviewed today:  Echo 11/07/16: Study Conclusions  - Left ventricle: The cavity size was normal. Wall thickness was normal. Systolic function was normal. The estimated ejection fraction was in the range of 50% to 55%. Wall motion was normal; there were no regional wall motion abnormalities. - Aortic valve: There was trivial regurgitation. - Mitral valve: Systolic bowing without prolapse. - Left atrium: The atrium was moderately dilated. - Right atrium: The atrium was moderately dilated. - Atrial septum: No defect or patent foramen ovale was identified.   Labs/Other Tests and Data Reviewed:  EKG:  No ECG reviewed.  Recent Labs: No results found for requested labs within last 8760 hours.   Recent Lipid Panel Lab Results  Component Value Date/Time   CHOL 178 05/07/2016 07:00 AM   TRIG 85 05/07/2016 07:00 AM   HDL 87 (A) 05/07/2016 07:00 AM   LDLCALC 74 05/07/2016 07:00 AM   Labs dated 01/12/19: cholesterol 195, triglycerides 69, HDL 63, LDL 118. A1c 5.6%. creatinine 1.2. BUN 25. Other chemistries, CBC and TSH normal.   Wt Readings from Last 3 Encounters:  04/15/19 182 lb 4 oz (82.7 kg)  02/09/19 184 lb (83.5 kg)  12/28/18 184 lb (83.5 kg)     Objective:    Vital Signs:  Ht 5\' 1"  (1.549 m)   Wt 182 lb 4 oz (82.7 kg)   BMI 34.44 kg/m    VITAL SIGNS:  reviewed  ASSESSMENT & PLAN:    1. Atrial fibrillation chronic.  Rate reportedly  is  well controlled on metoprolol.  Anticoagulated with Xarelto with elevated CHAD-vasc score of 4. Continue current therapy  2. HTN controlled. On metoprolol and lasix.  3.  Chronic diastolic CHF.   Weight is stable and she denies any increase in edema. Otherwise  asymptomatic. On lasix once a day. Sodium restriction.   4. Hypercholesterolemia.   COVID-19 Education: The signs and symptoms of COVID-19 were discussed  with the patient and how to seek care for testing (follow up with PCP or arrange E-visit).  The importance of social distancing was discussed today.  Time:   Today, I have spent 10 minutes with the patient with telehealth technology discussing the above problems.     Medication Adjustments/Labs and Tests Ordered: Current medicines are reviewed at length with the patient today.  Concerns regarding medicines are outlined above.   Tests Ordered: No orders of the defined types were placed in this encounter.   Medication Changes: No orders of the defined types were placed in this encounter.  Follow Up:  In Person in 6 month(s)  Signed, Cameka Rae [place], MD  04/15/2019 8:19 AM    [place]

## 2019-04-15 ENCOUNTER — Encounter: Payer: Self-pay | Admitting: Cardiology

## 2019-04-15 ENCOUNTER — Telehealth (INDEPENDENT_AMBULATORY_CARE_PROVIDER_SITE_OTHER): Payer: Medicare Other | Admitting: Cardiology

## 2019-04-15 VITALS — Ht 61.0 in | Wt 182.2 lb

## 2019-04-15 DIAGNOSIS — I5032 Chronic diastolic (congestive) heart failure: Secondary | ICD-10-CM

## 2019-04-15 DIAGNOSIS — I482 Chronic atrial fibrillation, unspecified: Secondary | ICD-10-CM

## 2019-04-15 DIAGNOSIS — I10 Essential (primary) hypertension: Secondary | ICD-10-CM

## 2019-04-15 NOTE — Patient Instructions (Signed)
Medication Instructions:  Continue same medications If you need a refill on your cardiac medications before your next appointment, please call your pharmacy.   Lab work: None Ordered   Testing/Procedures: None ordered  Follow-Up: At [facility], you and your health needs are our priority.  As part of our continuing mission to provide you with exceptional heart care, we have created designated Provider Care Teams.  These Care Teams include your primary Cardiologist (physician) and Advanced Practice Providers (APPs -  Physician Assistants and Nurse Practitioners) who all work together to provide you with the care you need, when you need it. . Schedule follow up appointment in 6 months   Call in Nov to schedule Feb appointment

## 2019-06-01 DIAGNOSIS — D2372 Other benign neoplasm of skin of left lower limb, including hip: Secondary | ICD-10-CM | POA: Diagnosis not present

## 2019-06-01 DIAGNOSIS — L814 Other melanin hyperpigmentation: Secondary | ICD-10-CM | POA: Diagnosis not present

## 2019-06-01 DIAGNOSIS — L821 Other seborrheic keratosis: Secondary | ICD-10-CM | POA: Diagnosis not present

## 2019-06-01 DIAGNOSIS — Z85828 Personal history of other malignant neoplasm of skin: Secondary | ICD-10-CM | POA: Diagnosis not present

## 2019-06-01 DIAGNOSIS — D485 Neoplasm of uncertain behavior of skin: Secondary | ICD-10-CM | POA: Diagnosis not present

## 2019-06-01 DIAGNOSIS — D1801 Hemangioma of skin and subcutaneous tissue: Secondary | ICD-10-CM | POA: Diagnosis not present

## 2019-06-01 DIAGNOSIS — L570 Actinic keratosis: Secondary | ICD-10-CM | POA: Diagnosis not present

## 2019-06-03 DIAGNOSIS — Z23 Encounter for immunization: Secondary | ICD-10-CM | POA: Diagnosis not present

## 2019-06-05 ENCOUNTER — Other Ambulatory Visit: Payer: Self-pay | Admitting: Cardiology

## 2019-07-12 DIAGNOSIS — E559 Vitamin D deficiency, unspecified: Secondary | ICD-10-CM | POA: Diagnosis not present

## 2019-07-12 DIAGNOSIS — D692 Other nonthrombocytopenic purpura: Secondary | ICD-10-CM | POA: Diagnosis not present

## 2019-07-12 DIAGNOSIS — J45998 Other asthma: Secondary | ICD-10-CM | POA: Diagnosis not present

## 2019-07-12 DIAGNOSIS — I4891 Unspecified atrial fibrillation: Secondary | ICD-10-CM | POA: Diagnosis not present

## 2019-07-12 DIAGNOSIS — I130 Hypertensive heart and chronic kidney disease with heart failure and stage 1 through stage 4 chronic kidney disease, or unspecified chronic kidney disease: Secondary | ICD-10-CM | POA: Diagnosis not present

## 2019-07-12 DIAGNOSIS — R600 Localized edema: Secondary | ICD-10-CM | POA: Diagnosis not present

## 2019-07-12 DIAGNOSIS — E039 Hypothyroidism, unspecified: Secondary | ICD-10-CM | POA: Diagnosis not present

## 2019-07-12 DIAGNOSIS — E785 Hyperlipidemia, unspecified: Secondary | ICD-10-CM | POA: Diagnosis not present

## 2019-07-12 DIAGNOSIS — I509 Heart failure, unspecified: Secondary | ICD-10-CM | POA: Diagnosis not present

## 2019-07-12 DIAGNOSIS — N1831 Chronic kidney disease, stage 3a: Secondary | ICD-10-CM | POA: Diagnosis not present

## 2019-09-02 DIAGNOSIS — Z23 Encounter for immunization: Secondary | ICD-10-CM | POA: Diagnosis not present

## 2019-09-13 ENCOUNTER — Other Ambulatory Visit: Payer: Self-pay | Admitting: Cardiology

## 2019-09-13 MED ORDER — METOPROLOL TARTRATE 25 MG PO TABS: 25.0000 mg | ORAL_TABLET | Freq: Two times a day (BID) | ORAL | 1 refills | Status: DC

## 2019-09-13 NOTE — Telephone Encounter (Signed)
*  STAT* If patient is at the pharmacy, call can be transferred to refill team.   1. Which medications need to be refilled? (please list name of each medication and dose if known) metoprolol tartrate (LOPRESSOR) 25 MG tablet  2. Which pharmacy/location (including street and city if local pharmacy) is medication to be sent to? [place], [place] - [id number]  3. Do they need a 30 day or 90 day supply? [place]

## 2019-09-14 ENCOUNTER — Other Ambulatory Visit: Payer: Self-pay | Admitting: Cardiology

## 2019-09-15 ENCOUNTER — Other Ambulatory Visit: Payer: Self-pay | Admitting: Cardiology

## 2019-09-15 MED ORDER — METOPROLOL TARTRATE 25 MG PO TABS: 25.0000 mg | ORAL_TABLET | Freq: Two times a day (BID) | ORAL | 2 refills | Status: DC

## 2019-09-15 NOTE — Telephone Encounter (Signed)
*  STAT* If patient is at the pharmacy, call can be transferred to refill team.   1. Which medications need to be refilled? (please list name of each medication and dose if known) metoprolol tartrate (LOPRESSOR) 25 MG tablet  2. Which pharmacy/location (including street and city if local pharmacy) is medication to be sent to? [place], [place] - [id number]  3. Do they need a 30 day or 90 day supply? 90  Patient has OV with Dr. Martinique on 10/14/19 and is currently out of medication.

## 2019-09-29 DIAGNOSIS — Z23 Encounter for immunization: Secondary | ICD-10-CM | POA: Diagnosis not present

## 2019-10-10 NOTE — Progress Notes (Signed)
Cheyenne Cruz Date of Birth: 04-18-1929 Medical Record [id number]  History of Present Illness: Cheyenne Cruz is seen for follow up of atrial fibrillation and CHF. She was seen in 2011 with symptoms of dyspnea. She had an Echo at that time that showed Normal LV function, AV sclerosis, and mild MR. A stress test showed very poor exercise tolerance. She had a cardiac cath that showed no CAD and normal right heart pressures.She had pulmonary evaluation at that time with Dr. Gwenette Greet with normal PFTs.  In 2015 she was found to be in atrial fibrillation.  She was asymptomatic.   Started on metoprolol and Xarelto. She later developed findings of diastolic CHF. She was placed on lasix with improvement.    She was seen by Dr Mariea Clonts on 10/18/16 and noted to have weight gain, increased edema and marked increase in SOB. Lasix increased to 40 mg bid. She was seen and noted that she could not do anything due to SOB and marked fatigue. No cough or chest pain. Marked edema. When in our office lasix increased further to 60 mg bid and potassium added.  Echo performed on 11/07/16 showed low normal LV function with moderate biatrial enlargment. No significant valvular disease. When seen in March 2018 she was doing much better with a 12 lb weight loss. Metolazone was changed to prn. Home oxygen was discontinued.   On follow up today she is doing OK. She still notes dyspnea if she tries to do anything more strenuous. Is now only taking lasix once a day and swelling hasn't changed. Her weight is up but she attributes this to eating ice cream daily. Overall feels well.    Allergies as of 10/14/2019   No Known Allergies     Medication List       Accurate as of October 14, 2019  9:53 AM. If you have any questions, ask your nurse or doctor.        STOP taking these medications   allopurinol 100 MG tablet Commonly known as: ZYLOPRIM Stopped by: Cheyenne Guillermo Martinique, MD   Colchicine 0.6 MG Caps Stopped by: Cheyenne Rossetti Martinique, MD     methocarbamol 500 MG tablet Commonly known as: ROBAXIN Stopped by: Cheyenne Vandekamp Martinique, MD   potassium chloride SA 20 MEQ tablet Commonly known as: KLOR-CON Stopped by: Cheyenne Shieh Martinique, MD   traMADol 50 MG tablet Commonly known as: ULTRAM Stopped by: Cheyenne Ranes Martinique, MD     TAKE these medications   atorvastatin 10 MG tablet Commonly known as: LIPITOR Take 1 tablet (10 mg total) by mouth daily.   calcium carbonate 600 MG Tabs tablet Commonly known as: OS-CAL Take 600 mg by mouth. Take one tablet twice daily   furosemide 40 MG tablet Commonly known as: LASIX Take 1 tablet (40 mg total) by mouth 2 (two) times daily. What changed: when to take this   levothyroxine 50 MCG tablet Commonly known as: SYNTHROID Take 1 tablet (50 mcg total) by mouth daily.   metoprolol tartrate 25 MG tablet Commonly known as: LOPRESSOR Take 1 tablet (25 mg total) by mouth 2 (two) times daily. *PATIENT NEEDS OV FOR FURTHER REFILLS**   multivitamin tablet Take 1 tablet by mouth daily. Take 1 tablet once a day.   Rivaroxaban 15 MG Tabs tablet Commonly known as: XARELTO Take 1 tablet (15 mg total) by mouth daily with supper.   Vitamin D-3 25 MCG (1000 UT) Caps Take 1 capsule by mouth daily.       No Known Allergies  Past Medical History:  Diagnosis Date  . Abnormality of gait 10/09/2008  . Aortic valve sclerosis   . Atrial fibrillation, persistent ([place])   . Closed fracture of shaft of femur ([place]) 11.14/2012  . Enthesopathy of unspecified site 03/19/2009  . Fatigue   . H/O total hysterectomy   . Hyperlipidemia   . Hypertension   . MVP (mitral valve prolapse)   . Myalgia and myositis, unspecified 08/06/2010  . Nonspecific abnormal results of pulmonary system function study 08/06/2010  . Nontoxic uninodular goiter 08/06/2010  . OA (osteoarthritis)   . Obesity   . Osteoporosis, senile 08/15/2004  . Other abnormal blood chemistry 06/25/2010  . Overweight and obesity(278.0) 11/11/2012  . Pain in  joint, ankle and foot 12/28/2008  . Plantar fasciitis   . Shortness of breath    WITH EXERTION  . Thyroid disease   . Unspecified asthma, with exacerbation 12/02/2005  . Unspecified essential hypertension 08/15/2004  . Unspecified hypothyroidism 08/06/2010  . Vitamin D deficiency     Past Surgical History:  Procedure Laterality Date  . ABDOMINAL HYSTERECTOMY  2001   Total Dr. Rexford Maus  . BASAL CELL CARCINOMA EXCISION  2006   Above left side of lip  . CATARACT EXTRACTION W/ INTRAOCULAR LENS  IMPLANT, BILATERAL  08/2015   Dr. Maricela Curet  . COLONOSCOPY  09/27/2008   Dr. Sammuel Cooper normal  . [place]  . [place]  . FRACTURE SURGERY Left 2012   hip ORIF  . SKIN CANCER EXCISION  2003   NOSE  . TONSILLECTOMY      Social History   Socioeconomic History  . Marital status: Married    Spouse name: Not on file  . Number of children: Not on file  . Years of education: Not on file  . Highest education level: Not on file  Occupational History  . 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 Smoking status: Never Smoker  . Smokeless tobacco: Never Used  Substance and Sexual Activity  . Alcohol use: Yes    Alcohol/week: 3.0 - 4.0 standard drinks    Types: 3 - 4 Glasses of wine per week    Comment: OCCASIONAL GLASS OF WINE  . Drug use: No  . Sexual activity: Not Currently  Other Topics Concern  . Not on file  Social History Narrative   Married 1954, [name].    Retired Pharmacist, hospital.    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 Spends summers in [place].    No smoking history    drinks minimal amount of alcohol.   Exercise predominantly weight training, walking dog   DNR   Social Determinants of Health   Financial Resource Strain:   . Difficulty of Paying Living Expenses: Not on file  Food Insecurity:   . Worried About Charity fundraiser in the Last Year: Not on file  .  Ran Out of Food in the Last Year: Not on file  Transportation Needs:   . Lack of Transportation (Medical): Not on file  . Lack of Transportation (Non-Medical): Not on file  Physical Activity:   . Days of Exercise per Week: Not on file  . Minutes of Exercise per Session: Not on file  Stress:   . Feeling of Stress : Not on file  Social Connections:   . Frequency of Communication with Friends and Family: Not on file  . Frequency of Social  Gatherings with Friends and Family: Not on file  . Attends Religious Services: Not on file  . Active Member of Clubs or Organizations: Not on file  . Attends Archivist Meetings: Not on file  . Marital Status: Not on file    Family History  Problem Relation Age of Onset  . Stroke Mother 70       FOLLOWING A BROKEN HIP  . Stroke Father   . Aneurysm Father        Abdominal aortic, repaired    Review of Systems: As noted in HPI.  All other systems were reviewed and are negative.  Physical Exam: BP 126/70 (BP Location: Right Arm, Patient Position: Sitting, Cuff Size: Large)   Pulse (!) 53   Temp (!) 97.3 F (36.3 C)   Ht 5' 2.5" (1.588 m)   Wt 192 lb (87.1 kg)   BMI 34.56 kg/m  Filed Weights   10/14/19 0923  Weight: 192 lb (87.1 kg)    GENERAL:  Well appearing, overweight WF in NAD HEENT:  PERRL, EOMI, sclera are clear. Oropharynx is clear. NECK:  No jugular venous distention, carotid upstroke brisk and symmetric, no bruits, no thyromegaly or adenopathy LUNGS:  Clear to auscultation bilaterally CHEST:  Unremarkable HEART:  IRRR,  PMI not displaced or sustained,S1 and S2 within normal limits, no S3, no S4: no clicks, no rubs, no murmurs ABD:  Soft, nontender. BS +, no masses or bruits. No hepatomegaly, no splenomegaly EXT:  2 + pulses throughout, no edema, no cyanosis no clubbing SKIN:  Warm and dry.  No rashes NEURO:  Alert and oriented x 3. Cranial nerves II through XII intact. PSYCH:  Cognitively intact     LABORATORY  DATA: Lab Results  Component Value Date   WBC 6.6 09/13/2016   HGB 12.2 09/13/2016   HCT 36.0 09/13/2016   PLT 290 09/13/2016   GLUCOSE 142 (H) 11/04/2016   CHOL 178 05/07/2016   TRIG 85 05/07/2016   HDL 87 (A) 05/07/2016   LDLCALC 74 05/07/2016   ALT 15 06/13/2015   AST 19 06/13/2015   NA 140 11/04/2016   K 4.3 11/04/2016   CL 98 11/04/2016   CREATININE 0.87 11/04/2016   BUN 18 11/04/2016   CO2 32 (H) 11/04/2016   TSH 3.63 05/07/2016   HGBA1C 6.0 05/07/2016   Labs dated 01/01/17: cholesterol 181, triglycerides 83, HDL 55, LDL 109. CMET, TSH, and CBC normal.  Dated 01/05/18: cholesterol 186, triglycerides 71, HDL 61, LDL 111. Creatinine 1.1. other chemistries and CBC normal Dated 01/12/19: cholesterol 195, triglycerides 69, HDL 63, LDL 118. A1c 5,6%, creatinine 1.1. otherwise CBC, CMET and TSH normal.   Ecg today shows Afib with rate 53. Old septal infarct. I have personally reviewed and interpreted this study.   Echo 11/07/16: Study Conclusions  - Left ventricle: The cavity size was normal. Wall thickness was   normal. Systolic function was normal. The estimated ejection   fraction was in the range of 50% to 55%. Wall motion was normal;   there were no regional wall motion abnormalities. - Aortic valve: There was trivial regurgitation. - Mitral valve: Systolic bowing without prolapse. - Left atrium: The atrium was moderately dilated. - Right atrium: The atrium was moderately dilated. - Atrial septum: No defect or patent foramen ovale was identified.  Assessment / Plan: 1. Atrial fibrillation chronic.  Rate is  well controlled on metoprolol.  Anticoagulated with Xarelto with elevated CHAD-vasc score of 4. Continue current therapy  2. HTN controlled. On metoprolol and lasix.  3.  Chronic diastolic CHF. She appears euvolemic today.  Weight is up but lungs are clear and she has no significant edema on exam.  She has chronic class 2 symptoms.  She has reduced her lasix use.   Sodium restriction.   4. Hypercholesterolemia.     I will follow up in 6 months

## 2019-10-14 ENCOUNTER — Encounter: Payer: Self-pay | Admitting: Cardiology

## 2019-10-14 ENCOUNTER — Ambulatory Visit (INDEPENDENT_AMBULATORY_CARE_PROVIDER_SITE_OTHER): Payer: Medicare Other | Admitting: Cardiology

## 2019-10-14 ENCOUNTER — Other Ambulatory Visit: Payer: Self-pay

## 2019-10-14 VITALS — BP 126/70 | HR 53 | Temp 97.3°F | Ht 62.5 in | Wt 192.0 lb

## 2019-10-14 DIAGNOSIS — I10 Essential (primary) hypertension: Secondary | ICD-10-CM

## 2019-10-14 DIAGNOSIS — I5032 Chronic diastolic (congestive) heart failure: Secondary | ICD-10-CM | POA: Diagnosis not present

## 2019-10-14 DIAGNOSIS — I482 Chronic atrial fibrillation, unspecified: Secondary | ICD-10-CM

## 2019-10-21 ENCOUNTER — Other Ambulatory Visit: Payer: Self-pay | Admitting: Cardiology

## 2019-10-21 NOTE — Telephone Encounter (Signed)
New Message   *STAT* If patient is at the pharmacy, call can be transferred to refill team.   1. Which medications need to be refilled? (please list name of each medication and dose if known)  Rivaroxaban (XARELTO) 15 MG TABS tablet  2. Which pharmacy/location (including street and city if local pharmacy) is medication to be sent to? [place], [place] - [id number]  3. Do they need a 30 day or 90 day supply?  90 day supply

## 2019-10-22 MED ORDER — RIVAROXABAN 15 MG PO TABS: 15.0000 mg | ORAL_TABLET | Freq: Every day | ORAL | 1 refills | Status: DC

## 2019-10-22 NOTE — Telephone Encounter (Signed)
89 F 87.1 kg, SCr 1.1 (12/2018), LOV 2/21 [place]

## 2020-01-25 DIAGNOSIS — E559 Vitamin D deficiency, unspecified: Secondary | ICD-10-CM | POA: Diagnosis not present

## 2020-01-25 DIAGNOSIS — E038 Other specified hypothyroidism: Secondary | ICD-10-CM | POA: Diagnosis not present

## 2020-01-25 DIAGNOSIS — E7849 Other hyperlipidemia: Secondary | ICD-10-CM | POA: Diagnosis not present

## 2020-01-28 ENCOUNTER — Other Ambulatory Visit: Payer: Self-pay | Admitting: Cardiology

## 2020-01-28 MED ORDER — FUROSEMIDE 40 MG PO TABS: 40.0000 mg | ORAL_TABLET | Freq: Every day | ORAL | 1 refills | Status: DC

## 2020-01-28 NOTE — Telephone Encounter (Signed)
New Message    *STAT* If patient is at the pharmacy, call can be transferred to refill team.   1. Which medications need to be refilled? (please list name of each medication and dose if known) furosemide (LASIX) 40 MG tablet  2. Which pharmacy/location (including street and city if local pharmacy) is medication to be sent to? [place], [place] - [address]  3. Do they need a 30 day or 90 day supply? [place]

## 2020-02-01 DIAGNOSIS — R82998 Other abnormal findings in urine: Secondary | ICD-10-CM | POA: Diagnosis not present

## 2020-03-22 ENCOUNTER — Telehealth: Payer: Self-pay | Admitting: Cardiology

## 2020-03-22 ENCOUNTER — Other Ambulatory Visit: Payer: Self-pay | Admitting: Cardiology

## 2020-03-22 NOTE — Telephone Encounter (Signed)
    Pt would like to give new preferred pharmacy she would like to use from now on.  [facility] [address], [place], [place] [address] [phone]

## 2020-03-22 NOTE — Telephone Encounter (Signed)
[facility] entered in chart. 

## 2020-03-22 NOTE — Telephone Encounter (Signed)
Prescription refill request for Xarelto received.  Indication:  Atrial Fibrillation Last office visit: 10/14/2019 Cheyenne Cruz Weight:  87.1 kg Age: 86 Scr: 1.1  01/25/2020 CrCl: 48.61 ml/min  Prescription refilled

## 2020-03-28 DIAGNOSIS — H5211 Myopia, right eye: Secondary | ICD-10-CM | POA: Diagnosis not present

## 2020-03-28 DIAGNOSIS — Z961 Presence of intraocular lens: Secondary | ICD-10-CM | POA: Diagnosis not present

## 2020-04-09 NOTE — Progress Notes (Signed)
Cheyenne Cruz Date of Birth: 11/07/1928 Medical Record #[id number]  History of Present Illness: Cheyenne Cruz is seen for follow up of atrial fibrillation and CHF. She was seen in 2011 with symptoms of dyspnea. She had an Echo at that time that showed Normal LV function, AV sclerosis, and mild MR. A stress test showed very poor exercise tolerance. She had a cardiac cath that showed no CAD and normal right heart pressures.She had pulmonary evaluation at that time with Dr. Gwenette Greet with normal PFTs.  In 2015 she was found to be in atrial fibrillation.  She was asymptomatic.   Started on metoprolol and Xarelto. She later developed findings of diastolic CHF. She was placed on lasix with improvement.    She was seen by Dr Mariea Clonts on 10/18/16 and noted to have weight gain, increased edema and marked increase in SOB. Lasix increased to 40 mg bid. She was seen and noted that she could not do anything due to SOB and marked fatigue. No cough or chest pain. Marked edema. When in our office lasix increased further to 60 mg bid and potassium added.  Echo performed on 11/07/16 showed low normal LV function with moderate biatrial enlargment. No significant valvular disease. When seen in March 2018 she was doing much better with a 12 lb weight loss. Metolazone was changed to prn. Home oxygen was discontinued.   On follow up today she is doing OK. She still notes dyspnea if she walks up hills. States her  swelling hasn't changed. Notes she dropped out of the exercise classes at [facility].    Allergies as of 04/13/2020   No Known Allergies     Medication List       Accurate as of April 13, 2020 10:36 AM. If you have any questions, ask your nurse or doctor.        atorvastatin 10 MG tablet Commonly known as: LIPITOR Take 1 tablet (10 mg total) by mouth daily.   calcium carbonate 600 MG Tabs tablet Commonly known as: OS-CAL Take 600 mg by mouth. Take one tablet twice daily   furosemide 40 MG tablet Commonly  known as: LASIX Take 1 tablet (40 mg total) by mouth daily.   levothyroxine 50 MCG tablet Commonly known as: SYNTHROID Take 1 tablet (50 mcg total) by mouth daily.   metoprolol tartrate 25 MG tablet Commonly known as: LOPRESSOR Take 1 tablet (25 mg total) by mouth 2 (two) times daily. *PATIENT NEEDS OV FOR FURTHER REFILLS**   multivitamin tablet Take 1 tablet by mouth daily. Take 1 tablet once a day.   Vitamin D-3 25 MCG (1000 UT) Caps Take 1 capsule by mouth daily.   Xarelto 15 MG Tabs tablet Generic drug: Rivaroxaban TAKE ONE TABLET EACH DAY WITH SUPPER       No Known Allergies  Past Medical History:  Diagnosis Date  . Abnormality of gait 10/09/2008  . Aortic valve sclerosis   . Atrial fibrillation, persistent ([place])   . Closed fracture of shaft of femur ([place]) 11.14/2012  . Enthesopathy of unspecified site 03/19/2009  . Fatigue   . H/O total hysterectomy   . Hyperlipidemia   . Hypertension   . MVP (mitral valve prolapse)   . Myalgia and myositis, unspecified 08/06/2010  . Nonspecific abnormal results of pulmonary system function study 08/06/2010  . Nontoxic uninodular goiter 08/06/2010  . OA (osteoarthritis)   . Obesity   . Osteoporosis, senile 08/15/2004  . Other abnormal blood chemistry 06/25/2010  . Overweight and  obesity(278.0) 11/11/2012  . Pain in joint, ankle and foot 12/28/2008  . Plantar fasciitis   . Shortness of breath    WITH EXERTION  . Thyroid disease   . Unspecified asthma, with exacerbation 12/02/2005  . Unspecified essential hypertension 08/15/2004  . Unspecified hypothyroidism 08/06/2010  . Vitamin D deficiency     Past Surgical History:  Procedure Laterality Date  . ABDOMINAL HYSTERECTOMY  2001   Total Dr. Rexford Maus  . BASAL CELL CARCINOMA EXCISION  2006   Above left side of lip  . CATARACT EXTRACTION W/ INTRAOCULAR LENS  IMPLANT, BILATERAL  08/2015   Dr. Maricela Curet  . COLONOSCOPY  09/27/2008   Dr. Sammuel Cooper normal  . [place]   . [place]  . FRACTURE SURGERY Left 2012   hip ORIF  . SKIN CANCER EXCISION  2003   NOSE  . TONSILLECTOMY      Social History   Socioeconomic History  . Marital status: Married    Spouse name: Not on file  . Number of children: Not on file  . Years of education: Not on file  . Highest education level: Not on file  Occupational History  . 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 Smoking status: Never Smoker  . Smokeless tobacco: Never Used  Vaping Use  . Vaping Use: Never used  Substance and Sexual Activity  . Alcohol use: Yes    Alcohol/week: 3.0 - 4.0 standard drinks    Types: 3 - 4 Glasses of wine per week    Comment: OCCASIONAL GLASS OF WINE  . Drug use: No  . Sexual activity: Not Currently  Other Topics Concern  . Not on file  Social History Narrative   Married 1954, [name].    Retired Pharmacist, hospital.    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 Spends summers in [place].    No smoking history    drinks minimal amount of alcohol.   Exercise predominantly weight training, walking dog   DNR   Social Determinants of Health   Financial Resource Strain:   . Difficulty of Paying Living Expenses: Not on file  Food Insecurity:   . Worried About Charity fundraiser in the Last Year: Not on file  . Ran Out of Food in the Last Year: Not on file  Transportation Needs:   . Lack of Transportation (Medical): Not on file  . Lack of Transportation (Non-Medical): Not on file  Physical Activity:   . Days of Exercise per Week: Not on file  . Minutes of Exercise per Session: Not on file  Stress:   . Feeling of Stress : Not on file  Social Connections:   . Frequency of Communication with Friends and Family: Not on file  . Frequency of Social Gatherings with Friends and Family: Not on file  . Attends Religious Services: Not on file  . Active Member of Clubs or Organizations:  Not on file  . Attends Archivist Meetings: Not on file  . Marital Status: Not on file    Family History  Problem Relation Age of Onset  . Stroke Mother 49       FOLLOWING A BROKEN HIP  . Stroke Father   . Aneurysm Father        Abdominal aortic, repaired    Review of Systems: As noted in HPI.  All other systems were reviewed and are  negative.  Physical Exam: BP 121/70   Pulse 69   Ht 5\' 2"  (1.575 m)   Wt 188 lb 12.8 oz (85.6 kg)   SpO2 97%   BMI 34.53 kg/m  Filed Weights   04/13/20 1012  Weight: 188 lb 12.8 oz (85.6 kg)    GENERAL:  Well appearing, overweight WF in NAD HEENT:  PERRL, EOMI, sclera are clear. Oropharynx is clear. NECK:  No jugular venous distention, carotid upstroke brisk and symmetric, no bruits, no thyromegaly or adenopathy LUNGS:  Clear to auscultation bilaterally CHEST:  Unremarkable HEART:  IRRR,  PMI not displaced or sustained,S1 and S2 within normal limits, no S3, no S4: no clicks, no rubs, no murmurs ABD:  Soft, nontender. BS +, no masses or bruits. No hepatomegaly, no splenomegaly EXT:  2 + pulses throughout, no edema, no cyanosis no clubbing SKIN:  Warm and dry.  No rashes NEURO:  Alert and oriented x 3. Cranial nerves II through XII intact. PSYCH:  Cognitively intact     LABORATORY DATA: Lab Results  Component Value Date   WBC 6.6 09/13/2016   HGB 12.2 09/13/2016   HCT 36.0 09/13/2016   PLT 290 09/13/2016   GLUCOSE 142 (H) 11/04/2016   CHOL 178 05/07/2016   TRIG 85 05/07/2016   HDL 87 (A) 05/07/2016   LDLCALC 74 05/07/2016   ALT 15 06/13/2015   AST 19 06/13/2015   NA 140 11/04/2016   K 4.3 11/04/2016   CL 98 11/04/2016   CREATININE 0.87 11/04/2016   BUN 18 11/04/2016   CO2 32 (H) 11/04/2016   TSH 3.63 05/07/2016   HGBA1C 6.0 05/07/2016   Labs dated 01/01/17: cholesterol 181, triglycerides 83, HDL 55, LDL 109. CMET, TSH, and CBC normal.  Dated 01/05/18: cholesterol 186, triglycerides 71, HDL 61, LDL 111. Creatinine  1.1. other chemistries and CBC normal Dated 01/12/19: cholesterol 195, triglycerides 69, HDL 63, LDL 118. A1c 5,6%, creatinine 1.1. otherwise CBC, CMET and TSH normal.  Dated 01/25/20: A1c 5.5%. cholesterol 184, triglycerides 104, HDL 67, LDL 96. Creatinine 1.1. otherwise CBC, CMET and TFTs normal.   Echo 11/07/16: Study Conclusions  - Left ventricle: The cavity size was normal. Wall thickness was   normal. Systolic function was normal. The estimated ejection   fraction was in the range of 50% to 55%. Wall motion was normal;   there were no regional wall motion abnormalities. - Aortic valve: There was trivial regurgitation. - Mitral valve: Systolic bowing without prolapse. - Left atrium: The atrium was moderately dilated. - Right atrium: The atrium was moderately dilated. - Atrial septum: No defect or patent foramen ovale was identified.  Assessment / Plan: 1. Atrial fibrillation chronic.  Rate is  well controlled on metoprolol.  Anticoagulated with Xarelto with elevated CHAD-vasc score of 4. Continue current therapy  2. HTN controlled. On metoprolol and lasix.  3.  Chronic diastolic CHF. She appears euvolemic today.  Weight is down 4 lbs.   She has chronic class 2 symptoms.  Continue current lasix dose.  Sodium restriction.   4. Hypercholesterolemia.     I will follow up in 6 months

## 2020-04-13 ENCOUNTER — Encounter: Payer: Self-pay | Admitting: Cardiology

## 2020-04-13 ENCOUNTER — Ambulatory Visit (INDEPENDENT_AMBULATORY_CARE_PROVIDER_SITE_OTHER): Payer: Medicare Other | Admitting: Cardiology

## 2020-04-13 ENCOUNTER — Other Ambulatory Visit: Payer: Self-pay

## 2020-04-13 VITALS — BP 121/70 | HR 69 | Ht 62.0 in | Wt 188.8 lb

## 2020-04-13 DIAGNOSIS — I482 Chronic atrial fibrillation, unspecified: Secondary | ICD-10-CM

## 2020-04-13 DIAGNOSIS — I10 Essential (primary) hypertension: Secondary | ICD-10-CM

## 2020-04-13 DIAGNOSIS — I5032 Chronic diastolic (congestive) heart failure: Secondary | ICD-10-CM | POA: Diagnosis not present

## 2020-06-01 DIAGNOSIS — D1801 Hemangioma of skin and subcutaneous tissue: Secondary | ICD-10-CM | POA: Diagnosis not present

## 2020-06-01 DIAGNOSIS — L720 Epidermal cyst: Secondary | ICD-10-CM | POA: Diagnosis not present

## 2020-06-01 DIAGNOSIS — L570 Actinic keratosis: Secondary | ICD-10-CM | POA: Diagnosis not present

## 2020-06-01 DIAGNOSIS — Z85828 Personal history of other malignant neoplasm of skin: Secondary | ICD-10-CM | POA: Diagnosis not present

## 2020-06-01 DIAGNOSIS — L821 Other seborrheic keratosis: Secondary | ICD-10-CM | POA: Diagnosis not present

## 2020-07-04 DIAGNOSIS — I10 Essential (primary) hypertension: Secondary | ICD-10-CM | POA: Diagnosis not present

## 2020-07-04 DIAGNOSIS — M545 Low back pain, unspecified: Secondary | ICD-10-CM | POA: Diagnosis not present

## 2020-08-15 ENCOUNTER — Other Ambulatory Visit: Payer: Self-pay | Admitting: Cardiology

## 2020-09-15 NOTE — Progress Notes (Signed)
Cheyenne Cruz Date of Birth: [birth year] Medical Record #[id number]  History of Present Illness: Cheyenne Cruz is seen for follow up of atrial fibrillation and CHF. She was seen in 2011 with symptoms of dyspnea. She had an Echo at that time that showed Normal LV function, AV sclerosis, and mild MR. A stress test showed very poor exercise tolerance. She had a cardiac cath that showed no CAD and normal right heart pressures.She had pulmonary evaluation at that time with Dr. Gwenette Greet with normal PFTs.  In 2015 she was found to be in atrial fibrillation.  She was asymptomatic.   Started on metoprolol and Xarelto. She later developed findings of diastolic CHF. She was placed on lasix with improvement.    She was seen by Dr Mariea Clonts on 10/18/16 and noted to have weight gain, increased edema and marked increase in SOB. Lasix increased to 40 mg bid. She was seen and noted that she could not do anything due to SOB and marked fatigue. No cough or chest pain. Marked edema. When in our office lasix increased further to 60 mg bid and potassium added.  Echo performed on 11/07/16 showed low normal LV function with moderate biatrial enlargment. No significant valvular disease. When seen in March 2018 she was doing much better with a 12 lb weight loss. Metolazone was changed to prn. Home oxygen was discontinued.   On follow up today she is doing OK. She still notes dyspnea if she walks up hills or more than a block. States her  swelling is a little better. Has to walk with a cane now. Feels stressed and isn't sleeping well. Isn't really able to participate in exercise classes anymore.    Allergies as of 09/19/2020   No Known Allergies     Medication List       Accurate as of September 19, 2020 11:21 AM. If you have any questions, ask your nurse or doctor.        atorvastatin 10 MG tablet Commonly known as: LIPITOR Take 1 tablet (10 mg total) by mouth daily.   calcium carbonate 600 MG Tabs tablet Commonly known as:  OS-CAL Take 600 mg by mouth. Take one tablet twice daily   furosemide 40 MG tablet Commonly known as: LASIX TAKE ONE TABLET DAILY   levothyroxine 50 MCG tablet Commonly known as: SYNTHROID Take 1 tablet (50 mcg total) by mouth daily.   metoprolol tartrate 25 MG tablet Commonly known as: LOPRESSOR Take 1 tablet (25 mg total) by mouth 2 (two) times daily. *PATIENT NEEDS OV FOR FURTHER REFILLS**   multivitamin tablet Take 1 tablet by mouth daily. Take 1 tablet once a day.   Vitamin D-3 25 MCG (1000 UT) Caps Take 1 capsule by mouth daily.   Xarelto 15 MG Tabs tablet Generic drug: Rivaroxaban TAKE ONE TABLET EACH DAY WITH SUPPER       No Known Allergies  Past Medical History:  Diagnosis Date  . Abnormality of gait 10/09/2008  . Aortic valve sclerosis   . Atrial fibrillation, persistent ([place])   . Closed fracture of shaft of femur ([place]) 11.14/2012  . Enthesopathy of unspecified site 03/19/2009  . Fatigue   . H/O total hysterectomy   . Hyperlipidemia   . Hypertension   . MVP (mitral valve prolapse)   . Myalgia and myositis, unspecified 08/06/2010  . Nonspecific abnormal results of pulmonary system function study 08/06/2010  . Nontoxic uninodular goiter 08/06/2010  . OA (osteoarthritis)   . Obesity   .  Osteoporosis, senile 08/15/2004  . Other abnormal blood chemistry 06/25/2010  . Overweight and obesity(278.0) 11/11/2012  . Pain in joint, ankle and foot 12/28/2008  . Plantar fasciitis   . Shortness of breath    WITH EXERTION  . Thyroid disease   . Unspecified asthma, with exacerbation 12/02/2005  . Unspecified essential hypertension 08/15/2004  . Unspecified hypothyroidism 08/06/2010  . Vitamin D deficiency     Past Surgical History:  Procedure Laterality Date  . ABDOMINAL HYSTERECTOMY  2001   Total Dr. Rexford Maus  . BASAL CELL CARCINOMA EXCISION  2006   Above left side of lip  . CATARACT EXTRACTION W/ INTRAOCULAR LENS  IMPLANT, BILATERAL  08/2015   Dr. Maricela Curet   . COLONOSCOPY  09/27/2008   Dr. Sammuel Cooper normal  . [place]  . [place]  . FRACTURE SURGERY Left 2012   hip ORIF  . SKIN CANCER EXCISION  2003   NOSE  . TONSILLECTOMY      Social History   Socioeconomic History  . Marital status: Married    Spouse name: Not on file  . Number of children: Not on file  . Years of education: Not on file  . Highest education level: Not on file  Occupational History  . 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 Smoking status: Never Smoker  . Smokeless tobacco: Never Used  Vaping Use  . Vaping Use: Never used  Substance and Sexual Activity  . Alcohol use: Yes    Alcohol/week: 3.0 - 4.0 standard drinks    Types: 3 - 4 Glasses of wine per week    Comment: OCCASIONAL GLASS OF WINE  . Drug use: No  . Sexual activity: Not Currently  Other Topics Concern  . Not on file  Social History Narrative   Married 1954, [name].    Retired Pharmacist, hospital.    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 Spends summers in [place].    No smoking history    drinks minimal amount of alcohol.   Exercise predominantly weight training, walking dog   DNR   Social Determinants of Health   Financial Resource Strain: Not on file  Food Insecurity: Not on file  Transportation Needs: Not on file  Physical Activity: Not on file  Stress: Not on file  Social Connections: Not on file    Family History  Problem Relation Age of Onset  . Stroke Mother 11       FOLLOWING A BROKEN HIP  . Stroke Father   . Aneurysm Father        Abdominal aortic, repaired    Review of Systems: As noted in HPI.  All other systems were reviewed and are negative.  Physical Exam: BP 128/71   Pulse 65   Ht 5\' 2"  (1.575 m)   Wt 189 lb 9.6 oz (86 kg)   SpO2 99%   BMI 34.68 kg/m  Filed Weights   09/19/20 1041  Weight: 189 lb 9.6 oz (86 kg)    GENERAL:  Well appearing,  overweight WF in NAD HEENT:  PERRL, EOMI, sclera are clear. Oropharynx is clear. NECK:  No jugular venous distention, carotid upstroke brisk and symmetric, no bruits, no thyromegaly or adenopathy LUNGS:  Clear to auscultation bilaterally CHEST:  Unremarkable HEART:  IRRR,  PMI not displaced or sustained,S1 and S2 within normal limits, no S3, no S4: no clicks, no rubs, no  murmurs ABD:  Soft, nontender. BS +, no masses or bruits. No hepatomegaly, no splenomegaly EXT:  2 + pulses throughout, no edema, no cyanosis no clubbing SKIN:  Warm and dry.  No rashes NEURO:  Alert and oriented x 3. Cranial nerves II through XII intact. PSYCH:  Cognitively intact     LABORATORY DATA: Lab Results  Component Value Date   WBC 6.6 09/13/2016   HGB 12.2 09/13/2016   HCT 36.0 09/13/2016   PLT 290 09/13/2016   GLUCOSE 142 (H) 11/04/2016   CHOL 178 05/07/2016   TRIG 85 05/07/2016   HDL 87 (A) 05/07/2016   LDLCALC 74 05/07/2016   ALT 15 06/13/2015   AST 19 06/13/2015   NA 140 11/04/2016   K 4.3 11/04/2016   CL 98 11/04/2016   CREATININE 0.87 11/04/2016   BUN 18 11/04/2016   CO2 32 (H) 11/04/2016   TSH 3.63 05/07/2016   HGBA1C 6.0 05/07/2016   Labs dated 01/01/17: cholesterol 181, triglycerides 83, HDL 55, LDL 109. CMET, TSH, and CBC normal.  Dated 01/05/18: cholesterol 186, triglycerides 71, HDL 61, LDL 111. Creatinine 1.1. other chemistries and CBC normal Dated 01/12/19: cholesterol 195, triglycerides 69, HDL 63, LDL 118. A1c 5,6%, creatinine 1.1. otherwise CBC, CMET and TSH normal.  Dated 01/25/20: A1c 5.5%. cholesterol 184, triglycerides 104, HDL 67, LDL 96. Creatinine 1.1. otherwise CBC, CMET and TFTs normal.   Ecg today shows Afib rate 65. Old septal infarct. I have personally reviewed and interpreted this study.    Echo 11/07/16: Study Conclusions  - Left ventricle: The cavity size was normal. Wall thickness was   normal. Systolic function was normal. The estimated ejection   fraction  was in the range of 50% to 55%. Wall motion was normal;   there were no regional wall motion abnormalities. - Aortic valve: There was trivial regurgitation. - Mitral valve: Systolic bowing without prolapse. - Left atrium: The atrium was moderately dilated. - Right atrium: The atrium was moderately dilated. - Atrial septum: No defect or patent foramen ovale was identified.  Assessment / Plan: 1. Atrial fibrillation chronic.  Rate is  well controlled on metoprolol.  Anticoagulated with Xarelto with elevated CHAD-vasc score of 4. Continue current therapy  2. HTN controlled. On metoprolol and lasix.  3.  Chronic diastolic CHF. She appears euvolemic today.  Weight is stable and no significant edema.   She has chronic class 2 symptoms.  Continue current lasix dose.  Sodium restriction.   4. Hypercholesterolemia.   I will follow up in 6 months

## 2020-09-19 ENCOUNTER — Encounter: Payer: Self-pay | Admitting: Cardiology

## 2020-09-19 ENCOUNTER — Ambulatory Visit (INDEPENDENT_AMBULATORY_CARE_PROVIDER_SITE_OTHER): Payer: Medicare Other | Admitting: Cardiology

## 2020-09-19 ENCOUNTER — Other Ambulatory Visit: Payer: Self-pay

## 2020-09-19 VITALS — BP 128/71 | HR 65 | Ht 62.0 in | Wt 189.6 lb

## 2020-09-19 DIAGNOSIS — I482 Chronic atrial fibrillation, unspecified: Secondary | ICD-10-CM

## 2020-09-19 DIAGNOSIS — I10 Essential (primary) hypertension: Secondary | ICD-10-CM

## 2020-09-19 DIAGNOSIS — I5032 Chronic diastolic (congestive) heart failure: Secondary | ICD-10-CM

## 2020-09-19 MED ORDER — MEMANTINE HCL 10 MG PO TABS: 10.0000 mg | ORAL_TABLET | Freq: Two times a day (BID) | ORAL | Status: DC

## 2020-10-07 ENCOUNTER — Other Ambulatory Visit: Payer: Self-pay | Admitting: Cardiology

## 2020-11-22 ENCOUNTER — Other Ambulatory Visit: Payer: Self-pay | Admitting: Cardiology

## 2020-11-22 NOTE — Telephone Encounter (Signed)
Prescription refill request for Xarelto received.  Indication:atrial fib Last office visit:2/22  [place] Weight:86 kg Age:87 Scr:1.1  6/21 CrCl:47.07 ml/min  Prescription refilled

## 2021-02-06 DIAGNOSIS — E039 Hypothyroidism, unspecified: Secondary | ICD-10-CM | POA: Diagnosis not present

## 2021-02-06 DIAGNOSIS — E559 Vitamin D deficiency, unspecified: Secondary | ICD-10-CM | POA: Diagnosis not present

## 2021-02-06 DIAGNOSIS — E785 Hyperlipidemia, unspecified: Secondary | ICD-10-CM | POA: Diagnosis not present

## 2021-02-06 DIAGNOSIS — I10 Essential (primary) hypertension: Secondary | ICD-10-CM | POA: Diagnosis not present

## 2021-02-13 DIAGNOSIS — R82998 Other abnormal findings in urine: Secondary | ICD-10-CM | POA: Diagnosis not present

## 2021-02-13 DIAGNOSIS — E039 Hypothyroidism, unspecified: Secondary | ICD-10-CM | POA: Diagnosis not present

## 2021-02-13 DIAGNOSIS — R0600 Dyspnea, unspecified: Secondary | ICD-10-CM | POA: Diagnosis not present

## 2021-02-13 DIAGNOSIS — I4891 Unspecified atrial fibrillation: Secondary | ICD-10-CM | POA: Diagnosis not present

## 2021-02-13 DIAGNOSIS — Z0000 Encounter for general adult medical examination without abnormal findings: Secondary | ICD-10-CM | POA: Diagnosis not present

## 2021-02-13 DIAGNOSIS — D6869 Other thrombophilia: Secondary | ICD-10-CM | POA: Diagnosis not present

## 2021-02-13 DIAGNOSIS — Z1389 Encounter for screening for other disorder: Secondary | ICD-10-CM | POA: Diagnosis not present

## 2021-02-13 DIAGNOSIS — I10 Essential (primary) hypertension: Secondary | ICD-10-CM | POA: Diagnosis not present

## 2021-02-13 DIAGNOSIS — E785 Hyperlipidemia, unspecified: Secondary | ICD-10-CM | POA: Diagnosis not present

## 2021-02-13 DIAGNOSIS — E559 Vitamin D deficiency, unspecified: Secondary | ICD-10-CM | POA: Diagnosis not present

## 2021-02-13 DIAGNOSIS — D692 Other nonthrombocytopenic purpura: Secondary | ICD-10-CM | POA: Diagnosis not present

## 2021-02-13 DIAGNOSIS — Z1331 Encounter for screening for depression: Secondary | ICD-10-CM | POA: Diagnosis not present

## 2021-03-02 DIAGNOSIS — M8589 Other specified disorders of bone density and structure, multiple sites: Secondary | ICD-10-CM | POA: Diagnosis not present

## 2021-03-07 ENCOUNTER — Other Ambulatory Visit: Payer: Self-pay | Admitting: Cardiology

## 2021-03-20 DIAGNOSIS — Z20822 Contact with and (suspected) exposure to covid-19: Secondary | ICD-10-CM | POA: Diagnosis not present

## 2021-04-04 DIAGNOSIS — Z961 Presence of intraocular lens: Secondary | ICD-10-CM | POA: Diagnosis not present

## 2021-04-04 DIAGNOSIS — H5212 Myopia, left eye: Secondary | ICD-10-CM | POA: Diagnosis not present

## 2021-04-13 NOTE — Progress Notes (Signed)
Cheyenne Cruz Date of Birth: Nov 09, 1928 Medical Record [id number]  History of Present Illness: Cheyenne Cruz is seen for follow up of atrial fibrillation and CHF. She was seen in 2011 with symptoms of dyspnea. She had an Echo at that time that showed Normal LV function, AV sclerosis, and mild MR. A stress test showed very poor exercise tolerance. She had a cardiac cath that showed no CAD and normal right heart pressures.She had pulmonary evaluation at that time with Dr. Gwenette Greet with normal PFTs.  In 2015 she was found to be in atrial fibrillation.  She was asymptomatic.   Started on metoprolol and Xarelto. She later developed findings of diastolic CHF. She was placed on lasix with improvement.   She was seen by Dr Mariea Clonts on 10/18/16 and noted to have weight gain, increased edema and marked increase in SOB. Lasix increased to 40 mg bid. She was seen and noted that she could not do anything due to SOB and marked fatigue. No cough or chest pain. Marked edema. When in our office lasix increased further to 60 mg bid and potassium added.  Echo performed on 11/07/16 showed low normal LV function with moderate biatrial enlargment. No significant valvular disease. When seen in March 2018 she was doing much better with a 12 lb weight loss. Metolazone was changed to prn. Home oxygen was discontinued.   On follow up today she is doing OK. Her husband recently passed away and then she got Covid at the funeral. States she had a bad reaction to medication she took for Covid. She does note she gets SOB with walking down a long hall. She has lost 9 lbs. Appetite is off. Swelling is doing well except she bumped her right leg and it is swollen.    Allergies as of 04/17/2021   No Known Allergies      Medication List        Accurate as of April 17, 2021  9:55 AM. If you have any questions, ask your nurse or doctor.          acetaminophen 325 MG tablet Commonly known as: TYLENOL Take 650 mg by mouth every 6  (six) hours as needed.   atorvastatin 10 MG tablet Commonly known as: LIPITOR Take 1 tablet (10 mg total) by mouth daily.   calcium carbonate 600 MG Tabs tablet Commonly known as: OS-CAL Take 600 mg by mouth. Take one tablet twice daily   furosemide 40 MG tablet Commonly known as: LASIX TAKE ONE TABLET DAILY   levothyroxine 50 MCG tablet Commonly known as: SYNTHROID Take 1 tablet (50 mcg total) by mouth daily.   metoprolol tartrate 25 MG tablet Commonly known as: LOPRESSOR TAKE ONE TABLET TWICE DAILY   multivitamin tablet Take 1 tablet by mouth daily. Take 1 tablet once a day.   Vitamin D-3 25 MCG (1000 UT) Caps Take 1 capsule by mouth daily.   Xarelto 15 MG Tabs tablet Generic drug: Rivaroxaban TAKE ONE TABLET EACH DAY WITH SUPPER        No Known Allergies  Past Medical History:  Diagnosis Date   Abnormality of gait 10/09/2008   Aortic valve sclerosis    Atrial fibrillation, persistent (HCC)    Closed fracture of shaft of femur ([place]) 11.14/2012   Enthesopathy of unspecified site 03/19/2009   Fatigue    H/O total hysterectomy    Hyperlipidemia    Hypertension    MVP (mitral valve prolapse)    Myalgia and myositis, unspecified 08/06/2010  Nonspecific abnormal results of pulmonary system function study 08/06/2010   Nontoxic uninodular goiter 08/06/2010   OA (osteoarthritis)    Obesity    Osteoporosis, senile 08/15/2004   Other abnormal blood chemistry 06/25/2010   Overweight and obesity(278.0) 11/11/2012   Pain in joint, ankle and foot 12/28/2008   Plantar fasciitis    Shortness of breath    WITH EXERTION   Thyroid disease    Unspecified asthma, with exacerbation 12/02/2005   Unspecified essential hypertension 08/15/2004   Unspecified hypothyroidism 08/06/2010   Vitamin D deficiency     Past Surgical History:  Procedure Laterality Date   ABDOMINAL HYSTERECTOMY  2001   Total Dr. Rexford Maus   BASAL CELL CARCINOMA EXCISION  2006   Above left side of  lip   CATARACT EXTRACTION W/ INTRAOCULAR LENS  IMPLANT, BILATERAL  08/2015   Dr. Maricela Curet   COLONOSCOPY  09/27/2008   Dr. Sammuel Cooper normal   [place] Left 2012   hip ORIF   SKIN CANCER EXCISION  2003   NOSE   TONSILLECTOMY      Social History   Socioeconomic History   Marital status: Married    Spouse name: Not on file   Number of children: Not on file   Years of education: Not on file   Highest education level: Not on file  Occupational History   Occupation: RETIRED tearcher    Comment: ELEMENTARY SCHOOL TEACHER  Tobacco Use   Smoking status: Never   Smokeless tobacco: Never  Vaping Use   Vaping Use: Never used  Substance and Sexual Activity   Alcohol use: Yes    Alcohol/week: 3.0 - 4.0 standard drinks    Types: 3 - 4 Glasses of wine per week    Comment: OCCASIONAL GLASS OF WINE   Drug use: No   Sexual activity: Not Currently  Other Topics Concern   Not on file  Social History Narrative   Married 1954, [name].    Retired Pharmacist, hospital.    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 Spends summers in [place].    No smoking history    drinks minimal amount of alcohol.   Exercise predominantly weight training, walking dog   DNR   Social Determinants of Health   Financial Resource Strain: Not on file  Food Insecurity: Not on file  Transportation Needs: Not on file  Physical Activity: Not on file  Stress: Not on file  Social Connections: Not on file    Family History  Problem Relation Age of Onset   Stroke Mother 10       FOLLOWING A BROKEN HIP   Stroke Father    Aneurysm Father        Abdominal aortic, repaired    Review of Systems: As noted in HPI.  All other systems were reviewed and are negative.  Physical Exam: BP 120/70   Pulse 74   Resp 20   Ht '5\' 2"'$  (1.575 m)   Wt 178 lb 6.4 oz (80.9 kg)   SpO2 96%   BMI 32.63 kg/m  Filed Weights   04/17/21 0926   Weight: 178 lb 6.4 oz (80.9 kg)     GENERAL:  Well appearing, overweight WF in NAD HEENT:  PERRL, EOMI, sclera are clear. Oropharynx is clear. NECK:  No jugular venous distention, carotid upstroke brisk and symmetric, no bruits, no thyromegaly or adenopathy LUNGS:  Clear  to auscultation bilaterally CHEST:  Unremarkable HEART:  IRRR,  PMI not displaced or sustained,S1 and S2 within normal limits, no S3, no S4: no clicks, no rubs, no murmurs ABD:  Soft, nontender. BS +, no masses or bruits. No hepatomegaly, no splenomegaly EXT:  2 + pulses throughout, 1+ RLE edema, no cyanosis no clubbing SKIN:  Warm and dry.  No rashes NEURO:  Alert and oriented x 3. Cranial nerves II through XII intact. PSYCH:  Cognitively intact     LABORATORY DATA: Lab Results  Component Value Date   WBC 6.6 09/13/2016   HGB 12.2 09/13/2016   HCT 36.0 09/13/2016   PLT 290 09/13/2016   GLUCOSE 142 (H) 11/04/2016   CHOL 178 05/07/2016   TRIG 85 05/07/2016   HDL 87 (A) 05/07/2016   LDLCALC 74 05/07/2016   ALT 15 06/13/2015   AST 19 06/13/2015   NA 140 11/04/2016   K 4.3 11/04/2016   CL 98 11/04/2016   CREATININE 0.87 11/04/2016   BUN 18 11/04/2016   CO2 32 (H) 11/04/2016   TSH 3.63 05/07/2016   HGBA1C 6.0 05/07/2016   Labs dated 01/01/17: cholesterol 181, triglycerides 83, HDL 55, LDL 109. CMET, TSH, and CBC normal.  Dated 01/05/18: cholesterol 186, triglycerides 71, HDL 61, LDL 111. Creatinine 1.1. other chemistries and CBC normal Dated 01/12/19: cholesterol 195, triglycerides 69, HDL 63, LDL 118. A1c 5,6%, creatinine 1.1. otherwise CBC, CMET and TSH normal.  Dated 01/25/20: A1c 5.5%. cholesterol 184, triglycerides 104, HDL 67, LDL 96. Creatinine 1.1. otherwise CBC, CMET and TFTs normal.   Echo 11/07/16: Study Conclusions   - Left ventricle: The cavity size was normal. Wall thickness was   normal. Systolic function was normal. The estimated ejection   fraction was in the range of 50% to 55%. Wall motion  was normal;   there were no regional wall motion abnormalities. - Aortic valve: There was trivial regurgitation. - Mitral valve: Systolic bowing without prolapse. - Left atrium: The atrium was moderately dilated. - Right atrium: The atrium was moderately dilated. - Atrial septum: No defect or patent foramen ovale was identified.   Assessment / Plan: 1. Atrial fibrillation chronic.  Rate is  well controlled on metoprolol.  Anticoagulated with Xarelto with elevated CHAD-vasc score of 4. Continue current therapy  2. HTN controlled. On metoprolol and lasix.  3.  Chronic diastolic CHF. She appears euvolemic today.  Weight is down 9 lbs.   She has chronic class 2 symptoms.  Continue current lasix dose.  Sodium restriction.   4. Hypercholesterolemia. Will request copy of recent lab work.   I will follow up in 6 months

## 2021-04-17 ENCOUNTER — Encounter: Payer: Self-pay | Admitting: Cardiology

## 2021-04-17 ENCOUNTER — Ambulatory Visit (INDEPENDENT_AMBULATORY_CARE_PROVIDER_SITE_OTHER): Payer: Medicare Other | Admitting: Cardiology

## 2021-04-17 ENCOUNTER — Other Ambulatory Visit: Payer: Self-pay

## 2021-04-17 VITALS — BP 120/70 | HR 74 | Resp 20 | Ht 62.0 in | Wt 178.4 lb

## 2021-04-17 DIAGNOSIS — I5032 Chronic diastolic (congestive) heart failure: Secondary | ICD-10-CM

## 2021-04-17 DIAGNOSIS — I10 Essential (primary) hypertension: Secondary | ICD-10-CM | POA: Diagnosis not present

## 2021-04-17 DIAGNOSIS — I482 Chronic atrial fibrillation, unspecified: Secondary | ICD-10-CM | POA: Diagnosis not present

## 2021-05-25 DIAGNOSIS — M545 Low back pain, unspecified: Secondary | ICD-10-CM | POA: Diagnosis not present

## 2021-05-25 DIAGNOSIS — M8580 Other specified disorders of bone density and structure, unspecified site: Secondary | ICD-10-CM | POA: Diagnosis not present

## 2021-05-25 DIAGNOSIS — N1831 Chronic kidney disease, stage 3a: Secondary | ICD-10-CM | POA: Diagnosis not present

## 2021-05-28 DIAGNOSIS — L820 Inflamed seborrheic keratosis: Secondary | ICD-10-CM | POA: Diagnosis not present

## 2021-05-28 DIAGNOSIS — L918 Other hypertrophic disorders of the skin: Secondary | ICD-10-CM | POA: Diagnosis not present

## 2021-05-28 DIAGNOSIS — Z85828 Personal history of other malignant neoplasm of skin: Secondary | ICD-10-CM | POA: Diagnosis not present

## 2021-05-28 DIAGNOSIS — L821 Other seborrheic keratosis: Secondary | ICD-10-CM | POA: Diagnosis not present

## 2021-05-28 DIAGNOSIS — D692 Other nonthrombocytopenic purpura: Secondary | ICD-10-CM | POA: Diagnosis not present

## 2021-06-01 DIAGNOSIS — Z23 Encounter for immunization: Secondary | ICD-10-CM | POA: Diagnosis not present

## 2021-06-08 DIAGNOSIS — Z23 Encounter for immunization: Secondary | ICD-10-CM | POA: Diagnosis not present

## 2021-07-13 ENCOUNTER — Other Ambulatory Visit: Payer: Self-pay | Admitting: Cardiology

## 2021-07-16 NOTE — Telephone Encounter (Signed)
Prescription refill request for Xarelto received.  Indication:Afib Last office visit:8/22 Weight:80.9 kg Age:87 Scr:1.0 CrCl:47.75 ml/min  Prescription refilled

## 2021-08-22 DIAGNOSIS — I10 Essential (primary) hypertension: Secondary | ICD-10-CM | POA: Diagnosis not present

## 2021-08-22 DIAGNOSIS — I5032 Chronic diastolic (congestive) heart failure: Secondary | ICD-10-CM | POA: Diagnosis not present

## 2021-08-22 DIAGNOSIS — I4891 Unspecified atrial fibrillation: Secondary | ICD-10-CM | POA: Diagnosis not present

## 2021-08-22 DIAGNOSIS — E785 Hyperlipidemia, unspecified: Secondary | ICD-10-CM | POA: Diagnosis not present

## 2021-08-22 DIAGNOSIS — E559 Vitamin D deficiency, unspecified: Secondary | ICD-10-CM | POA: Diagnosis not present

## 2021-08-22 DIAGNOSIS — E039 Hypothyroidism, unspecified: Secondary | ICD-10-CM | POA: Diagnosis not present

## 2021-08-22 DIAGNOSIS — D6869 Other thrombophilia: Secondary | ICD-10-CM | POA: Diagnosis not present

## 2021-08-22 DIAGNOSIS — G4700 Insomnia, unspecified: Secondary | ICD-10-CM | POA: Diagnosis not present

## 2021-08-22 DIAGNOSIS — D692 Other nonthrombocytopenic purpura: Secondary | ICD-10-CM | POA: Diagnosis not present

## 2021-10-25 NOTE — Progress Notes (Unsigned)
Cheyenne Cruz Date of Birth: [birth year] Medical Record #[id number]  History of Present Illness: Cheyenne Cruz is seen for follow up of atrial fibrillation and CHF. She was seen in 2011 with symptoms of dyspnea. She had an Echo at that time that showed Normal LV function, AV sclerosis, and mild MR. A stress test showed very poor exercise tolerance. She had a cardiac cath that showed no CAD and normal right heart pressures.She had pulmonary evaluation at that time with Dr. Gwenette Greet with normal PFTs.  In 2015 she was found to be in atrial fibrillation.  She was asymptomatic.   Started on metoprolol and Xarelto. She later developed findings of diastolic CHF. She was placed on lasix with improvement.   She was seen by Dr Mariea Clonts on 10/18/16 and noted to have weight gain, increased edema and marked increase in SOB. Lasix increased to 40 mg bid. She was seen and noted that she could not do anything due to SOB and marked fatigue. No cough or chest pain. Marked edema. When in our office lasix increased further to 60 mg bid and potassium added.  Echo performed on 11/07/16 showed low normal LV function with moderate biatrial enlargment. No significant valvular disease. When seen in March 2018 she was doing much better with a 12 lb weight loss. Metolazone was changed to prn. Home oxygen was discontinued.   On follow up today she is doing OK. Her husband recently passed away and then she got Covid at the funeral. States she had a bad reaction to medication she took for Covid. She does note she gets SOB with walking down a long hall. She has lost 9 lbs. Appetite is off. Swelling is doing well except she bumped her right leg and it is swollen.    Allergies as of 10/30/2021   No Known Allergies      Medication List        Accurate as of October 25, 2021  7:35 AM. If you have any questions, ask your nurse or doctor.          acetaminophen 325 MG tablet Commonly known as: TYLENOL Take 650 mg by mouth every 6 (six)  hours as needed.   atorvastatin 10 MG tablet Commonly known as: LIPITOR Take 1 tablet (10 mg total) by mouth daily.   calcium carbonate 600 MG Tabs tablet Commonly known as: OS-CAL Take 600 mg by mouth. Take one tablet twice daily   furosemide 40 MG tablet Commonly known as: LASIX TAKE ONE TABLET DAILY   levothyroxine 50 MCG tablet Commonly known as: SYNTHROID Take 1 tablet (50 mcg total) by mouth daily.   metoprolol tartrate 25 MG tablet Commonly known as: LOPRESSOR TAKE ONE TABLET TWICE DAILY   multivitamin tablet Take 1 tablet by mouth daily. Take 1 tablet once a day.   Vitamin D-3 25 MCG (1000 UT) Caps Take 1 capsule by mouth daily.   Xarelto 15 MG Tabs tablet Generic drug: Rivaroxaban TAKE ONE TABLET EACH DAY WITH SUPPER        No Known Allergies  Past Medical History:  Diagnosis Date   Abnormality of gait 10/09/2008   Aortic valve sclerosis    Atrial fibrillation, persistent (HCC)    Closed fracture of shaft of femur ([place]) 11.14/2012   Enthesopathy of unspecified site 03/19/2009   Fatigue    H/O total hysterectomy    Hyperlipidemia    Hypertension    MVP (mitral valve prolapse)    Myalgia and myositis, unspecified 08/06/2010  Nonspecific abnormal results of pulmonary system function study 08/06/2010   Nontoxic uninodular goiter 08/06/2010   OA (osteoarthritis)    Obesity    Osteoporosis, senile 08/15/2004   Other abnormal blood chemistry 06/25/2010   Overweight and obesity(278.0) 11/11/2012   Pain in joint, ankle and foot 12/28/2008   Plantar fasciitis    Shortness of breath    WITH EXERTION   Thyroid disease    Unspecified asthma, with exacerbation 12/02/2005   Unspecified essential hypertension 08/15/2004   Unspecified hypothyroidism 08/06/2010   Vitamin D deficiency     Past Surgical History:  Procedure Laterality Date   ABDOMINAL HYSTERECTOMY  2001   Total Dr. Rexford Maus   BASAL CELL CARCINOMA EXCISION  2006   Above left side of lip    CATARACT EXTRACTION W/ INTRAOCULAR LENS  IMPLANT, BILATERAL  08/2015   Dr. Maricela Curet   COLONOSCOPY  09/27/2008   Dr. Sammuel Cooper normal   [place] Left 2012   hip ORIF   SKIN CANCER EXCISION  2003   NOSE   TONSILLECTOMY      Social History   Socioeconomic History   Marital status: Married    Spouse name: Not on file   Number of children: Not on file   Years of education: Not on file   Highest education level: Not on file  Occupational History   Occupation: RETIRED tearcher    Comment: ELEMENTARY SCHOOL TEACHER  Tobacco Use   Smoking status: Never   Smokeless tobacco: Never  Vaping Use   Vaping Use: Never used  Substance and Sexual Activity   Alcohol use: Yes    Alcohol/week: 3.0 - 4.0 standard drinks    Types: 3 - 4 Glasses of wine per week    Comment: OCCASIONAL GLASS OF WINE   Drug use: No   Sexual activity: Not Currently  Other Topics Concern   Not on file  Social History Narrative   Married 1954, [name].    Retired Pharmacist, hospital.    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 Spends summers in [place].    No smoking history    drinks minimal amount of alcohol.   Exercise predominantly weight training, walking dog   DNR   Social Determinants of Health   Financial Resource Strain: Not on file  Food Insecurity: Not on file  Transportation Needs: Not on file  Physical Activity: Not on file  Stress: Not on file  Social Connections: Not on file    Family History  Problem Relation Age of Onset   Stroke Mother 49       FOLLOWING A BROKEN HIP   Stroke Father    Aneurysm Father        Abdominal aortic, repaired    Review of Systems: As noted in HPI.  All other systems were reviewed and are negative.  Physical Exam: There were no vitals taken for this visit. There were no vitals filed for this visit.    GENERAL:  Well appearing, overweight WF in NAD HEENT:  PERRL,  EOMI, sclera are clear. Oropharynx is clear. NECK:  No jugular venous distention, carotid upstroke brisk and symmetric, no bruits, no thyromegaly or adenopathy LUNGS:  Clear to auscultation bilaterally CHEST:  Unremarkable HEART:  IRRR,  PMI not displaced or sustained,S1 and S2 within normal limits, no S3, no S4: no clicks, no rubs, no murmurs ABD:  Soft, nontender. BS +,  no masses or bruits. No hepatomegaly, no splenomegaly EXT:  2 + pulses throughout, 1+ RLE edema, no cyanosis no clubbing SKIN:  Warm and dry.  No rashes NEURO:  Alert and oriented x 3. Cranial nerves II through XII intact. PSYCH:  Cognitively intact     LABORATORY DATA: Lab Results  Component Value Date   WBC 6.6 09/13/2016   HGB 12.2 09/13/2016   HCT 36.0 09/13/2016   PLT 290 09/13/2016   GLUCOSE 142 (H) 11/04/2016   CHOL 178 05/07/2016   TRIG 85 05/07/2016   HDL 87 (A) 05/07/2016   LDLCALC 74 05/07/2016   ALT 15 06/13/2015   AST 19 06/13/2015   NA 140 11/04/2016   K 4.3 11/04/2016   CL 98 11/04/2016   CREATININE 0.87 11/04/2016   BUN 18 11/04/2016   CO2 32 (H) 11/04/2016   TSH 3.63 05/07/2016   HGBA1C 6.0 05/07/2016   Labs dated 01/01/17: cholesterol 181, triglycerides 83, HDL 55, LDL 109. CMET, TSH, and CBC normal.  Dated 01/05/18: cholesterol 186, triglycerides 71, HDL 61, LDL 111. Creatinine 1.1. other chemistries and CBC normal Dated 01/12/19: cholesterol 195, triglycerides 69, HDL 63, LDL 118. A1c 5,6%, creatinine 1.1. otherwise CBC, CMET and TSH normal.  Dated 01/25/20: A1c 5.5%. cholesterol 184, triglycerides 104, HDL 67, LDL 96. Creatinine 1.1. otherwise CBC, CMET and TFTs normal. Dated 02/06/21: cholesterol 180, triglycerides 83, HDL 76, LDL 87.  Dated 05/25/21: CMET normal   Echo 11/07/16: Study Conclusions   - Left ventricle: The cavity size was normal. Wall thickness was   normal. Systolic function was normal. The estimated ejection   fraction was in the range of 50% to 55%. Wall motion was  normal;   there were no regional wall motion abnormalities. - Aortic valve: There was trivial regurgitation. - Mitral valve: Systolic bowing without prolapse. - Left atrium: The atrium was moderately dilated. - Right atrium: The atrium was moderately dilated. - Atrial septum: No defect or patent foramen ovale was identified.   Assessment / Plan: 1. Atrial fibrillation chronic.  Rate is  well controlled on metoprolol.  Anticoagulated with Xarelto with elevated CHAD-vasc score of 4. Continue current therapy  2. HTN controlled. On metoprolol and lasix.  3.  Chronic diastolic CHF. She appears euvolemic today.  Weight is down 9 lbs.   She has chronic class 2 symptoms.  Continue current lasix dose.  Sodium restriction.   4. Hypercholesterolemia. Will request copy of recent lab work.   I will follow up in 6 months

## 2021-10-30 ENCOUNTER — Ambulatory Visit (INDEPENDENT_AMBULATORY_CARE_PROVIDER_SITE_OTHER): Payer: Medicare Other | Admitting: Cardiology

## 2021-10-30 ENCOUNTER — Encounter: Payer: Self-pay | Admitting: Cardiology

## 2021-10-30 ENCOUNTER — Other Ambulatory Visit: Payer: Self-pay

## 2021-10-30 VITALS — BP 120/60 | HR 60 | Ht 62.0 in | Wt 166.0 lb

## 2021-10-30 DIAGNOSIS — I5032 Chronic diastolic (congestive) heart failure: Secondary | ICD-10-CM | POA: Diagnosis not present

## 2021-10-30 DIAGNOSIS — I482 Chronic atrial fibrillation, unspecified: Secondary | ICD-10-CM

## 2021-10-30 DIAGNOSIS — I10 Essential (primary) hypertension: Secondary | ICD-10-CM

## 2021-10-30 MED ORDER — ATORVASTATIN CALCIUM 10 MG PO TABS: 10.0000 mg | ORAL_TABLET | Freq: Every day | ORAL | 3 refills | Status: DC

## 2021-11-29 ENCOUNTER — Other Ambulatory Visit: Payer: Self-pay | Admitting: Cardiology

## 2021-12-06 DIAGNOSIS — R0781 Pleurodynia: Secondary | ICD-10-CM | POA: Diagnosis not present

## 2022-01-16 ENCOUNTER — Other Ambulatory Visit: Payer: Self-pay | Admitting: Cardiology

## 2022-01-17 NOTE — Telephone Encounter (Signed)
Prescription refill request for Xarelto received.  Indication:Afib Last office visit:3/23 Weight:75.3 kg Age:88 Scr:1.0 CrCl:44.45 ml/min  Prescription refilled

## 2022-03-01 DIAGNOSIS — I10 Essential (primary) hypertension: Secondary | ICD-10-CM | POA: Diagnosis not present

## 2022-03-01 DIAGNOSIS — E039 Hypothyroidism, unspecified: Secondary | ICD-10-CM | POA: Diagnosis not present

## 2022-03-01 DIAGNOSIS — R7301 Impaired fasting glucose: Secondary | ICD-10-CM | POA: Diagnosis not present

## 2022-03-01 DIAGNOSIS — E559 Vitamin D deficiency, unspecified: Secondary | ICD-10-CM | POA: Diagnosis not present

## 2022-03-01 DIAGNOSIS — E785 Hyperlipidemia, unspecified: Secondary | ICD-10-CM | POA: Diagnosis not present

## 2022-03-08 DIAGNOSIS — I5032 Chronic diastolic (congestive) heart failure: Secondary | ICD-10-CM | POA: Diagnosis not present

## 2022-03-08 DIAGNOSIS — I10 Essential (primary) hypertension: Secondary | ICD-10-CM | POA: Diagnosis not present

## 2022-03-08 DIAGNOSIS — E785 Hyperlipidemia, unspecified: Secondary | ICD-10-CM | POA: Diagnosis not present

## 2022-03-08 DIAGNOSIS — Z1339 Encounter for screening examination for other mental health and behavioral disorders: Secondary | ICD-10-CM | POA: Diagnosis not present

## 2022-03-08 DIAGNOSIS — D6869 Other thrombophilia: Secondary | ICD-10-CM | POA: Diagnosis not present

## 2022-03-08 DIAGNOSIS — I4891 Unspecified atrial fibrillation: Secondary | ICD-10-CM | POA: Diagnosis not present

## 2022-03-08 DIAGNOSIS — E559 Vitamin D deficiency, unspecified: Secondary | ICD-10-CM | POA: Diagnosis not present

## 2022-03-08 DIAGNOSIS — G4700 Insomnia, unspecified: Secondary | ICD-10-CM | POA: Diagnosis not present

## 2022-03-08 DIAGNOSIS — Z0000 Encounter for general adult medical examination without abnormal findings: Secondary | ICD-10-CM | POA: Diagnosis not present

## 2022-03-08 DIAGNOSIS — E039 Hypothyroidism, unspecified: Secondary | ICD-10-CM | POA: Diagnosis not present

## 2022-03-08 DIAGNOSIS — Z1331 Encounter for screening for depression: Secondary | ICD-10-CM | POA: Diagnosis not present

## 2022-03-08 DIAGNOSIS — D692 Other nonthrombocytopenic purpura: Secondary | ICD-10-CM | POA: Diagnosis not present

## 2022-04-04 DIAGNOSIS — M479 Spondylosis, unspecified: Secondary | ICD-10-CM | POA: Diagnosis not present

## 2022-04-04 DIAGNOSIS — M8580 Other specified disorders of bone density and structure, unspecified site: Secondary | ICD-10-CM | POA: Diagnosis not present

## 2022-04-04 DIAGNOSIS — M545 Low back pain, unspecified: Secondary | ICD-10-CM | POA: Diagnosis not present

## 2022-04-04 DIAGNOSIS — M546 Pain in thoracic spine: Secondary | ICD-10-CM | POA: Diagnosis not present

## 2022-04-04 DIAGNOSIS — N1831 Chronic kidney disease, stage 3a: Secondary | ICD-10-CM | POA: Diagnosis not present

## 2022-04-10 DIAGNOSIS — Z961 Presence of intraocular lens: Secondary | ICD-10-CM | POA: Diagnosis not present

## 2022-04-10 DIAGNOSIS — H5211 Myopia, right eye: Secondary | ICD-10-CM | POA: Diagnosis not present

## 2022-04-30 NOTE — Progress Notes (Signed)
Gerald Leitz Date of Birth: [birth year] Medical Record #[id number]  History of Present Illness: Cheyenne Cruz is seen for follow up of atrial fibrillation and CHF. She was seen in 2011 with symptoms of dyspnea. She had an Echo at that time that showed Normal LV function, AV sclerosis, and mild MR. A stress test showed very poor exercise tolerance. She had a cardiac cath that showed no CAD and normal right heart pressures.She had pulmonary evaluation at that time with Dr. Gwenette Greet with normal PFTs.  In 2015 she was found to be in atrial fibrillation.  She was asymptomatic.   Started on metoprolol and Xarelto. She later developed findings of diastolic CHF. She was placed on lasix with improvement.   She was seen by Dr Mariea Clonts on 10/18/16 and noted to have weight gain, increased edema and marked increase in SOB. Lasix increased to 40 mg bid. She was seen and noted that she could not do anything due to SOB and marked fatigue. No cough or chest pain. Marked edema. When in our office lasix increased further to 60 mg bid and potassium added.  Echo performed on 11/07/16 showed low normal LV function with moderate biatrial enlargment. No significant valvular disease. When seen in March 2018 she was doing much better with a 12 lb weight loss. Metolazone was changed to prn. Home oxygen was discontinued.   On follow up today she is doing OK.  She is walking a lot with a walker. Thinks she has slowed down some but still tries to walk a lot and eat healthy. No SOB or chest pian. Not aware of afib.   Swelling is doing well - just wears light compression. She is in a single apartment at [place]. Still adjusting to loss of her husband.    Allergies as of 05/03/2022   No Known Allergies      Medication List        Accurate as of May 03, 2022  1:17 PM. If you have any questions, ask your nurse or doctor.          acetaminophen 325 MG tablet Commonly known as: TYLENOL Take 650 mg by mouth every 6 (six)  hours as needed.   atorvastatin 10 MG tablet Commonly known as: LIPITOR Take 1 tablet (10 mg total) by mouth daily.   calcium carbonate 600 MG Tabs tablet Commonly known as: OS-CAL Take 600 mg by mouth. Take one tablet twice daily   furosemide 40 MG tablet Commonly known as: LASIX TAKE ONE TABLET DAILY   levothyroxine 50 MCG tablet Commonly known as: SYNTHROID Take 1 tablet (50 mcg total) by mouth daily.   metoprolol tartrate 25 MG tablet Commonly known as: LOPRESSOR TAKE ONE TABLET TWICE DAILY   multivitamin tablet Take 1 tablet by mouth daily. Take 1 tablet once a day.   Vitamin D-3 25 MCG (1000 UT) Caps Take 1 capsule by mouth daily.   Xarelto 15 MG Tabs tablet Generic drug: Rivaroxaban TAKE ONE TABLET EACH DAY WITH SUPPER        No Known Allergies  Past Medical History:  Diagnosis Date   Abnormality of gait 10/09/2008   Aortic valve sclerosis    Atrial fibrillation, persistent (HCC)    Closed fracture of shaft of femur ([place]) 11.14/2012   Enthesopathy of unspecified site 03/19/2009   Fatigue    H/O total hysterectomy    Hyperlipidemia    Hypertension    MVP (mitral valve prolapse)    Myalgia and myositis,  unspecified 08/06/2010   Nonspecific abnormal results of pulmonary system function study 08/06/2010   Nontoxic uninodular goiter 08/06/2010   OA (osteoarthritis)    Obesity    Osteoporosis, senile 08/15/2004   Other abnormal blood chemistry 06/25/2010   Overweight and obesity(278.0) 11/11/2012   Pain in joint, ankle and foot 12/28/2008   Plantar fasciitis    Shortness of breath    WITH EXERTION   Thyroid disease    Unspecified asthma, with exacerbation 12/02/2005   Unspecified essential hypertension 08/15/2004   Unspecified hypothyroidism 08/06/2010   Vitamin D deficiency     Past Surgical History:  Procedure Laterality Date   ABDOMINAL HYSTERECTOMY  2001   Total Dr. Rexford Maus   BASAL CELL CARCINOMA EXCISION  2006   Above left side of lip    CATARACT EXTRACTION W/ INTRAOCULAR LENS  IMPLANT, BILATERAL  08/2015   Dr. Maricela Curet   COLONOSCOPY  09/27/2008   Dr. Sammuel Cooper normal   [place] Left 2012   hip ORIF   SKIN CANCER EXCISION  2003   NOSE   TONSILLECTOMY      Social History   Socioeconomic History   Marital status: Married    Spouse name: Not on file   Number of children: Not on file   Years of education: Not on file   Highest education level: Not on file  Occupational History   Occupation: RETIRED tearcher    Comment: ELEMENTARY SCHOOL TEACHER  Tobacco Use   Smoking status: Never   Smokeless tobacco: Never  Vaping Use   Vaping Use: Never used  Substance and Sexual Activity   Alcohol use: Yes    Alcohol/week: 3.0 - 4.0 standard drinks of alcohol    Types: 3 - 4 Glasses of wine per week    Comment: OCCASIONAL GLASS OF WINE   Drug use: No   Sexual activity: Not Currently  Other Topics Concern   Not on file  Social History Narrative   Married 1954, [name].    Retired Pharmacist, hospital.    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 Spends summers in [place].    No smoking history    drinks minimal amount of alcohol.   Exercise predominantly weight training, walking dog   DNR   Social Determinants of Health   Financial Resource Strain: Not on file  Food Insecurity: Not on file  Transportation Needs: Not on file  Physical Activity: Not on file  Stress: Not on file  Social Connections: Not on file    Family History  Problem Relation Age of Onset   Stroke Mother 23       FOLLOWING A BROKEN HIP   Stroke Father    Aneurysm Father        Abdominal aortic, repaired    Review of Systems: As noted in HPI.  All other systems were reviewed and are negative.  Physical Exam: BP 128/76   Pulse (!) 56   Ht '5\' 5"'$  (1.651 m)   Wt 169 lb 9.6 oz (76.9 kg)   SpO2 92%   BMI 28.22 kg/m  Filed Weights   05/03/22 1305   Weight: 169 lb 9.6 oz (76.9 kg)   GENERAL:  Well appearing, overweight WF in NAD HEENT:  PERRL, EOMI, sclera are clear. Oropharynx is clear. NECK:  No jugular venous distention, carotid upstroke brisk and symmetric, no bruits, no thyromegaly or adenopathy LUNGS:  Clear to auscultation bilaterally CHEST:  Unremarkable HEART:  IRRR,  PMI not displaced or sustained,S1 and S2 within normal limits, no S3, no S4: no clicks, no rubs, no murmurs ABD:  Soft, nontender. BS +, no masses or bruits. No hepatomegaly, no splenomegaly EXT:  2 + pulses throughout, 1+ right ankle edema, no cyanosis no clubbing SKIN:  Warm and dry.  No rashes NEURO:  Alert and oriented x 3. Cranial nerves II through XII intact. PSYCH:  Cognitively intact     LABORATORY DATA: Lab Results  Component Value Date   WBC 6.6 09/13/2016   HGB 12.2 09/13/2016   HCT 36.0 09/13/2016   PLT 290 09/13/2016   GLUCOSE 142 (H) 11/04/2016   CHOL 178 05/07/2016   TRIG 85 05/07/2016   HDL 87 (A) 05/07/2016   LDLCALC 74 05/07/2016   ALT 15 06/13/2015   AST 19 06/13/2015   NA 140 11/04/2016   K 4.3 11/04/2016   CL 98 11/04/2016   CREATININE 0.87 11/04/2016   BUN 18 11/04/2016   CO2 32 (H) 11/04/2016   TSH 3.63 05/07/2016   HGBA1C 6.0 05/07/2016   Labs dated 01/01/17: cholesterol 181, triglycerides 83, HDL 55, LDL 109. CMET, TSH, and CBC normal.  Dated 01/05/18: cholesterol 186, triglycerides 71, HDL 61, LDL 111. Creatinine 1.1. other chemistries and CBC normal Dated 01/12/19: cholesterol 195, triglycerides 69, HDL 63, LDL 118. A1c 5,6%, creatinine 1.1. otherwise CBC, CMET and TSH normal.  Dated 01/25/20: A1c 5.5%. cholesterol 184, triglycerides 104, HDL 67, LDL 96. Creatinine 1.1. otherwise CBC, CMET and TFTs normal. Dated 02/06/21: cholesterol 180, triglycerides 83, HDL 76, LDL 87.  Dated 05/25/21: CMET normal Dated 03/01/22: A1c 5.6%. cholesterol 182, triglycerides 79, HDL 82, LDL 84. Creatinine 1.1. otherwise  CBC, CMET, TFTs  normal.   Ecg is not done today    Echo 11/07/16: Study Conclusions   - Left ventricle: The cavity size was normal. Wall thickness was   normal. Systolic function was normal. The estimated ejection   fraction was in the range of 50% to 55%. Wall motion was normal;   there were no regional wall motion abnormalities. - Aortic valve: There was trivial regurgitation. - Mitral valve: Systolic bowing without prolapse. - Left atrium: The atrium was moderately dilated. - Right atrium: The atrium was moderately dilated. - Atrial septum: No defect or patent foramen ovale was identified.   Assessment / Plan: 1. Atrial fibrillation chronic.  Rate is  well controlled on metoprolol.  Anticoagulated with Xarelto with elevated CHAD-vasc score of 4. Continue current therapy. CBC and renal function stable.   2. HTN controlled. On metoprolol and lasix.  3.  Chronic diastolic CHF. She appears euvolemic today.  Weight is stable.    She has chronic class 1 symptoms.  Continue current lasix dose.  Sodium restriction.   4. Hypercholesterolemia. controlled  I will follow up in 6 months

## 2022-05-03 ENCOUNTER — Encounter: Payer: Self-pay | Admitting: Cardiology

## 2022-05-03 ENCOUNTER — Ambulatory Visit: Payer: Medicare Other | Attending: Cardiology | Admitting: Cardiology

## 2022-05-03 VITALS — BP 128/76 | HR 56 | Ht 65.0 in | Wt 169.6 lb

## 2022-05-03 DIAGNOSIS — I10 Essential (primary) hypertension: Secondary | ICD-10-CM | POA: Insufficient documentation

## 2022-05-03 DIAGNOSIS — I482 Chronic atrial fibrillation, unspecified: Secondary | ICD-10-CM | POA: Insufficient documentation

## 2022-05-03 DIAGNOSIS — I5032 Chronic diastolic (congestive) heart failure: Secondary | ICD-10-CM | POA: Insufficient documentation

## 2022-05-20 ENCOUNTER — Other Ambulatory Visit (HOSPITAL_BASED_OUTPATIENT_CLINIC_OR_DEPARTMENT_OTHER): Payer: Self-pay

## 2022-05-20 DIAGNOSIS — Z23 Encounter for immunization: Secondary | ICD-10-CM | POA: Diagnosis not present

## 2022-05-20 MED ORDER — FLUAD QUADRIVALENT 0.5 ML IM PRSY
PREFILLED_SYRINGE | INTRAMUSCULAR | 0 refills | Status: DC
  Filled 2022-05-20: qty 0.5, 1d supply, fill #0

## 2022-05-28 DIAGNOSIS — L308 Other specified dermatitis: Secondary | ICD-10-CM | POA: Diagnosis not present

## 2022-05-28 DIAGNOSIS — C44311 Basal cell carcinoma of skin of nose: Secondary | ICD-10-CM | POA: Diagnosis not present

## 2022-05-28 DIAGNOSIS — L720 Epidermal cyst: Secondary | ICD-10-CM | POA: Diagnosis not present

## 2022-05-28 DIAGNOSIS — L570 Actinic keratosis: Secondary | ICD-10-CM | POA: Diagnosis not present

## 2022-05-28 DIAGNOSIS — L821 Other seborrheic keratosis: Secondary | ICD-10-CM | POA: Diagnosis not present

## 2022-05-28 DIAGNOSIS — D485 Neoplasm of uncertain behavior of skin: Secondary | ICD-10-CM | POA: Diagnosis not present

## 2022-05-28 DIAGNOSIS — Z85828 Personal history of other malignant neoplasm of skin: Secondary | ICD-10-CM | POA: Diagnosis not present

## 2022-05-28 DIAGNOSIS — D1801 Hemangioma of skin and subcutaneous tissue: Secondary | ICD-10-CM | POA: Diagnosis not present

## 2022-06-19 DIAGNOSIS — Z23 Encounter for immunization: Secondary | ICD-10-CM | POA: Diagnosis not present

## 2022-07-18 DIAGNOSIS — Z85828 Personal history of other malignant neoplasm of skin: Secondary | ICD-10-CM | POA: Diagnosis not present

## 2022-07-18 DIAGNOSIS — C44311 Basal cell carcinoma of skin of nose: Secondary | ICD-10-CM | POA: Diagnosis not present

## 2022-08-20 ENCOUNTER — Other Ambulatory Visit: Payer: Self-pay | Admitting: Cardiology

## 2022-08-20 NOTE — Telephone Encounter (Signed)
Prescription refill request for Xarelto received.  Indication:Afib Last office visit:9/23 Weight:76.9  kg Age:89 Scr:1.1 CrCl:40.44  ml/min  Prescription refilled

## 2022-09-10 DIAGNOSIS — I5032 Chronic diastolic (congestive) heart failure: Secondary | ICD-10-CM | POA: Diagnosis not present

## 2022-09-10 DIAGNOSIS — E559 Vitamin D deficiency, unspecified: Secondary | ICD-10-CM | POA: Diagnosis not present

## 2022-09-10 DIAGNOSIS — D6869 Other thrombophilia: Secondary | ICD-10-CM | POA: Diagnosis not present

## 2022-09-10 DIAGNOSIS — I10 Essential (primary) hypertension: Secondary | ICD-10-CM | POA: Diagnosis not present

## 2022-09-10 DIAGNOSIS — G4700 Insomnia, unspecified: Secondary | ICD-10-CM | POA: Diagnosis not present

## 2022-09-10 DIAGNOSIS — E785 Hyperlipidemia, unspecified: Secondary | ICD-10-CM | POA: Diagnosis not present

## 2022-09-10 DIAGNOSIS — D692 Other nonthrombocytopenic purpura: Secondary | ICD-10-CM | POA: Diagnosis not present

## 2022-09-10 DIAGNOSIS — E039 Hypothyroidism, unspecified: Secondary | ICD-10-CM | POA: Diagnosis not present

## 2022-09-10 DIAGNOSIS — I4891 Unspecified atrial fibrillation: Secondary | ICD-10-CM | POA: Diagnosis not present

## 2022-10-28 ENCOUNTER — Other Ambulatory Visit: Payer: Self-pay | Admitting: Cardiology

## 2022-10-31 NOTE — Progress Notes (Signed)
Cheyenne Cruz Date of Birth: 07-31-1929 Medical Record [id number]  History of Present Illness: Cheyenne Cruz is seen for follow up of atrial fibrillation and CHF. She was seen in 2011 with symptoms of dyspnea. She had an Echo at that time that showed Normal LV function, AV sclerosis, and mild MR. A stress test showed very poor exercise tolerance. She had a cardiac cath that showed no CAD and normal right heart pressures.She had pulmonary evaluation at that time with Dr. Gwenette Greet with normal PFTs.  In 2015 she was found to be in atrial fibrillation.  She was asymptomatic.   Started on metoprolol and Xarelto. She later developed findings of diastolic CHF. She was placed on lasix with improvement.   She was seen by Dr Mariea Clonts on 10/18/16 and noted to have weight gain, increased edema and marked increase in SOB. Lasix increased to 40 mg bid. She was seen and noted that she could not do anything due to SOB and marked fatigue. No cough or chest pain. Marked edema. When in our office lasix increased further to 60 mg bid and potassium added.  Echo performed on 11/07/16 showed low normal LV function with moderate biatrial enlargment. No significant valvular disease. When seen in March 2018 she was doing much better with a 12 lb weight loss. Metolazone was changed to prn. Home oxygen was discontinued.   On follow up today she is doing well.  She is walking a lot with a walker. States she has lost 14 lbs with walking over the past 2 years. No SOB or chest pian. Not aware of afib. Still has some swelling in legs R>L but no change.   Allergies as of 05/03/2022   No Known Allergies      Medication List        Accurate as of May 03, 2022  1:17 PM. If you have any questions, ask your nurse or doctor.          acetaminophen 325 MG tablet Commonly known as: TYLENOL Take 650 mg by mouth every 6 (six) hours as needed.   atorvastatin 10 MG tablet Commonly known as: LIPITOR Take 1 tablet (10 mg total) by  mouth daily.   calcium carbonate 600 MG Tabs tablet Commonly known as: OS-CAL Take 600 mg by mouth. Take one tablet twice daily   furosemide 40 MG tablet Commonly known as: LASIX TAKE ONE TABLET DAILY   levothyroxine 50 MCG tablet Commonly known as: SYNTHROID Take 1 tablet (50 mcg total) by mouth daily.   metoprolol tartrate 25 MG tablet Commonly known as: LOPRESSOR TAKE ONE TABLET TWICE DAILY   multivitamin tablet Take 1 tablet by mouth daily. Take 1 tablet once a day.   Vitamin D-3 25 MCG (1000 UT) Caps Take 1 capsule by mouth daily.   Xarelto 15 MG Tabs tablet Generic drug: Rivaroxaban TAKE ONE TABLET EACH DAY WITH SUPPER        No Known Allergies  Past Medical History:  Diagnosis Date   Abnormality of gait 10/09/2008   Aortic valve sclerosis    Atrial fibrillation, persistent (HCC)    Closed fracture of shaft of femur ([place]) 11.14/2012   Enthesopathy of unspecified site 03/19/2009   Fatigue    H/O total hysterectomy    Hyperlipidemia    Hypertension    MVP (mitral valve prolapse)    Myalgia and myositis, unspecified 08/06/2010   Nonspecific abnormal results of pulmonary system function study 08/06/2010   Nontoxic uninodular goiter 08/06/2010  OA (osteoarthritis)    Obesity    Osteoporosis, senile 08/15/2004   Other abnormal blood chemistry 06/25/2010   Overweight and obesity(278.0) 11/11/2012   Pain in joint, ankle and foot 12/28/2008   Plantar fasciitis    Shortness of breath    WITH EXERTION   Thyroid disease    Unspecified asthma, with exacerbation 12/02/2005   Unspecified essential hypertension 08/15/2004   Unspecified hypothyroidism 08/06/2010   Vitamin D deficiency     Past Surgical History:  Procedure Laterality Date   ABDOMINAL HYSTERECTOMY  2001   Total Dr. Rexford Maus   BASAL CELL CARCINOMA EXCISION  2006   Above left side of lip   CATARACT EXTRACTION W/ INTRAOCULAR LENS  IMPLANT, BILATERAL  08/2015   Dr. Maricela Curet   COLONOSCOPY  09/27/2008    Dr. Sammuel Cooper normal   [place] Left 2012   hip ORIF   SKIN CANCER EXCISION  2003   NOSE   TONSILLECTOMY      Social History   Socioeconomic History   Marital status: Married    Spouse name: Not on file   Number of children: Not on file   Years of education: Not on file   Highest education level: Not on file  Occupational History   Occupation: RETIRED tearcher    Comment: ELEMENTARY SCHOOL TEACHER  Tobacco Use   Smoking status: Never   Smokeless tobacco: Never  Vaping Use   Vaping Use: Never used  Substance and Sexual Activity   Alcohol use: Yes    Alcohol/week: 3.0 - 4.0 standard drinks of alcohol    Types: 3 - 4 Glasses of wine per week    Comment: OCCASIONAL GLASS OF WINE   Drug use: No   Sexual activity: Not Currently  Other Topics Concern   Not on file  Social History Narrative   Married 1954, [name].    Retired Pharmacist, hospital.    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 Spends summers in [place].    No smoking history    drinks minimal amount of alcohol.   Exercise predominantly weight training, walking dog   DNR   Social Determinants of Health   Financial Resource Strain: Not on file  Food Insecurity: Not on file  Transportation Needs: Not on file  Physical Activity: Not on file  Stress: Not on file  Social Connections: Not on file    Family History  Problem Relation Age of Onset   Stroke Mother 48       FOLLOWING A BROKEN HIP   Stroke Father    Aneurysm Father        Abdominal aortic, repaired    Review of Systems: As noted in HPI.  All other systems were reviewed and are negative.  Physical Exam: BP 128/76   Pulse (!) 56   Ht 5\' 5"  (1.651 m)   Wt 169 lb 9.6 oz (76.9 kg)   SpO2 92%   BMI 28.22 kg/m  Filed Weights   05/03/22 1305  Weight: 169 lb 9.6 oz (76.9 kg)   GENERAL:  Well appearing WF in NAD HEENT:  PERRL, EOMI, sclera are  clear. Oropharynx is clear. NECK:  No jugular venous distention, carotid upstroke brisk and symmetric, no bruits, no thyromegaly or adenopathy LUNGS:  Clear to auscultation bilaterally CHEST:  Unremarkable HEART:  IRRR,  PMI not displaced or sustained,S1 and S2 within normal limits, no  S3, no S4: no clicks, no rubs, no murmurs ABD:  Soft, nontender. BS +, no masses or bruits. No hepatomegaly, no splenomegaly EXT:  2 + pulses throughout, 1-2+ right ankle edema, tr-1+ on left. no cyanosis no clubbing SKIN:  Warm and dry.  No rashes NEURO:  Alert and oriented x 3. Cranial nerves II through XII intact. PSYCH:  Cognitively intact     LABORATORY DATA: Lab Results  Component Value Date   WBC 6.6 09/13/2016   HGB 12.2 09/13/2016   HCT 36.0 09/13/2016   PLT 290 09/13/2016   GLUCOSE 142 (H) 11/04/2016   CHOL 178 05/07/2016   TRIG 85 05/07/2016   HDL 87 (A) 05/07/2016   LDLCALC 74 05/07/2016   ALT 15 06/13/2015   AST 19 06/13/2015   NA 140 11/04/2016   K 4.3 11/04/2016   CL 98 11/04/2016   CREATININE 0.87 11/04/2016   BUN 18 11/04/2016   CO2 32 (H) 11/04/2016   TSH 3.63 05/07/2016   HGBA1C 6.0 05/07/2016   Labs dated 01/01/17: cholesterol 181, triglycerides 83, HDL 55, LDL 109. CMET, TSH, and CBC normal.  Dated 01/05/18: cholesterol 186, triglycerides 71, HDL 61, LDL 111. Creatinine 1.1. other chemistries and CBC normal Dated 01/12/19: cholesterol 195, triglycerides 69, HDL 63, LDL 118. A1c 5,6%, creatinine 1.1. otherwise CBC, CMET and TSH normal.  Dated 01/25/20: A1c 5.5%. cholesterol 184, triglycerides 104, HDL 67, LDL 96. Creatinine 1.1. otherwise CBC, CMET and TFTs normal. Dated 02/06/21: cholesterol 180, triglycerides 83, HDL 76, LDL 87.  Dated 05/25/21: CMET normal Dated 03/01/22: A1c 5.6%. cholesterol 182, triglycerides 79, HDL 82, LDL 84. Creatinine 1.1. otherwise  CBC, CMET, TFTs normal.   Ecg is done today- Afib rate 59. Possible old septal infarct. Nonspecific ST abnormality. I  have personally reviewed and interpreted this study.     Echo 11/07/16: Study Conclusions   - Left ventricle: The cavity size was normal. Wall thickness was   normal. Systolic function was normal. The estimated ejection   fraction was in the range of 50% to 55%. Wall motion was normal;   there were no regional wall motion abnormalities. - Aortic valve: There was trivial regurgitation. - Mitral valve: Systolic bowing without prolapse. - Left atrium: The atrium was moderately dilated. - Right atrium: The atrium was moderately dilated. - Atrial septum: No defect or patent foramen ovale was identified.   Assessment / Plan: 1. Atrial fibrillation chronic.  Rate is  well controlled on metoprolol.  Anticoagulated with Xarelto with elevated CHAD-vasc score of 4.   2. HTN controlled. On metoprolol and lasix.  3.  Chronic diastolic CHF. She appears euvolemic today.  Weight is stable.    She has chronic class 1 symptoms. Edema is unchanged.  Continue current lasix dose.  Sodium restriction.   4. Hypercholesterolemia. Controlled. LDL 84. Appropriate statin dose for age.   I will follow up in 6 months

## 2022-11-01 ENCOUNTER — Encounter: Payer: Self-pay | Admitting: Cardiology

## 2022-11-01 ENCOUNTER — Ambulatory Visit: Payer: Medicare Other | Attending: Cardiology | Admitting: Cardiology

## 2022-11-01 VITALS — BP 124/72 | HR 59 | Ht 65.0 in | Wt 171.8 lb

## 2022-11-01 DIAGNOSIS — I5032 Chronic diastolic (congestive) heart failure: Secondary | ICD-10-CM | POA: Insufficient documentation

## 2022-11-01 DIAGNOSIS — I482 Chronic atrial fibrillation, unspecified: Secondary | ICD-10-CM | POA: Insufficient documentation

## 2022-11-01 DIAGNOSIS — I10 Essential (primary) hypertension: Secondary | ICD-10-CM | POA: Diagnosis not present

## 2022-11-01 MED ORDER — METOPROLOL TARTRATE 25 MG PO TABS: 25.0000 mg | ORAL_TABLET | Freq: Two times a day (BID) | ORAL | 3 refills | Status: DC

## 2022-11-01 MED ORDER — ATORVASTATIN CALCIUM 10 MG PO TABS: 10.0000 mg | ORAL_TABLET | Freq: Every day | ORAL | 3 refills | Status: DC

## 2022-12-03 ENCOUNTER — Telehealth: Payer: Self-pay | Admitting: Cardiology

## 2022-12-03 NOTE — Telephone Encounter (Signed)
Pt c/o Shortness Of Breath: STAT if SOB developed within the last 24 hours or pt is noticeably SOB on the phone  1. Are you currently SOB (can you hear that pt is SOB on the phone)?   Yes - Oxygen saturation at rest is 93  2. How long have you been experiencing SOB?   Four day  3. Are you SOB when sitting or when up moving around?   When up and moving around  4. Are you currently experiencing any other symptoms?   Caller stated patient has some weakness and swelling in her legs.  Patient recently returned from a trip to the beach.  Caller stated patient's last weight was 176 lbs.  Caller stated patient is just not feeling like herself.

## 2022-12-03 NOTE — Telephone Encounter (Signed)
Spoke with pt, she is SOB with little exertion. She indorses swelling in her feet and ankles. She was recently at the beach and may have missed some doses of furosemide but she is not sure. She endorses a dry cough that is non-productive. Patient instructed to take furosemide 80 mg either once daily or 40 mg twice daily for the next 3 days. She was also instructed to keep fluid intake to 1 liter total for the day. Patient voiced understanding to make sure to contact the office after 3 days if no improvement in her symptoms.

## 2023-01-29 ENCOUNTER — Other Ambulatory Visit: Payer: Self-pay | Admitting: Cardiology

## 2023-03-10 ENCOUNTER — Other Ambulatory Visit: Payer: Self-pay | Admitting: Cardiology

## 2023-03-10 DIAGNOSIS — I4891 Unspecified atrial fibrillation: Secondary | ICD-10-CM

## 2023-03-10 NOTE — Telephone Encounter (Addendum)
Xarelto 15mg  refill request received. Pt is 89 years old, weight-77.9kg, Crea-1.00 on 02/06/21 via scanned labs from PCP, last seen by Dr. [place] on 11/01/22, Diagnosis-Afib, CrCl-45.06 ; Dose is appropriate based on dosing criteria.   Pt needs updated labs. Left a message for PCP regarding labs.  Labs received from PCP and Crea-0.90 on 09/10/22; CrCl-50.63ml/min. PharmD Thayer Ohm advised advised to continue current xarelto 15mg  dose.

## 2023-03-19 DIAGNOSIS — E559 Vitamin D deficiency, unspecified: Secondary | ICD-10-CM | POA: Diagnosis not present

## 2023-03-19 DIAGNOSIS — N1831 Chronic kidney disease, stage 3a: Secondary | ICD-10-CM | POA: Diagnosis not present

## 2023-03-19 DIAGNOSIS — E785 Hyperlipidemia, unspecified: Secondary | ICD-10-CM | POA: Diagnosis not present

## 2023-03-19 DIAGNOSIS — I5032 Chronic diastolic (congestive) heart failure: Secondary | ICD-10-CM | POA: Diagnosis not present

## 2023-03-19 DIAGNOSIS — E039 Hypothyroidism, unspecified: Secondary | ICD-10-CM | POA: Diagnosis not present

## 2023-03-19 DIAGNOSIS — D692 Other nonthrombocytopenic purpura: Secondary | ICD-10-CM | POA: Diagnosis not present

## 2023-03-19 DIAGNOSIS — I130 Hypertensive heart and chronic kidney disease with heart failure and stage 1 through stage 4 chronic kidney disease, or unspecified chronic kidney disease: Secondary | ICD-10-CM | POA: Diagnosis not present

## 2023-03-25 DIAGNOSIS — I10 Essential (primary) hypertension: Secondary | ICD-10-CM | POA: Diagnosis not present

## 2023-03-25 DIAGNOSIS — E785 Hyperlipidemia, unspecified: Secondary | ICD-10-CM | POA: Diagnosis not present

## 2023-03-25 DIAGNOSIS — I5032 Chronic diastolic (congestive) heart failure: Secondary | ICD-10-CM | POA: Diagnosis not present

## 2023-03-25 DIAGNOSIS — E039 Hypothyroidism, unspecified: Secondary | ICD-10-CM | POA: Diagnosis not present

## 2023-03-25 DIAGNOSIS — R82998 Other abnormal findings in urine: Secondary | ICD-10-CM | POA: Diagnosis not present

## 2023-03-25 DIAGNOSIS — Z1339 Encounter for screening examination for other mental health and behavioral disorders: Secondary | ICD-10-CM | POA: Diagnosis not present

## 2023-03-25 DIAGNOSIS — I4891 Unspecified atrial fibrillation: Secondary | ICD-10-CM | POA: Diagnosis not present

## 2023-03-25 DIAGNOSIS — D6869 Other thrombophilia: Secondary | ICD-10-CM | POA: Diagnosis not present

## 2023-03-25 DIAGNOSIS — D692 Other nonthrombocytopenic purpura: Secondary | ICD-10-CM | POA: Diagnosis not present

## 2023-03-25 DIAGNOSIS — E559 Vitamin D deficiency, unspecified: Secondary | ICD-10-CM | POA: Diagnosis not present

## 2023-03-25 DIAGNOSIS — Z0000 Encounter for general adult medical examination without abnormal findings: Secondary | ICD-10-CM | POA: Diagnosis not present

## 2023-03-25 DIAGNOSIS — N1831 Chronic kidney disease, stage 3a: Secondary | ICD-10-CM | POA: Diagnosis not present

## 2023-03-25 DIAGNOSIS — I130 Hypertensive heart and chronic kidney disease with heart failure and stage 1 through stage 4 chronic kidney disease, or unspecified chronic kidney disease: Secondary | ICD-10-CM | POA: Diagnosis not present

## 2023-03-25 DIAGNOSIS — Z1331 Encounter for screening for depression: Secondary | ICD-10-CM | POA: Diagnosis not present

## 2023-04-22 DIAGNOSIS — H52203 Unspecified astigmatism, bilateral: Secondary | ICD-10-CM | POA: Diagnosis not present

## 2023-04-22 DIAGNOSIS — Z961 Presence of intraocular lens: Secondary | ICD-10-CM | POA: Diagnosis not present

## 2023-05-06 NOTE — Progress Notes (Signed)
Cheyenne Cruz Date of Birth: April 07, 1929 Medical Record #[id number]  History of Present Illness: [name] is seen for follow up of atrial fibrillation and CHF. She was seen in 2011 with symptoms of dyspnea. She had an Echo at that time that showed Normal LV function, AV sclerosis, and mild MR. A stress test showed very poor exercise tolerance. She had a cardiac cath that showed no CAD and normal right heart pressures.She had pulmonary evaluation at that time with Dr. Shelle Iron with normal PFTs.  In 2015 she was found to be in atrial fibrillation.  She was asymptomatic.   Started on metoprolol and Xarelto. She later developed findings of diastolic CHF. She was placed on lasix with improvement.   She was seen by Dr Renato Gails on 10/18/16 and noted to have weight gain, increased edema and marked increase in SOB. Lasix increased to 40 mg bid. She was seen and noted that she could not do anything due to SOB and marked fatigue. No cough or chest pain. Marked edema. When in our office lasix increased further to 60 mg bid and potassium added.  Echo performed on 11/07/16 showed low normal LV function with moderate biatrial enlargment. No significant valvular disease. When seen in March 2018 she was doing much better with a 12 lb weight loss. Metolazone was changed to prn. Home oxygen was discontinued.   On follow up today she is doing well.  She walks with a walker. Weight is stable.  No SOB or chest pain. Not aware of afib. Still has some swelling in legs R>L but no change.   Allergies as of 05/12/2023   No Known Allergies      Medication List        Accurate as of May 06, 2023 11:48 AM. If you have any questions, ask your nurse or doctor.          acetaminophen 325 MG tablet Commonly known as: TYLENOL Take 650 mg by mouth every 6 (six) hours as needed.   atorvastatin 10 MG tablet Commonly known as: LIPITOR Take 1 tablet (10 mg total) by mouth daily.   calcium carbonate 600 MG Tabs  tablet Commonly known as: OS-CAL Take 600 mg by mouth. Take one tablet twice daily   Fluad Quadrivalent 0.5 ML injection Generic drug: influenza vaccine adjuvanted Inject into the muscle.   furosemide 40 MG tablet Commonly known as: LASIX TAKE ONE TABLET DAILY   levothyroxine 50 MCG tablet Commonly known as: SYNTHROID Take 1 tablet (50 mcg total) by mouth daily.   metoprolol tartrate 25 MG tablet Commonly known as: LOPRESSOR Take 1 tablet (25 mg total) by mouth 2 (two) times daily.   multivitamin tablet Take 1 tablet by mouth daily. Take 1 tablet once a day.   Vitamin D-3 25 MCG (1000 UT) Caps Take 1 capsule by mouth daily.   Xarelto 15 MG Tabs tablet Generic drug: Rivaroxaban TAKE ONE TABLET EVERY DAY with SUPPER        No Known Allergies  Past Medical History:  Diagnosis Date   Abnormality of gait 10/09/2008   Aortic valve sclerosis    Atrial fibrillation, persistent (HCC)    Closed fracture of shaft of femur (HCC) 11.14/2012   Enthesopathy of unspecified site 03/19/2009   Fatigue    H/O total hysterectomy    Hyperlipidemia    Hypertension    MVP (mitral valve prolapse)    Myalgia and myositis, unspecified 08/06/2010   Nonspecific abnormal results of pulmonary system function  study 08/06/2010   Nontoxic uninodular goiter 08/06/2010   OA (osteoarthritis)    Obesity    Osteoporosis, senile 08/15/2004   Other abnormal blood chemistry 06/25/2010   Overweight and obesity(278.0) 11/11/2012   Pain in joint, ankle and foot 12/28/2008   Plantar fasciitis    Shortness of breath    WITH EXERTION   Thyroid disease    Unspecified asthma, with exacerbation 12/02/2005   Unspecified essential hypertension 08/15/2004   Unspecified hypothyroidism 08/06/2010   Vitamin D deficiency     Past Surgical History:  Procedure Laterality Date   ABDOMINAL HYSTERECTOMY  2001   Total Dr. Beather Arbour   BASAL CELL CARCINOMA EXCISION  2006   Above left side of lip   CATARACT  EXTRACTION W/ INTRAOCULAR LENS  IMPLANT, BILATERAL  08/2015   Dr. Liberty Handy   COLONOSCOPY  09/27/2008   Dr. Sherin Quarry normal   FOOT FRACTURE  1976   FRACTURE SURGERY  1976   Foot   FRACTURE SURGERY Left 2012   hip ORIF   SKIN CANCER EXCISION  2003   NOSE   TONSILLECTOMY      Social History   Socioeconomic History   Marital status: Married    Spouse name: Not on file   Number of children: Not on file   Years of education: Not on file   Highest education level: Not on file  Occupational History   Occupation: RETIRED tearcher    Comment: ELEMENTARY SCHOOL TEACHER  Tobacco Use   Smoking status: Never   Smokeless tobacco: Never  Vaping Use   Vaping status: Never Used  Substance and Sexual Activity   Alcohol use: Yes    Alcohol/week: 3.0 - 4.0 standard drinks of alcohol    Types: 3 - 4 Glasses of wine per week    Comment: OCCASIONAL GLASS OF WINE   Drug use: No   Sexual activity: Not Currently  Other Topics Concern   Not on file  Social History Narrative   Married 1954, Cheyenne Cruz.    Retired Runner, broadcasting/film/video.    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 Spends summers in [place].    No smoking history    drinks minimal amount of alcohol.   Exercise predominantly weight training, walking dog   DNR   Social Determinants of Health   Financial Resource Strain: Not on file  Food Insecurity: Not on file  Transportation Needs: Not on file  Physical Activity: Not on file  Stress: Not on file  Social Connections: Not on file    Family History  Problem Relation Age of Onset   Stroke Mother 78       FOLLOWING A BROKEN HIP   Stroke Father    Aneurysm Father        Abdominal aortic, repaired    Review of Systems: As noted in HPI.  All other systems were reviewed and are negative.  Physical Exam: There were no vitals taken for this visit. There were no vitals filed for this visit.  GENERAL:  Well appearing WF in NAD HEENT:  PERRL, EOMI,  sclera are clear. Oropharynx is clear. NECK:  No jugular venous distention, carotid upstroke brisk and symmetric, no bruits, no thyromegaly or adenopathy LUNGS:  Clear to auscultation bilaterally CHEST:  Unremarkable HEART:  IRRR,  PMI not displaced or sustained,S1 and S2 within normal limits, no S3, no S4: no clicks, no rubs, no murmurs ABD:  Soft, nontender. BS +, no masses or bruits. No hepatomegaly, no  splenomegaly EXT:  2 + pulses throughout, 1-2+ right ankle edema, tr-1+ on left. no cyanosis no clubbing SKIN:  Warm and dry.  No rashes NEURO:  Alert and oriented x 3. Cranial nerves II through XII intact. PSYCH:  Cognitively intact     LABORATORY DATA: Lab Results  Component Value Date   WBC 6.6 09/13/2016   HGB 12.2 09/13/2016   HCT 36.0 09/13/2016   PLT 290 09/13/2016   GLUCOSE 142 (H) 11/04/2016   CHOL 178 05/07/2016   TRIG 85 05/07/2016   HDL 87 (A) 05/07/2016   LDLCALC 74 05/07/2016   ALT 15 06/13/2015   AST 19 06/13/2015   NA 140 11/04/2016   K 4.3 11/04/2016   CL 98 11/04/2016   CREATININE 0.87 11/04/2016   BUN 18 11/04/2016   CO2 32 (H) 11/04/2016   TSH 3.63 05/07/2016   HGBA1C 6.0 05/07/2016   Labs dated 01/01/17: cholesterol 181, triglycerides 83, HDL 55, LDL 109. CMET, TSH, and CBC normal.  Dated 01/05/18: cholesterol 186, triglycerides 71, HDL 61, LDL 111. Creatinine 1.1. other chemistries and CBC normal Dated 01/12/19: cholesterol 195, triglycerides 69, HDL 63, LDL 118. A1c 5,6%, creatinine 1.1. otherwise CBC, CMET and TSH normal.  Dated 01/25/20: A1c 5.5%. cholesterol 184, triglycerides 104, HDL 67, LDL 96. Creatinine 1.1. otherwise CBC, CMET and TFTs normal. Dated 02/06/21: cholesterol 180, triglycerides 83, HDL 76, LDL 87.  Dated 05/25/21: CMET normal Dated 03/01/22: A1c 5.6%. cholesterol 182, triglycerides 79, HDL 82, LDL 84. Creatinine 1.1. otherwise  CBC, CMET, TFTs normal. Dated 03/19/23: cholesterol 186, triglycerides 76, HDL 69, LDL 102. A1c 5.6%. CBC,CMET  and TSH normal    Echo 11/07/16: Study Conclusions   - Left ventricle: The cavity size was normal. Wall thickness was   normal. Systolic function was normal. The estimated ejection   fraction was in the range of 50% to 55%. Wall motion was normal;   there were no regional wall motion abnormalities. - Aortic valve: There was trivial regurgitation. - Mitral valve: Systolic bowing without prolapse. - Left atrium: The atrium was moderately dilated. - Right atrium: The atrium was moderately dilated. - Atrial septum: No defect or patent foramen ovale was identified.   Assessment / Plan: 1. Atrial fibrillation chronic.  Rate is  well controlled on metoprolol.  Anticoagulated with Xarelto with elevated CHAD-vasc score of 4.   2. HTN controlled. On metoprolol and lasix.  3.  Chronic diastolic CHF. She appears euvolemic today.  Weight is stable.    She has chronic class 1 symptoms. Edema is unchanged.  Continue current lasix dose.  Sodium restriction.   4. Hypercholesterolemia. Adequate for age. LDL 102. Continue current statin dose.   I will follow up in 6 months

## 2023-05-12 ENCOUNTER — Ambulatory Visit: Payer: Medicare Other | Attending: Cardiology | Admitting: Cardiology

## 2023-05-12 ENCOUNTER — Encounter: Payer: Self-pay | Admitting: Cardiology

## 2023-05-12 VITALS — BP 136/80 | HR 95 | Ht 62.0 in | Wt 172.0 lb

## 2023-05-12 DIAGNOSIS — I10 Essential (primary) hypertension: Secondary | ICD-10-CM | POA: Diagnosis not present

## 2023-05-12 DIAGNOSIS — I482 Chronic atrial fibrillation, unspecified: Secondary | ICD-10-CM | POA: Diagnosis not present

## 2023-05-12 DIAGNOSIS — I5032 Chronic diastolic (congestive) heart failure: Secondary | ICD-10-CM | POA: Diagnosis not present

## 2023-05-12 NOTE — Patient Instructions (Signed)
Medication Instructions:  Continue same medications *If you need a refill on your cardiac medications before your next appointment, please call your pharmacy*   Lab Work: None ordered    Testing/Procedures: None ordered   Follow-Up: At [facility], you and your health needs are our priority.  As part of our continuing mission to provide you with exceptional heart care, we have created designated Provider Care Teams.  These Care Teams include your primary Cardiologist (physician) and Advanced Practice Providers (APPs -  Physician Assistants and Nurse Practitioners) who all work together to provide you with the care you need, when you need it.  We recommend signing up for the patient portal called "MyChart".  Sign up information is provided on this After Visit Summary.  MyChart is used to connect with patients for Virtual Visits (Telemedicine).  Patients are able to view lab/test results, encounter notes, upcoming appointments, etc.  Non-urgent messages can be sent to your provider as well.   To learn more about what you can do with MyChart, go to [email or web address].    Your next appointment:  6 months    Provider:  Dr.Jordan   

## 2023-05-15 DIAGNOSIS — Z85828 Personal history of other malignant neoplasm of skin: Secondary | ICD-10-CM | POA: Diagnosis not present

## 2023-05-15 DIAGNOSIS — L720 Epidermal cyst: Secondary | ICD-10-CM | POA: Diagnosis not present

## 2023-05-15 DIAGNOSIS — L570 Actinic keratosis: Secondary | ICD-10-CM | POA: Diagnosis not present

## 2023-05-15 DIAGNOSIS — L821 Other seborrheic keratosis: Secondary | ICD-10-CM | POA: Diagnosis not present

## 2023-05-22 DIAGNOSIS — Z23 Encounter for immunization: Secondary | ICD-10-CM | POA: Diagnosis not present

## 2023-07-22 DIAGNOSIS — D485 Neoplasm of uncertain behavior of skin: Secondary | ICD-10-CM | POA: Diagnosis not present

## 2023-07-22 DIAGNOSIS — L570 Actinic keratosis: Secondary | ICD-10-CM | POA: Diagnosis not present

## 2023-07-22 DIAGNOSIS — D1801 Hemangioma of skin and subcutaneous tissue: Secondary | ICD-10-CM | POA: Diagnosis not present

## 2023-07-22 DIAGNOSIS — Z85828 Personal history of other malignant neoplasm of skin: Secondary | ICD-10-CM | POA: Diagnosis not present

## 2023-07-22 DIAGNOSIS — L821 Other seborrheic keratosis: Secondary | ICD-10-CM | POA: Diagnosis not present

## 2023-07-22 DIAGNOSIS — L814 Other melanin hyperpigmentation: Secondary | ICD-10-CM | POA: Diagnosis not present

## 2023-09-29 ENCOUNTER — Other Ambulatory Visit: Payer: Self-pay | Admitting: Cardiology

## 2023-09-29 DIAGNOSIS — R7301 Impaired fasting glucose: Secondary | ICD-10-CM | POA: Diagnosis not present

## 2023-09-29 DIAGNOSIS — I10 Essential (primary) hypertension: Secondary | ICD-10-CM | POA: Diagnosis not present

## 2023-09-29 DIAGNOSIS — E785 Hyperlipidemia, unspecified: Secondary | ICD-10-CM | POA: Diagnosis not present

## 2023-09-29 DIAGNOSIS — E559 Vitamin D deficiency, unspecified: Secondary | ICD-10-CM | POA: Diagnosis not present

## 2023-09-29 DIAGNOSIS — I5032 Chronic diastolic (congestive) heart failure: Secondary | ICD-10-CM | POA: Diagnosis not present

## 2023-09-29 DIAGNOSIS — E039 Hypothyroidism, unspecified: Secondary | ICD-10-CM | POA: Diagnosis not present

## 2023-09-29 DIAGNOSIS — I4891 Unspecified atrial fibrillation: Secondary | ICD-10-CM | POA: Diagnosis not present

## 2023-09-29 DIAGNOSIS — D692 Other nonthrombocytopenic purpura: Secondary | ICD-10-CM | POA: Diagnosis not present

## 2023-09-29 DIAGNOSIS — N1831 Chronic kidney disease, stage 3a: Secondary | ICD-10-CM | POA: Diagnosis not present

## 2023-09-29 DIAGNOSIS — D6869 Other thrombophilia: Secondary | ICD-10-CM | POA: Diagnosis not present

## 2023-09-29 DIAGNOSIS — G4700 Insomnia, unspecified: Secondary | ICD-10-CM | POA: Diagnosis not present

## 2023-09-29 DIAGNOSIS — I130 Hypertensive heart and chronic kidney disease with heart failure and stage 1 through stage 4 chronic kidney disease, or unspecified chronic kidney disease: Secondary | ICD-10-CM | POA: Diagnosis not present

## 2023-09-29 NOTE — Telephone Encounter (Signed)
 Prescription refill request for Xarelto  received.  Indication:afib Last office visit:9/24 Weight:78  kg Age:89 ZOX:WRUEA labs CrCl:needs labs  Prescription refilled

## 2023-10-15 DIAGNOSIS — Z85828 Personal history of other malignant neoplasm of skin: Secondary | ICD-10-CM | POA: Diagnosis not present

## 2023-10-15 DIAGNOSIS — L570 Actinic keratosis: Secondary | ICD-10-CM | POA: Diagnosis not present

## 2023-11-02 NOTE — Progress Notes (Unsigned)
 Cheyenne Cruz Date of Birth: March 18, 1929 Medical Record #[id number]  History of Present Illness: Cheyenne Cruz is seen for follow up of atrial fibrillation and CHF. She was seen in 2011 with symptoms of dyspnea. She had an Echo at that time that showed Normal LV function, AV sclerosis, and mild MR. A stress test showed very poor exercise tolerance. She had a cardiac cath that showed no CAD and normal right heart pressures.She had pulmonary evaluation at that time with Dr. Shelle Iron with normal PFTs.  In 2015 she was found to be in atrial fibrillation.  She was asymptomatic.   Started on metoprolol and Xarelto. She later developed findings of diastolic CHF. She was placed on lasix with improvement.   She was seen by Dr Renato Gails on 10/18/16 and noted to have weight gain, increased edema and marked increase in SOB. Lasix increased to 40 mg bid. She was seen and noted that she could not do anything due to SOB and marked fatigue. No cough or chest pain. Marked edema. When in our office lasix increased further to 60 mg bid and potassium added.  Echo performed on 11/07/16 showed low normal LV function with moderate biatrial enlargment. No significant valvular disease. When seen in March 2018 she was doing much better with a 12 lb weight loss. Metolazone was changed to prn. Home oxygen was discontinued.   On follow up today she is doing well.  She walks with a walker. Weight is stable.  No SOB or chest pain. Not aware of afib. Still has some swelling in legs R>L but no change.   Allergies as of 11/07/2023   No Known Allergies      Medication List        Accurate as of November 02, 2023  2:18 PM. If you have any questions, ask your nurse or doctor.          acetaminophen 325 MG tablet Commonly known as: TYLENOL Take 650 mg by mouth every 6 (six) hours as needed.   atorvastatin 10 MG tablet Commonly known as: LIPITOR Take 1 tablet (10 mg total) by mouth daily.   calcium carbonate 600 MG Tabs  tablet Commonly known as: OS-CAL Take 600 mg by mouth. Take one tablet twice daily   Fluad Quadrivalent 0.5 ML injection Generic drug: influenza vaccine adjuvanted Inject into the muscle.   furosemide 40 MG tablet Commonly known as: LASIX TAKE ONE TABLET DAILY   levothyroxine 50 MCG tablet Commonly known as: SYNTHROID Take 1 tablet (50 mcg total) by mouth daily.   metoprolol tartrate 25 MG tablet Commonly known as: LOPRESSOR Take 1 tablet (25 mg total) by mouth 2 (two) times daily.   multivitamin tablet Take 1 tablet by mouth daily. Take 1 tablet once a day.   Rivaroxaban 15 MG Tabs tablet Commonly known as: Xarelto Take 1 tablet (15 mg total) by mouth daily with supper. Needs labs for Xarelto Refills   Vitamin D-3 25 MCG (1000 UT) Caps Take 1 capsule by mouth daily.        No Known Allergies  Past Medical History:  Diagnosis Date   Abnormality of gait 10/09/2008   Aortic valve sclerosis    Atrial fibrillation, persistent (HCC)    Closed fracture of shaft of femur (HCC) 11.14/2012   Enthesopathy of unspecified site 03/19/2009   Fatigue    H/O total hysterectomy    Hyperlipidemia    Hypertension    MVP (mitral valve prolapse)    Myalgia and myositis,  unspecified 08/06/2010   Nonspecific abnormal results of pulmonary system function study 08/06/2010   Nontoxic uninodular goiter 08/06/2010   OA (osteoarthritis)    Obesity    Osteoporosis, senile 08/15/2004   Other abnormal blood chemistry 06/25/2010   Overweight and obesity(278.0) 11/11/2012   Pain in joint, ankle and foot 12/28/2008   Plantar fasciitis    Shortness of breath    WITH EXERTION   Thyroid disease    Unspecified asthma, with exacerbation 12/02/2005   Unspecified essential hypertension 08/15/2004   Unspecified hypothyroidism 08/06/2010   Vitamin D deficiency     Past Surgical History:  Procedure Laterality Date   ABDOMINAL HYSTERECTOMY  2001   Total Dr. Beather Arbour   BASAL CELL CARCINOMA  EXCISION  2006   Above left side of lip   CATARACT EXTRACTION W/ INTRAOCULAR LENS  IMPLANT, BILATERAL  08/2015   Dr. Liberty Handy   COLONOSCOPY  09/27/2008   Dr. Sherin Quarry normal   FOOT FRACTURE  1976   FRACTURE SURGERY  1976   Foot   FRACTURE SURGERY Left 2012   hip ORIF   SKIN CANCER EXCISION  2003   NOSE   TONSILLECTOMY      Social History   Socioeconomic History   Marital status: Married    Spouse name: Not on file   Number of children: Not on file   Years of education: Not on file   Highest education level: Not on file  Occupational History   Occupation: RETIRED tearcher    Comment: ELEMENTARY SCHOOL TEACHER  Tobacco Use   Smoking status: Never   Smokeless tobacco: Never  Vaping Use   Vaping status: Never Used  Substance and Sexual Activity   Alcohol use: Yes    Alcohol/week: 3.0 - 4.0 standard drinks of alcohol    Types: 3 - 4 Glasses of wine per week    Comment: OCCASIONAL GLASS OF WINE   Drug use: No   Sexual activity: Not Currently  Other Topics Concern   Not on file  Social History Narrative   Married 1954, Chrissie Noa.    Retired Runner, broadcasting/film/video.    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 Spends summers in [place].    No smoking history    drinks minimal amount of alcohol.   Exercise predominantly weight training, walking dog   DNR   Social Drivers of Health   Financial Resource Strain: Not on file  Food Insecurity: Not on file  Transportation Needs: Not on file  Physical Activity: Not on file  Stress: Not on file  Social Connections: Not on file    Family History  Problem Relation Age of Onset   Stroke Mother 53       FOLLOWING A BROKEN HIP   Stroke Father    Aneurysm Father        Abdominal aortic, repaired    Review of Systems: As noted in HPI.  All other systems were reviewed and are negative.  Physical Exam: There were no vitals taken for this visit. There were no vitals filed for this visit.  GENERAL:   Well appearing WF in NAD HEENT:  PERRL, EOMI, sclera are clear. Oropharynx is clear. NECK:  No jugular venous distention, carotid upstroke brisk and symmetric, no bruits, no thyromegaly or adenopathy LUNGS:  Clear to auscultation bilaterally CHEST:  Unremarkable HEART:  IRRR,  PMI not displaced or sustained,S1 and S2 within normal limits, no S3, no S4: no clicks, no rubs, no murmurs ABD:  Soft, nontender. BS +, no masses or bruits. No hepatomegaly, no splenomegaly EXT:  2 + pulses throughout, 1-2+ right ankle edema, tr-1+ on left. no cyanosis no clubbing SKIN:  Warm and dry.  No rashes NEURO:  Alert and oriented x 3. Cranial nerves II through XII intact. PSYCH:  Cognitively intact     LABORATORY DATA: Lab Results  Component Value Date   WBC 6.6 09/13/2016   HGB 12.2 09/13/2016   HCT 36.0 09/13/2016   PLT 290 09/13/2016   GLUCOSE 142 (H) 11/04/2016   CHOL 178 05/07/2016   TRIG 85 05/07/2016   HDL 87 (A) 05/07/2016   LDLCALC 74 05/07/2016   ALT 15 06/13/2015   AST 19 06/13/2015   NA 140 11/04/2016   K 4.3 11/04/2016   CL 98 11/04/2016   CREATININE 0.87 11/04/2016   BUN 18 11/04/2016   CO2 32 (H) 11/04/2016   TSH 3.63 05/07/2016   HGBA1C 6.0 05/07/2016   Labs dated 01/01/17: cholesterol 181, triglycerides 83, HDL 55, LDL 109. CMET, TSH, and CBC normal.  Dated 01/05/18: cholesterol 186, triglycerides 71, HDL 61, LDL 111. Creatinine 1.1. other chemistries and CBC normal Dated 01/12/19: cholesterol 195, triglycerides 69, HDL 63, LDL 118. A1c 5,6%, creatinine 1.1. otherwise CBC, CMET and TSH normal.  Dated 01/25/20: A1c 5.5%. cholesterol 184, triglycerides 104, HDL 67, LDL 96. Creatinine 1.1. otherwise CBC, CMET and TFTs normal. Dated 02/06/21: cholesterol 180, triglycerides 83, HDL 76, LDL 87.  Dated 05/25/21: CMET normal Dated 03/01/22: A1c 5.6%. cholesterol 182, triglycerides 79, HDL 82, LDL 84. Creatinine 1.1. otherwise  CBC, CMET, TFTs normal. Dated 03/19/23: cholesterol 186,  triglycerides 76, HDL 69, LDL 102. A1c 5.6%. CBC,CMET and TSH normal  Dated 09/28/22 normal CBC, Chemistries, TFTs  Echo 11/07/16: Study Conclusions   - Left ventricle: The cavity size was normal. Wall thickness was   normal. Systolic function was normal. The estimated ejection   fraction was in the range of 50% to 55%. Wall motion was normal;   there were no regional wall motion abnormalities. - Aortic valve: There was trivial regurgitation. - Mitral valve: Systolic bowing without prolapse. - Left atrium: The atrium was moderately dilated. - Right atrium: The atrium was moderately dilated. - Atrial septum: No defect or patent foramen ovale was identified.   Assessment / Plan: 1. Atrial fibrillation chronic.  Rate is  well controlled on metoprolol.  Anticoagulated with Xarelto with elevated CHAD-vasc score of 4.   2. HTN controlled. On metoprolol and lasix.  3.  Chronic diastolic CHF. She appears euvolemic today.  Weight is stable.    She has chronic class 1 symptoms. Edema is unchanged.  Continue current lasix dose.  Sodium restriction.   4. Hypercholesterolemia. Adequate for age. LDL 102. Continue current statin dose.   I will follow up in 6 months

## 2023-11-05 ENCOUNTER — Other Ambulatory Visit: Payer: Self-pay | Admitting: Cardiology

## 2023-11-07 ENCOUNTER — Encounter: Payer: Self-pay | Admitting: Cardiology

## 2023-11-07 ENCOUNTER — Ambulatory Visit: Payer: Medicare Other | Attending: Cardiology | Admitting: Cardiology

## 2023-11-07 VITALS — BP 128/70 | HR 45 | Ht 62.0 in | Wt 175.0 lb

## 2023-11-07 DIAGNOSIS — I5030 Unspecified diastolic (congestive) heart failure: Secondary | ICD-10-CM | POA: Diagnosis not present

## 2023-11-07 DIAGNOSIS — I4891 Unspecified atrial fibrillation: Secondary | ICD-10-CM

## 2023-11-07 DIAGNOSIS — I10 Essential (primary) hypertension: Secondary | ICD-10-CM | POA: Diagnosis not present

## 2023-11-07 MED ORDER — METOPROLOL SUCCINATE ER 25 MG PO TB24: 25.0000 mg | ORAL_TABLET | Freq: Every day | ORAL | 3 refills | Status: AC

## 2023-11-07 MED ORDER — ATORVASTATIN CALCIUM 10 MG PO TABS: 10.0000 mg | ORAL_TABLET | Freq: Every day | ORAL | 3 refills | Status: AC

## 2023-11-07 NOTE — Patient Instructions (Signed)
 Medication Instructions:  Decrease Metoprolol Succinate 25 mg daily   Continue all other medications *If you need a refill on your cardiac medications before your next appointment, please call your pharmacy*   Lab Work: None ordered   Testing/Procedures: None ordered   Follow-Up: At [facility], you and your health needs are our priority.  As part of our continuing mission to provide you with exceptional heart care, we have created designated Provider Care Teams.  These Care Teams include your primary Cardiologist (physician) and Advanced Practice Providers (APPs -  Physician Assistants and Nurse Practitioners) who all work together to provide you with the care you need, when you need it.  We recommend signing up for the patient portal called "MyChart".  Sign up information is provided on this After Visit Summary.  MyChart is used to connect with patients for Virtual Visits (Telemedicine).  Patients are able to view lab/test results, encounter notes, upcoming appointments, etc.  Non-urgent messages can be sent to your provider as well.   To learn more about what you can do with MyChart, go to [email or web address].    Your next appointment:  6 months   Call in June to schedule Sept appointment     Provider:  Dr.Jordan

## 2023-12-18 DIAGNOSIS — M479 Spondylosis, unspecified: Secondary | ICD-10-CM | POA: Diagnosis not present

## 2023-12-18 DIAGNOSIS — N1831 Chronic kidney disease, stage 3a: Secondary | ICD-10-CM | POA: Diagnosis not present

## 2023-12-18 DIAGNOSIS — M8580 Other specified disorders of bone density and structure, unspecified site: Secondary | ICD-10-CM | POA: Diagnosis not present

## 2023-12-18 DIAGNOSIS — M546 Pain in thoracic spine: Secondary | ICD-10-CM | POA: Diagnosis not present

## 2023-12-18 DIAGNOSIS — G8929 Other chronic pain: Secondary | ICD-10-CM | POA: Diagnosis not present

## 2023-12-18 DIAGNOSIS — M545 Low back pain, unspecified: Secondary | ICD-10-CM | POA: Diagnosis not present

## 2023-12-18 DIAGNOSIS — I4891 Unspecified atrial fibrillation: Secondary | ICD-10-CM | POA: Diagnosis not present

## 2023-12-18 DIAGNOSIS — I129 Hypertensive chronic kidney disease with stage 1 through stage 4 chronic kidney disease, or unspecified chronic kidney disease: Secondary | ICD-10-CM | POA: Diagnosis not present

## 2023-12-19 ENCOUNTER — Other Ambulatory Visit: Payer: Self-pay | Admitting: Nurse Practitioner

## 2023-12-19 DIAGNOSIS — M545 Low back pain, unspecified: Secondary | ICD-10-CM

## 2023-12-19 DIAGNOSIS — M479 Spondylosis, unspecified: Secondary | ICD-10-CM

## 2024-01-05 ENCOUNTER — Ambulatory Visit
Admission: RE | Admit: 2024-01-05 | Discharge: 2024-01-05 | Disposition: A | Discharge status: Home / Self Care | Source: Ambulatory Visit | Attending: Nurse Practitioner | Admitting: Nurse Practitioner

## 2024-01-05 DIAGNOSIS — M4316 Spondylolisthesis, lumbar region: Secondary | ICD-10-CM | POA: Diagnosis not present

## 2024-01-05 DIAGNOSIS — M545 Low back pain, unspecified: Secondary | ICD-10-CM

## 2024-01-05 DIAGNOSIS — M47816 Spondylosis without myelopathy or radiculopathy, lumbar region: Secondary | ICD-10-CM | POA: Diagnosis not present

## 2024-01-05 DIAGNOSIS — M479 Spondylosis, unspecified: Secondary | ICD-10-CM

## 2024-01-09 ENCOUNTER — Other Ambulatory Visit: Payer: Self-pay | Admitting: Cardiology

## 2024-01-09 DIAGNOSIS — I4891 Unspecified atrial fibrillation: Secondary | ICD-10-CM

## 2024-01-09 NOTE — Telephone Encounter (Signed)
 Xarelto  15mg  refill request received. Pt is 89 years old, weight-79.4kg, Crea-1.1 on 09/29/23 via Care Everywhere and faxed labs-being sent per medical records dept last seen by Dr. [place] on 11/07/23, Diagnosis-Afib, CrCl-40.53 mL/min; Dose is appropriate based on dosing criteria.

## 2024-02-09 DIAGNOSIS — M47816 Spondylosis without myelopathy or radiculopathy, lumbar region: Secondary | ICD-10-CM | POA: Diagnosis not present

## 2024-02-09 DIAGNOSIS — G8929 Other chronic pain: Secondary | ICD-10-CM | POA: Diagnosis not present

## 2024-02-12 DIAGNOSIS — C44329 Squamous cell carcinoma of skin of other parts of face: Secondary | ICD-10-CM | POA: Diagnosis not present

## 2024-02-12 DIAGNOSIS — Z85828 Personal history of other malignant neoplasm of skin: Secondary | ICD-10-CM | POA: Diagnosis not present

## 2024-03-04 ENCOUNTER — Other Ambulatory Visit: Payer: Self-pay | Admitting: Cardiology

## 2024-03-05 ENCOUNTER — Encounter: Payer: Self-pay | Admitting: Advanced Practice Midwife

## 2024-03-22 DIAGNOSIS — Z85828 Personal history of other malignant neoplasm of skin: Secondary | ICD-10-CM | POA: Diagnosis not present

## 2024-03-22 DIAGNOSIS — C44329 Squamous cell carcinoma of skin of other parts of face: Secondary | ICD-10-CM | POA: Diagnosis not present

## 2024-03-25 DIAGNOSIS — M8589 Other specified disorders of bone density and structure, multiple sites: Secondary | ICD-10-CM | POA: Diagnosis not present

## 2024-03-25 DIAGNOSIS — I5032 Chronic diastolic (congestive) heart failure: Secondary | ICD-10-CM | POA: Diagnosis not present

## 2024-03-25 DIAGNOSIS — E559 Vitamin D deficiency, unspecified: Secondary | ICD-10-CM | POA: Diagnosis not present

## 2024-03-25 DIAGNOSIS — E785 Hyperlipidemia, unspecified: Secondary | ICD-10-CM | POA: Diagnosis not present

## 2024-03-25 DIAGNOSIS — I130 Hypertensive heart and chronic kidney disease with heart failure and stage 1 through stage 4 chronic kidney disease, or unspecified chronic kidney disease: Secondary | ICD-10-CM | POA: Diagnosis not present

## 2024-03-25 DIAGNOSIS — E039 Hypothyroidism, unspecified: Secondary | ICD-10-CM | POA: Diagnosis not present

## 2024-03-25 DIAGNOSIS — R7301 Impaired fasting glucose: Secondary | ICD-10-CM | POA: Diagnosis not present

## 2024-03-25 DIAGNOSIS — N1831 Chronic kidney disease, stage 3a: Secondary | ICD-10-CM | POA: Diagnosis not present

## 2024-03-31 DIAGNOSIS — Z0000 Encounter for general adult medical examination without abnormal findings: Secondary | ICD-10-CM | POA: Diagnosis not present

## 2024-03-31 DIAGNOSIS — M810 Age-related osteoporosis without current pathological fracture: Secondary | ICD-10-CM | POA: Diagnosis not present

## 2024-03-31 DIAGNOSIS — R82998 Other abnormal findings in urine: Secondary | ICD-10-CM | POA: Diagnosis not present

## 2024-03-31 DIAGNOSIS — D6869 Other thrombophilia: Secondary | ICD-10-CM | POA: Diagnosis not present

## 2024-03-31 DIAGNOSIS — N1831 Chronic kidney disease, stage 3a: Secondary | ICD-10-CM | POA: Diagnosis not present

## 2024-03-31 DIAGNOSIS — E039 Hypothyroidism, unspecified: Secondary | ICD-10-CM | POA: Diagnosis not present

## 2024-03-31 DIAGNOSIS — Z1339 Encounter for screening examination for other mental health and behavioral disorders: Secondary | ICD-10-CM | POA: Diagnosis not present

## 2024-03-31 DIAGNOSIS — I5032 Chronic diastolic (congestive) heart failure: Secondary | ICD-10-CM | POA: Diagnosis not present

## 2024-03-31 DIAGNOSIS — I4891 Unspecified atrial fibrillation: Secondary | ICD-10-CM | POA: Diagnosis not present

## 2024-03-31 DIAGNOSIS — Z1331 Encounter for screening for depression: Secondary | ICD-10-CM | POA: Diagnosis not present

## 2024-03-31 DIAGNOSIS — D692 Other nonthrombocytopenic purpura: Secondary | ICD-10-CM | POA: Diagnosis not present

## 2024-03-31 DIAGNOSIS — M545 Low back pain, unspecified: Secondary | ICD-10-CM | POA: Diagnosis not present

## 2024-03-31 DIAGNOSIS — I130 Hypertensive heart and chronic kidney disease with heart failure and stage 1 through stage 4 chronic kidney disease, or unspecified chronic kidney disease: Secondary | ICD-10-CM | POA: Diagnosis not present

## 2024-03-31 DIAGNOSIS — E785 Hyperlipidemia, unspecified: Secondary | ICD-10-CM | POA: Diagnosis not present

## 2024-03-31 DIAGNOSIS — G4700 Insomnia, unspecified: Secondary | ICD-10-CM | POA: Diagnosis not present

## 2024-04-01 DIAGNOSIS — M47816 Spondylosis without myelopathy or radiculopathy, lumbar region: Secondary | ICD-10-CM | POA: Diagnosis not present

## 2024-04-15 DIAGNOSIS — M47816 Spondylosis without myelopathy or radiculopathy, lumbar region: Secondary | ICD-10-CM | POA: Diagnosis not present

## 2024-04-22 DIAGNOSIS — Z961 Presence of intraocular lens: Secondary | ICD-10-CM | POA: Diagnosis not present

## 2024-04-22 DIAGNOSIS — H02831 Dermatochalasis of right upper eyelid: Secondary | ICD-10-CM | POA: Diagnosis not present

## 2024-04-22 DIAGNOSIS — H02834 Dermatochalasis of left upper eyelid: Secondary | ICD-10-CM | POA: Diagnosis not present

## 2024-04-30 NOTE — Progress Notes (Signed)
 Cheyenne Cruz Date of Birth: June 22, 1929 Medical Record #[id number]  History of Present Illness: Cheyenne Cruz is seen for follow up of atrial fibrillation and CHF. She was seen in 2011 with symptoms of dyspnea. She had an Echo at that time that showed Normal LV function, AV sclerosis, and mild MR. A stress test showed very poor exercise tolerance. She had a cardiac cath that showed no CAD and normal right heart pressures.She had pulmonary evaluation at that time with Dr. Corrie with normal PFTs.  In 2015 she was found to be in atrial fibrillation.  She was asymptomatic.   Started on metoprolol  and Xarelto . She later developed findings of diastolic CHF. She was placed on lasix  with improvement.   She was seen by Dr Cloria on 10/18/16 and noted to have weight gain, increased edema and marked increase in SOB. Lasix  increased to 40 mg bid. She was seen and noted that she could not do anything due to SOB and marked fatigue. No cough or chest pain. Marked edema. When in our office lasix  increased further to 60 mg bid and potassium added.  Echo performed on 11/07/16 showed low normal LV function with moderate biatrial enlargment. No significant valvular disease. When seen in March 2018 she was doing much better with a 12 lb weight loss. Metolazone  was changed to prn. Home oxygen  was discontinued.   On follow up today she is doing OK. She walks with a walker. Notes she is slower now but denies any SOB, palpitations, or chest pain. Swelling is stable R>L.   Allergies as of 05/06/2024   No Known Allergies      Medication List        Accurate as of May 06, 2024 11:13 AM. If you have any questions, ask your nurse or doctor.          STOP taking these medications    Fluad  Quadrivalent 0.5 ML injection Generic drug: influenza vaccine adjuvanted Stopped by: Nhung Danko [place]       TAKE these medications    acetaminophen 325 MG tablet Commonly known as: TYLENOL Take 650 mg by mouth every 6  (six) hours as needed.   atorvastatin  10 MG tablet Commonly known as: LIPITOR Take 1 tablet (10 mg total) by mouth daily.   calcium  carbonate 600 MG Tabs tablet Commonly known as: OS-CAL Take 600 mg by mouth. Take one tablet twice daily   furosemide  40 MG tablet Commonly known as: LASIX  TAKE ONE TABLET DAILY   levothyroxine  50 MCG tablet Commonly known as: SYNTHROID  Take 1 tablet (50 mcg total) by mouth daily.   metoprolol  succinate 25 MG 24 hr tablet Commonly known as: Toprol  XL Take 1 tablet (25 mg total) by mouth daily.   multivitamin tablet Take 1 tablet by mouth daily. Take 1 tablet once a day.   Rivaroxaban  15 MG Tabs tablet Commonly known as: Xarelto  Take 1 tablet (15 mg total) by mouth daily with supper. What changed: See the new instructions. Changed by: Thais Silberstein [place]   Vitamin D-3 25 MCG (1000 UT) Caps Take 1 capsule by mouth daily.        No Known Allergies  Past Medical History:  Diagnosis Date   Abnormality of gait 10/09/2008   Aortic valve sclerosis    Atrial fibrillation, persistent (HCC)    Closed fracture of shaft of femur (HCC) 11.14/2012   Enthesopathy of unspecified site 03/19/2009   Fatigue    H/O total hysterectomy    Hyperlipidemia  Hypertension    MVP (mitral valve prolapse)    Myalgia and myositis, unspecified 08/06/2010   Nonspecific abnormal results of pulmonary system function study 08/06/2010   Nontoxic uninodular goiter 08/06/2010   OA (osteoarthritis)    Obesity    Osteoporosis, senile 08/15/2004   Other abnormal blood chemistry 06/25/2010   Overweight and obesity(278.0) 11/11/2012   Pain in joint, ankle and foot 12/28/2008   Plantar fasciitis    Shortness of breath    WITH EXERTION   Thyroid  disease    Unspecified asthma, with exacerbation 12/02/2005   Unspecified essential hypertension 08/15/2004   Unspecified hypothyroidism 08/06/2010   Vitamin D deficiency     Past Surgical History:  Procedure Laterality Date    ABDOMINAL HYSTERECTOMY  2001   Total Dr. Carlin Forbes   BASAL CELL CARCINOMA EXCISION  2006   Above left side of lip   CATARACT EXTRACTION W/ INTRAOCULAR LENS  IMPLANT, BILATERAL  08/2015   Dr. Rocky   COLONOSCOPY  09/27/2008   Dr. 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    Retired Runner, broadcasting/film/video.    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 Spends summers in [place] .    No smoking history    drinks minimal amount of alcohol .   Exercise predominantly weight training, walking dog   DNR   Social Drivers of Health   Financial Resource Strain: Not on file  Food Insecurity: Not on file  Transportation Needs: Not on file  Physical Activity: Not on file  Stress: Not on file  Social Connections: Not on file    Family History  Problem Relation Age of Onset   Stroke Mother 10       FOLLOWING A BROKEN HIP   Stroke Father    Aneurysm Father        Abdominal aortic, repaired    Review of Systems: As noted in HPI.  All other systems were reviewed and are negative.  Physical Exam: BP 137/80   Pulse 62    Ht 5' 2 (1.575 m)   Wt 173 lb (78.5 kg)   SpO2 96%   BMI 31.64 kg/m  Filed Weights   05/06/24 1046  Weight: 173 lb (78.5 kg)     GENERAL:  Well appearing WF in NAD HEENT:  PERRL, EOMI, sclera are clear. Oropharynx is clear. NECK:  No jugular venous distention, carotid upstroke brisk  and symmetric, no bruits, no thyromegaly or adenopathy LUNGS:  Clear to auscultation bilaterally CHEST:  Unremarkable HEART:  IRRR,  PMI not displaced or sustained,S1 and S2 within normal limits, no S3, no S4: no clicks, no rubs, no murmurs ABD:  Soft, nontender. BS +, no masses or bruits. No hepatomegaly, no splenomegaly EXT:  2 + pulses throughout, 1+ right ankle edema, tr on left. no cyanosis no clubbing SKIN:  Warm and dry.  No rashes NEURO:  Alert and oriented x 3. Cranial nerves II through XII intact. PSYCH:  Cognitively intact     LABORATORY DATA: Lab Results  Component Value Date   WBC 6.6 09/13/2016   HGB 12.2 09/13/2016   HCT 36.0 09/13/2016   PLT 290 09/13/2016   GLUCOSE 142 (H) 11/04/2016   CHOL 178 05/07/2016   TRIG 85 05/07/2016   HDL 87 (A) 05/07/2016   LDLCALC 74 05/07/2016   ALT 15 06/13/2015   AST 19 06/13/2015   NA 140 11/04/2016   K 4.3 11/04/2016   CL 98 11/04/2016   CREATININE 0.87 11/04/2016   BUN 18 11/04/2016   CO2 32 (H) 11/04/2016   TSH 3.63 05/07/2016   HGBA1C 6.0 05/07/2016   Labs dated 01/01/17: cholesterol 181, triglycerides 83, HDL 55, LDL 109. CMET, TSH, and CBC normal.  Dated 01/05/18: cholesterol 186, triglycerides 71, HDL 61, LDL 111. Creatinine 1.1. other chemistries and CBC normal Dated 01/12/19: cholesterol 195, triglycerides 69, HDL 63, LDL 118. A1c 5,6%, creatinine 1.1. otherwise CBC, CMET and TSH normal.  Dated 01/25/20: A1c 5.5%. cholesterol 184, triglycerides 104, HDL 67, LDL 96. Creatinine 1.1. otherwise CBC, CMET and TFTs normal. Dated 02/06/21: cholesterol 180, triglycerides 83, HDL 76, LDL 87.  Dated 05/25/21: CMET normal Dated 03/01/22: A1c  5.6%. cholesterol 182, triglycerides 79, HDL 82, LDL 84. Creatinine 1.1. otherwise  CBC, CMET, TFTs normal. Dated 03/19/23: cholesterol 186, triglycerides 76, HDL 69, LDL 102. A1c 5.6%. CBC,CMET and TSH normal  Dated 09/28/22 normal CBC, Chemistries, TFTs  Echo 11/07/16: Study Conclusions   - Left ventricle: The cavity size was normal. Wall thickness was   normal. Systolic function was normal. The estimated ejection   fraction was in the range of 50% to 55%. Wall motion was normal;   there were no regional wall motion abnormalities. - Aortic valve: There was trivial regurgitation. - Mitral valve: Systolic bowing without prolapse. - Left atrium: The atrium was moderately dilated. - Right atrium: The atrium was moderately dilated. - Atrial septum: No defect or patent foramen ovale was identified.   Assessment / Plan: 1. Atrial fibrillation chronic.  Rate in controlled on Toprol  25 mg. Anticoagulated with Xarelto  with elevated CHAD-vasc score of 4. Lower dose due to age.   2. HTN controlled. On metoprolol  and lasix .  3.  Chronic diastolic CHF. She appears euvolemic today.  Weight is stable.    She has chronic class 1 symptoms. Edema is unchanged.  Continue current lasix  dose.  Sodium restriction.   4. Hypercholesterolemia. Adequate for age. Last Apo B 76 Continue current statin dose. She does have some coronary calcification  I will follow up in 6 months

## 2024-05-06 ENCOUNTER — Encounter: Payer: Self-pay | Admitting: Cardiology

## 2024-05-06 ENCOUNTER — Ambulatory Visit: Attending: Cardiology | Admitting: Cardiology

## 2024-05-06 VITALS — BP 137/80 | HR 62 | Ht 62.0 in | Wt 173.0 lb

## 2024-05-06 DIAGNOSIS — I4891 Unspecified atrial fibrillation: Secondary | ICD-10-CM | POA: Diagnosis not present

## 2024-05-06 DIAGNOSIS — I10 Essential (primary) hypertension: Secondary | ICD-10-CM | POA: Diagnosis not present

## 2024-05-06 DIAGNOSIS — I5032 Chronic diastolic (congestive) heart failure: Secondary | ICD-10-CM | POA: Diagnosis not present

## 2024-05-06 MED ORDER — RIVAROXABAN 15 MG PO TABS: 15.0000 mg | ORAL_TABLET | Freq: Every day | ORAL | 3 refills | Status: AC

## 2024-05-06 NOTE — Patient Instructions (Signed)

## 2024-05-18 DIAGNOSIS — Z23 Encounter for immunization: Secondary | ICD-10-CM | POA: Diagnosis not present

## 2024-11-03 ENCOUNTER — Ambulatory Visit: Admitting: Cardiology
